# Patient Record
Sex: Male | Born: 1960 | Race: Black or African American | Hispanic: No | Smoking: Current every day smoker
Health system: Southern US, Community
[De-identification: ages and names within clinical notes are randomized; demographics above are authoritative.]

## PROBLEM LIST (undated history)

## (undated) DIAGNOSIS — I10 Essential (primary) hypertension: Secondary | ICD-10-CM

## (undated) DIAGNOSIS — F1910 Other psychoactive substance abuse, uncomplicated: Secondary | ICD-10-CM

## (undated) DIAGNOSIS — J939 Pneumothorax, unspecified: Secondary | ICD-10-CM

## (undated) HISTORY — DX: Pneumothorax, unspecified: J93.9

## (undated) HISTORY — DX: Other psychoactive substance abuse, uncomplicated: F19.10

## (undated) HISTORY — PX: APPENDECTOMY: SHX54

---

## 2009-09-18 ENCOUNTER — Emergency Department: Payer: Self-pay

## 2010-07-21 ENCOUNTER — Emergency Department: Payer: Self-pay | Admitting: Emergency Medicine

## 2012-12-19 ENCOUNTER — Emergency Department: Payer: Self-pay | Admitting: Emergency Medicine

## 2012-12-19 LAB — CBC
HCT: 42.1 % (ref 40.0–52.0)
MCH: 35.6 pg — ABNORMAL HIGH (ref 26.0–34.0)
MCHC: 35.7 g/dL (ref 32.0–36.0)
MCV: 100 fL (ref 80–100)
RBC: 4.22 10*6/uL — ABNORMAL LOW (ref 4.40–5.90)
RDW: 12.2 % (ref 11.5–14.5)
WBC: 6.2 10*3/uL (ref 3.8–10.6)

## 2012-12-19 LAB — ETHANOL: Ethanol %: 0.356 % (ref 0.000–0.080)

## 2012-12-19 LAB — URINALYSIS, COMPLETE
Bacteria: NONE SEEN
Bilirubin,UR: NEGATIVE
Glucose,UR: NEGATIVE mg/dL (ref 0–75)
Ketone: NEGATIVE
Leukocyte Esterase: NEGATIVE
Nitrite: NEGATIVE
Ph: 7 (ref 4.5–8.0)
Protein: NEGATIVE
Specific Gravity: 1.014 (ref 1.003–1.030)

## 2012-12-19 LAB — COMPREHENSIVE METABOLIC PANEL
Albumin: 4.2 g/dL (ref 3.4–5.0)
Alkaline Phosphatase: 110 U/L (ref 50–136)
Anion Gap: 5 — ABNORMAL LOW (ref 7–16)
Bilirubin,Total: 1.8 mg/dL — ABNORMAL HIGH (ref 0.2–1.0)
Calcium, Total: 8.7 mg/dL (ref 8.5–10.1)
Chloride: 110 mmol/L — ABNORMAL HIGH (ref 98–107)
Co2: 29 mmol/L (ref 21–32)
EGFR (African American): >60
Glucose: 93 mg/dL (ref 65–99)
Potassium: 4.1 mmol/L (ref 3.5–5.1)
Sodium: 144 mmol/L (ref 136–145)
Total Protein: 8.3 g/dL — ABNORMAL HIGH (ref 6.4–8.2)

## 2012-12-19 LAB — DRUG SCREEN, URINE
Barbiturates, Ur Screen: NEGATIVE (ref ?–200)
Cannabinoid 50 Ng, Ur ~~LOC~~: NEGATIVE (ref ?–50)
Cocaine Metabolite,Ur ~~LOC~~: NEGATIVE (ref ?–300)
MDMA (Ecstasy)Ur Screen: NEGATIVE (ref ?–500)
Methadone, Ur Screen: NEGATIVE (ref ?–300)

## 2012-12-19 LAB — TSH: Thyroid Stimulating Horm: 3.38 u[IU]/mL

## 2014-01-26 ENCOUNTER — Emergency Department: Payer: Self-pay | Admitting: Emergency Medicine

## 2014-03-28 ENCOUNTER — Emergency Department: Payer: Self-pay | Admitting: Emergency Medicine

## 2014-03-28 LAB — COMPREHENSIVE METABOLIC PANEL
ALBUMIN: 3.8 g/dL (ref 3.4–5.0)
ALK PHOS: 153 U/L — AB
Anion Gap: 9 (ref 7–16)
BILIRUBIN TOTAL: 0.7 mg/dL (ref 0.2–1.0)
BUN: 7 mg/dL (ref 7–18)
CO2: 27 mmol/L (ref 21–32)
Calcium, Total: 8.5 mg/dL (ref 8.5–10.1)
Chloride: 104 mmol/L (ref 98–107)
Creatinine: 0.97 mg/dL (ref 0.60–1.30)
EGFR (African American): >60
EGFR (Non-African Amer.): >60
Glucose: 104 mg/dL — ABNORMAL HIGH (ref 65–99)
Osmolality: 278 (ref 275–301)
POTASSIUM: 4.1 mmol/L (ref 3.5–5.1)
SGOT(AST): 118 U/L — ABNORMAL HIGH (ref 15–37)
SGPT (ALT): 106 U/L — ABNORMAL HIGH
Sodium: 140 mmol/L (ref 136–145)
Total Protein: 7.9 g/dL (ref 6.4–8.2)

## 2014-03-28 LAB — ACETAMINOPHEN LEVEL: Acetaminophen: <2 ug/mL

## 2014-03-28 LAB — CBC
HCT: 39.5 % — ABNORMAL LOW (ref 40.0–52.0)
HGB: 13.2 g/dL (ref 13.0–18.0)
MCH: 34.6 pg — ABNORMAL HIGH (ref 26.0–34.0)
MCHC: 33.3 g/dL (ref 32.0–36.0)
MCV: 104 fL — ABNORMAL HIGH (ref 80–100)
Platelet: 392 10*3/uL (ref 150–440)
RBC: 3.8 10*6/uL — ABNORMAL LOW (ref 4.40–5.90)
RDW: 14 % (ref 11.5–14.5)
WBC: 5.8 10*3/uL (ref 3.8–10.6)

## 2014-03-28 LAB — URINALYSIS, COMPLETE
BILIRUBIN, UR: NEGATIVE
Bacteria: NONE SEEN
Blood: NEGATIVE
Glucose,UR: NEGATIVE mg/dL (ref 0–75)
KETONE: NEGATIVE
Leukocyte Esterase: NEGATIVE
Nitrite: NEGATIVE
PH: 6 (ref 4.5–8.0)
Protein: NEGATIVE
RBC,UR: 3 /HPF (ref 0–5)
Specific Gravity: 1.021 (ref 1.003–1.030)
Squamous Epithelial: <1
WBC UR: <1 /HPF (ref 0–5)

## 2014-03-28 LAB — DRUG SCREEN, URINE

## 2014-03-28 LAB — ETHANOL: Ethanol: <3 mg/dL

## 2014-03-28 LAB — SALICYLATE LEVEL: Salicylates, Serum: 1.8 mg/dL

## 2014-09-27 ENCOUNTER — Encounter: Payer: Self-pay | Admitting: Emergency Medicine

## 2014-09-27 ENCOUNTER — Emergency Department
Admission: EM | Admit: 2014-09-27 | Discharge: 2014-09-27 | Disposition: A | Discharge status: Home / Self Care | Payer: Self-pay | Attending: Emergency Medicine | Admitting: Emergency Medicine

## 2014-09-27 DIAGNOSIS — Z0289 Encounter for other administrative examinations: Secondary | ICD-10-CM | POA: Insufficient documentation

## 2014-09-27 DIAGNOSIS — Z720 Tobacco use: Secondary | ICD-10-CM | POA: Insufficient documentation

## 2014-09-27 DIAGNOSIS — Z139 Encounter for screening, unspecified: Secondary | ICD-10-CM

## 2014-09-27 HISTORY — DX: Essential (primary) hypertension: I10
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
## 2014-09-27 NOTE — Discharge Instructions (Signed)
Medical Screening Exam °A medical screening exam has been done. This exam helps find the cause of your problem and determines whether you need emergency treatment. Your exam has shown that you do not need emergency treatment at this point. It is safe for you to go to your caregiver's office or clinic for treatment. You should make an appointment today to see your caregiver as soon as he or she is available. °Depending on your illness, your symptoms and condition can change over time. If your condition gets worse or you develop new or troubling symptoms before you see your caregiver, you should return to the emergency department for further evaluation.  °Document Released: 06/09/2004 Document Revised: 07/25/2011 Document Reviewed: 01/19/2011 °Exit[employer]® Patient Information ©2015 [employer]. This information is not intended to replace advice given to you by your health care provider. Make sure you discuss any questions you have with your health care provider. ° °

## 2014-09-27 NOTE — ED Notes (Signed)
Pt placed in room with [place] [employer] present at bedside.  He is calm and cooperative towards ER staff so far but repeatedly antagonizing the supervising officer.

## 2014-09-27 NOTE — ED Notes (Signed)
Pt unable to sign e-document. Noted on the e-signature page. Pt in handcuffs in custody of [place] police. Pt read the discharge on medical clearance for jail. Pt denies pain and has no concerns or questions. Pt ambulated to exit with police with steady gait.

## 2014-09-27 NOTE — ED Notes (Signed)
Pt brought in by [place] [employer] for medical clearance for incarceration.

## 2014-09-27 NOTE — ED Notes (Signed)
Pt questioned if he has pain, states clearly, "Only my wrists because they won't take my handcuffs off", gait steady with officer

## 2014-10-23 ENCOUNTER — Encounter: Payer: Self-pay | Admitting: Emergency Medicine

## 2014-10-23 ENCOUNTER — Inpatient Hospital Stay
Admission: EM | Admit: 2014-10-23 | Discharge: 2014-11-03 | DRG: 885 | Disposition: A | Discharge status: Home / Self Care | Payer: No Typology Code available for payment source | Source: Intra-hospital | Attending: Psychiatry | Admitting: Psychiatry

## 2014-10-23 ENCOUNTER — Emergency Department
Admission: EM | Admit: 2014-10-23 | Discharge: 2014-10-23 | Disposition: A | Discharge status: Other Acute Inpatient Hospital | Payer: Medicaid Other | Attending: Emergency Medicine | Admitting: Emergency Medicine

## 2014-10-23 DIAGNOSIS — F1020 Alcohol dependence, uncomplicated: Secondary | ICD-10-CM | POA: Diagnosis present

## 2014-10-23 DIAGNOSIS — F322 Major depressive disorder, single episode, severe without psychotic features: Secondary | ICD-10-CM | POA: Diagnosis present

## 2014-10-23 DIAGNOSIS — F32A Depression, unspecified: Secondary | ICD-10-CM

## 2014-10-23 DIAGNOSIS — Z720 Tobacco use: Secondary | ICD-10-CM | POA: Insufficient documentation

## 2014-10-23 DIAGNOSIS — F17200 Nicotine dependence, unspecified, uncomplicated: Secondary | ICD-10-CM

## 2014-10-23 DIAGNOSIS — Z560 Unemployment, unspecified: Secondary | ICD-10-CM | POA: Diagnosis present

## 2014-10-23 DIAGNOSIS — E119 Type 2 diabetes mellitus without complications: Secondary | ICD-10-CM | POA: Insufficient documentation

## 2014-10-23 DIAGNOSIS — F17210 Nicotine dependence, cigarettes, uncomplicated: Secondary | ICD-10-CM | POA: Diagnosis present

## 2014-10-23 DIAGNOSIS — Z590 Homelessness: Secondary | ICD-10-CM | POA: Diagnosis not present

## 2014-10-23 DIAGNOSIS — I10 Essential (primary) hypertension: Secondary | ICD-10-CM | POA: Insufficient documentation

## 2014-10-23 DIAGNOSIS — G4700 Insomnia, unspecified: Secondary | ICD-10-CM | POA: Diagnosis present

## 2014-10-23 DIAGNOSIS — L309 Dermatitis, unspecified: Secondary | ICD-10-CM

## 2014-10-23 DIAGNOSIS — F10239 Alcohol dependence with withdrawal, unspecified: Secondary | ICD-10-CM | POA: Diagnosis present

## 2014-10-23 DIAGNOSIS — F323 Major depressive disorder, single episode, severe with psychotic features: Secondary | ICD-10-CM | POA: Diagnosis not present

## 2014-10-23 DIAGNOSIS — K5900 Constipation, unspecified: Secondary | ICD-10-CM | POA: Diagnosis present

## 2014-10-23 DIAGNOSIS — F10939 Alcohol use, unspecified with withdrawal, unspecified: Secondary | ICD-10-CM | POA: Diagnosis present

## 2014-10-23 DIAGNOSIS — R45851 Suicidal ideations: Secondary | ICD-10-CM | POA: Diagnosis present

## 2014-10-23 DIAGNOSIS — R45850 Homicidal ideations: Secondary | ICD-10-CM | POA: Diagnosis present

## 2014-10-23 DIAGNOSIS — A150 Tuberculosis of lung: Secondary | ICD-10-CM

## 2014-10-23 DIAGNOSIS — F329 Major depressive disorder, single episode, unspecified: Secondary | ICD-10-CM | POA: Insufficient documentation

## 2014-10-23 LAB — COMPREHENSIVE METABOLIC PANEL
ALBUMIN: 4.4 g/dL (ref 3.5–5.0)
ALT: 75 U/L — ABNORMAL HIGH (ref 17–63)
ANION GAP: 16 — AB (ref 5–15)
AST: 145 U/L — ABNORMAL HIGH (ref 15–41)
Alkaline Phosphatase: 122 U/L (ref 38–126)
BILIRUBIN TOTAL: 2.6 mg/dL — AB (ref 0.3–1.2)
BUN: 9 mg/dL (ref 6–20)
CHLORIDE: 97 mmol/L — AB (ref 101–111)
CO2: 23 mmol/L (ref 22–32)
CREATININE: 0.74 mg/dL (ref 0.61–1.24)
Calcium: 9.1 mg/dL (ref 8.9–10.3)
GFR calc Af Amer: >60 mL/min (ref >60)
GFR calc non Af Amer: >60 mL/min (ref >60)
Glucose, Bld: 72 mg/dL (ref 65–99)
Potassium: 4 mmol/L (ref 3.5–5.1)
SODIUM: 136 mmol/L (ref 135–145)
Total Protein: 7.6 g/dL (ref 6.5–8.1)

## 2014-10-23 LAB — CBC
HCT: 40 % (ref 40.0–52.0)
Hemoglobin: 13.8 g/dL (ref 13.0–18.0)
MCH: 32.3 pg (ref 26.0–34.0)
MCHC: 34.5 g/dL (ref 32.0–36.0)
MCV: 93.6 fL (ref 80.0–100.0)
PLATELETS: 136 10*3/uL — AB (ref 150–440)
RBC: 4.27 MIL/uL — AB (ref 4.40–5.90)
RDW: 14 % (ref 11.5–14.5)
WBC: 6.8 10*3/uL (ref 3.8–10.6)

## 2014-10-23 LAB — URINE DRUG SCREEN, QUALITATIVE (ARMC ONLY)
Amphetamines, Ur Screen: NOT DETECTED
BARBITURATES, UR SCREEN: NOT DETECTED
BENZODIAZEPINE, UR SCRN: NOT DETECTED
Cannabinoid 50 Ng, Ur ~~LOC~~: NOT DETECTED
Cocaine Metabolite,Ur ~~LOC~~: NOT DETECTED
MDMA (ECSTASY) UR SCREEN: NOT DETECTED
Methadone Scn, Ur: NOT DETECTED
Opiate, Ur Screen: NOT DETECTED
Phencyclidine (PCP) Ur S: NOT DETECTED
Tricyclic, Ur Screen: NOT DETECTED

## 2014-10-23 LAB — URINALYSIS COMPLETE WITH MICROSCOPIC (ARMC ONLY)
Bacteria, UA: NONE SEEN
Bilirubin Urine: NEGATIVE
Glucose, UA: NEGATIVE mg/dL
LEUKOCYTES UA: NEGATIVE
NITRITE: NEGATIVE
Protein, ur: NEGATIVE mg/dL
SPECIFIC GRAVITY, URINE: 1.009 (ref 1.005–1.030)
pH: 6 (ref 5.0–8.0)

## 2014-10-23 LAB — ACETAMINOPHEN LEVEL: Acetaminophen (Tylenol), Serum: <10 ug/mL — ABNORMAL LOW (ref 10–30)

## 2014-10-23 LAB — ETHANOL: ALCOHOL ETHYL (B): 63 mg/dL — AB (ref <5)

## 2014-10-23 LAB — SALICYLATE LEVEL

## 2014-10-23 MED ORDER — NICOTINE 10 MG IN INHA
1.0000 | RESPIRATORY_TRACT | Status: DC | PRN
  Administered 2014-10-23: 1 via RESPIRATORY_TRACT
  Filled 2014-10-23: qty 36

## 2014-10-23 MED ORDER — CHLORDIAZEPOXIDE HCL 25 MG PO CAPS
ORAL_CAPSULE | ORAL | Status: AC
  Filled 2014-10-23: qty 2

## 2014-10-23 MED ORDER — VITAMIN B-1 100 MG PO TABS
100.0000 mg | ORAL_TABLET | ORAL | Status: AC
  Administered 2014-10-23: 100 mg via ORAL
  Filled 2014-10-23: qty 1

## 2014-10-23 MED ORDER — VITAMIN B-1 100 MG PO TABS
ORAL_TABLET | ORAL | Status: AC
  Filled 2014-10-23: qty 1

## 2014-10-23 MED ORDER — ACETAMINOPHEN 325 MG PO TABS: 650.0000 mg | ORAL_TABLET | Freq: Four times a day (QID) | ORAL | Status: DC | PRN

## 2014-10-23 MED ORDER — VITAMIN B-1 100 MG PO TABS
100.0000 mg | ORAL_TABLET | ORAL | Status: DC
  Administered 2014-10-23: 100 mg via ORAL

## 2014-10-23 MED ORDER — MAGNESIUM HYDROXIDE 400 MG/5ML PO SUSP: 30.0000 mL | Freq: Every day | ORAL | Status: DC | PRN

## 2014-10-23 MED ORDER — CITALOPRAM HYDROBROMIDE 20 MG PO TABS
20.0000 mg | ORAL_TABLET | Freq: Every day | ORAL | Status: DC
  Administered 2014-10-23 – 2014-10-25 (×3): 20 mg via ORAL
  Filled 2014-10-23 (×3): qty 1

## 2014-10-23 MED ORDER — LORAZEPAM 2 MG PO TABS
2.0000 mg | ORAL_TABLET | ORAL | Status: DC | PRN
  Administered 2014-10-23: 2 mg via ORAL
  Filled 2014-10-23: qty 1

## 2014-10-23 MED ORDER — CHLORDIAZEPOXIDE HCL 25 MG PO CAPS
50.0000 mg | ORAL_CAPSULE | ORAL | Status: AC
  Administered 2014-10-23: 50 mg via ORAL

## 2014-10-23 MED ORDER — ALUM & MAG HYDROXIDE-SIMETH 200-200-20 MG/5ML PO SUSP: 30.0000 mL | ORAL | Status: DC | PRN

## 2014-10-23 NOTE — ED Provider Notes (Signed)
[facility] Emergency Department Provider Note   ____________________________________________  Time seen: 1220  I have reviewed the triage vital signs and the nursing notes.   HISTORY  Chief Complaint Depression   History limited by: Not Limited   HPI Matthew [name] is a 54 y.o. male who presents to the emergency department under IVC for concerns for depression and suicidal ideation. The patient states that he feels like he is hurt everybody he looks. He states that he wants to jump off a bridge in a traffic. He has had decreased appetite and insomnia. He states he has been drinking alcohol. Denies any fevers, chest pain or shortness of breath.     Past Medical History  Diagnosis Date  . Hypertension   . Diabetes mellitus without complication     There are no active problems to display for this patient.   History reviewed. No pertinent past surgical history.  No current outpatient prescriptions on file.  Allergies Review of patient's allergies indicates no known allergies.  No family history on file.  Social History History  Substance Use Topics  . Smoking status: Current Every Day Smoker -- 1.00 packs/day for 35 years  . Smokeless tobacco: Never Used  . Alcohol Use: 14.4 oz/week    24 Cans of beer per week    Review of Systems  Constitutional: Negative for fever. Cardiovascular: Negative for chest pain. Respiratory: Negative for shortness of breath. Gastrointestinal: Negative for abdominal pain, vomiting and diarrhea. Genitourinary: Negative for dysuria. Musculoskeletal: Negative for back pain. Skin: Negative for rash. Neurological: Negative for headaches, focal weakness or numbness. Psychiatric:Suicidal ideation, depression   10-point ROS otherwise negative.  ____________________________________________   PHYSICAL EXAM:  VITAL SIGNS: ED Triage Vitals  Enc Vitals Group     BP 10/23/14 1120 162/78 mmHg     Pulse Rate  10/23/14 1120 107     Resp 10/23/14 1120 18     Temp 10/23/14 1120 98.7 F (37.1 C)     Temp Source 10/23/14 1120 Oral     SpO2 10/23/14 1120 95 %     Weight 10/23/14 1120 163 lb (73.936 kg)     Height 10/23/14 1120 5\' 10"  (1.778 m)   Constitutional: Alert and oriented. Tearful Eyes: Conjunctivae are normal. PERRL. Normal extraocular movements. ENT   Head: Normocephalic and atraumatic.   Nose: No congestion/rhinnorhea.   Mouth/Throat: Mucous membranes are moist.   Neck: No stridor. Hematological/Lymphatic/Immunilogical: No cervical lymphadenopathy. Cardiovascular: Normal rate, regular rhythm.  No murmurs, rubs, or gallops. Respiratory: Normal respiratory effort without tachypnea nor retractions. Breath sounds are clear and equal bilaterally. No wheezes/rales/rhonchi. Gastrointestinal: Soft and nontender. No distention.  Genitourinary: Deferred Musculoskeletal: Normal range of motion in all extremities. No joint effusions.  No lower extremity tenderness nor edema. Neurologic:  Normal speech and language. No gross focal neurologic deficits are appreciated. Speech is normal.  Skin:  Skin is warm, dry and intact. No rash noted. Psychiatric: Tearful, endorses SI  ____________________________________________    LABS (pertinent positives/negatives) Labs Reviewed  CBC - Abnormal; Notable for the following:    RBC 4.27 (*)    Platelets 136 (*)    All other components within normal limits  COMPREHENSIVE METABOLIC PANEL - Abnormal; Notable for the following:    Chloride 97 (*)    AST 145 (*)    ALT 75 (*)    Total Bilirubin 2.6 (*)    Anion gap 16 (*)    All other components within normal limits  ETHANOL - Abnormal; Notable for the following:    Alcohol, Ethyl (B) 63 (*)    All other components within normal limits  ACETAMINOPHEN LEVEL - Abnormal; Notable for the following:    Acetaminophen (Tylenol), Serum <10 (*)    All other components within normal limits   URINALYSIS COMPLETEWITH MICROSCOPIC ([facility] ONLY) - Abnormal; Notable for the following:    Color, Urine YELLOW (*)    APPearance CLEAR (*)    Ketones, ur 1+ (*)    Hgb urine dipstick 2+ (*)    Squamous Epithelial / LPF 0-5 (*)    All other components within normal limits  SALICYLATE LEVEL  URINE DRUG SCREEN, QUALITATIVE ([facility] ONLY)     ____________________________________________   EKG  None  ____________________________________________    RADIOLOGY  None  ____________________________________________   PROCEDURES  Procedure(s) performed: None  Critical Care performed: No  ____________________________________________   INITIAL IMPRESSION / ASSESSMENT AND PLAN / ED COURSE  Pertinent labs & imaging results that were available during my care of the patient were reviewed by me and considered in my medical decision making (see chart for details).  Patient presents under IVC for depression and suicidal ideation. On exam patient extremely tearful, depressed and does endorse suicidal ideation with plan to jump off of a bridge.  ----------------------------------------- 3:24 PM on 10/23/2014 -----------------------------------------  Lab work has returned. LFTs total bili slightly elevated. Patient had no abdominal tenderness in my exam. Will recheck in the a.m. ____________________________________________   FINAL CLINICAL IMPRESSION(S) / ED DIAGNOSES  Final diagnoses:  Depression     Phineas Semen, MD 10/23/14 1526

## 2014-10-23 NOTE — ED Notes (Signed)
Pt transferred into ED BHU room 5    Patient assigned to appropriate care area. Patient oriented to unit/care area: Informed that, for their safety, care areas are designed for safety and monitored by security cameras at all times; Visiting hours and phone times explained to patient. Patient verbalizes understanding, and verbal contract for safety obtained.     

## 2014-10-23 NOTE — ED Notes (Signed)
Sent in from [facility] under IVC for depression and s/i

## 2014-10-23 NOTE — ED Notes (Signed)
BEHAVIORAL HEALTH ROUNDING Patient sleeping: No. Patient alert and oriented: yes Behavior appropriate: Yes.  ; If no, describe:  Nutrition and fluids offered: yes Toileting and hygiene offered: Yes  Sitter present: q15 minute observations and security camera monitoring Law enforcement present: Yes  ODS  

## 2014-10-23 NOTE — Tx Team (Signed)
Initial Interdisciplinary Treatment Plan   PATIENT STRESSORS: Financial difficulties Legal issue Substance abuse   PATIENT STRENGTHS: Ability for insight Average or above average intelligence Capable of independent living   PROBLEM LIST: Problem List/Patient Goals Date to be addressed Date deferred Reason deferred Estimated date of resolution  depression      ETOH abuse                                                 DISCHARGE CRITERIA:  Ability to meet basic life and health needs Adequate post-discharge living arrangements Improved stabilization in mood, thinking, and/or behavior  PRELIMINARY DISCHARGE PLAN: Attend aftercare/continuing care group Attend 12-step recovery group Outpatient therapy  PATIENT/FAMIILY INVOLVEMENT: This treatment plan has been presented to and reviewed with the patient, Matthew [name], and/or family member.The patient and family have been given the opportunity to ask questions and make suggestions.  [name] 10/23/2014, 6:57 PM

## 2014-10-23 NOTE — ED Notes (Signed)
Staying with a friend past 2 weeks, has been at [facility] for etoh abuse, thoughts of hurting himself drank some chlorox, thoughts of selfinflicted lac,, last etoh last pm,, came from mental clinic, states had court date today concerning DWI

## 2014-10-23 NOTE — Progress Notes (Signed)
Admitted ambulatory from ED, a 54 yo male with a diagnosis of depression and ETOH abuse. Patient presented to ED with complaints of suicidal ideation with several plans to try to kill himself. Patient is alert and oriented x 4. His mood is sad with congruent affect. No evidence of psychotic thinking. Speech is clear and coherent; thoughts are organized and linear.  Patient has long history of alcohol abuse and dependence. Denies significant withdrawal symptoms. He was placed on a CIWA per MD order. See admission database for complete clinical data. Patient was cooperative with all nursing interventions. A search was completed, there was no contraband found. Verifying nurse was Marylu Lund, Charity fundraiser.

## 2014-10-23 NOTE — ED Notes (Signed)

## 2014-10-23 NOTE — ED Notes (Signed)
Supper provided  [employer] consulting at this time

## 2014-10-23 NOTE — ED Notes (Signed)
Pt to be admitted to [facility] BMU

## 2014-10-23 NOTE — ED Notes (Signed)

## 2014-10-23 NOTE — ED Notes (Signed)
Report given to Chaseton Yepiz RN  -- pt to transfer with [facility] and [employer]  1/1 bags of belongings transferred with the pt  Pt observed with no unusual behavior  Appropriate to stimulation  No verbalized needs or concerns at this time  NAD assessed  Continue to monitor

## 2014-10-23 NOTE — Consult Note (Signed)
[facility] Face-to-Face Psychiatry Consult   Reason for Consult:  Consult for 54 year old man with a history of alcohol abuse. Presents to the hospital with a chief complaint "I'm feeling suicidal". Referring Physician:  Archie Balboa Patient Identification: Matthew [name] MRN:  [id number] Principal Diagnosis: Major psychotic depression, single episode Diagnosis:   Patient Active Problem List   Diagnosis Date Noted  . 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 Alcohol abuse [F10.10] 10/23/2014  . Homeless [Z59.0] 10/23/2014  . Alcohol withdrawal [F10.239] 10/23/2014    Total Time spent with patient: 1 hour  Subjective:   Matthew [name] is a 54 y.o. male patient admitted with patient presented to the hospital voluntarily stating that he was having suicidal thoughts. Described 2 weeks of being extremely depressed with now thoughts of killing himself by walking out in traffic.Marland Kitchen  HPI:  Information from the patient the current chart and the old chart. Patient states that he's been feeling depressed ever since he got out of jail which was a couple weeks ago. He has lost his job and lost his place to live. He had previously been staying at our [facility] when he relapsed and wound up in jail for a few weeks. Now he no longer has a place to stay and no longer has his supported job. He's been staying at the home of a friend. He does very little during the day. Mood is feeling very depressed. Sad all the time. Difficulty sleeping at night both falling asleep and maintaining sleep. Appetite poor and has not been eating for several days. He's been drinking heavily. Estimate ranges from 2 40 ounce beers a day to as much as a fifth of liquor a day at times. Denies that he is abusing any other drugs. Not getting any outpatient psychiatric treatment. Has started having thoughts about killing himself by walking out in traffic or cutting himself. No homicidal ideation. Not having psychotic symptoms. Past psychiatric  history: Patient denies any psychiatric treatment in the past for anything other than substance abuse. Never been on psychiatric medicine. No history of suicide attempts or psychiatric hospitalization. On history of alcohol abuse. Has been able to maintain sobriety for up to 6 weeks in the past.  Social history: He had recently been staying at our [facility] and estimates that he had been there for 6 months. He relapsed and so he is not able to go back there at this time. Patient evidently still has legal charges and problems in front of him although he's out of jail. He does have extended family as well as children of his own but has not been in touch with him because of his embarrassment about his situation.  Medical history: Denies any medical problems. No history of high blood pressure diabetes or heart disease.  Family history: Nonidentified  Current medications none  Substance abuse history: Long history of drinking. He has never had a seizure or delirium tremens coming off of alcohol. Does get shaky at times. Has been in long-term rehabilitation at our [facility].  HPI Elements:   Quality:  Depressed mood and sadness and suicidal ideation. Severity:  Severe. Timing:  The past 2 weeks.. Duration:  Ongoing and getting worse over the last few days. Context:  Relapse into heavy drinking loss of major supports in his life.  Past Medical History:  Past Medical History  Diagnosis Date  . Hypertension   . Diabetes mellitus without complication    History reviewed. No pertinent past surgical history. Family History:  No family history on file. Social History:  History  Alcohol Use  . 14.4 oz/week  . 24 Cans of beer per week     History  Drug Use No    History   Social History  . Marital Status: Divorced    Spouse Name: N/A  . Number of Children: N/A  . Years of Education: N/A   Social History Main Topics  . Smoking status: Current Every Day Smoker -- 1.00 packs/day for 35 years  . Smokeless  tobacco: Never Used  . Alcohol Use: 14.4 oz/week    24 Cans of beer per week  . Drug Use: No  . Sexual Activity: Not Currently   Other Topics Concern  . None   Social History Narrative   Additional Social History:    Pain Medications: None Reported Prescriptions: None Reported Over the Counter: None Reported History of alcohol / drug use?: Yes Longest period of sobriety (when/how long): 7 months Negative Consequences of Use: Scientist, research (physical sciences), Financial, Personal relationships, Work / School Withdrawal Symptoms: Sweats, Nausea / Vomiting, Tingling, Fever / Chills Name of Substance 1: Alcohol 1 - Age of First Use: 18 1 - Amount (size/oz): 1 pint to a fifth and a couple of beers (2-40oz) 1 - Frequency: Daily 1 - Duration: Consistently for approximately 3 months 1 - Last Use / Amount: 10/22/2014                   Allergies:  No Known Allergies  Labs:  Results for orders placed or performed during the hospital encounter of 10/23/14 (from the past 48 hour(s))  CBC     Status: Abnormal   Collection Time: 10/23/14 11:28 AM  Result Value Ref Range   WBC 6.8 3.8 - 10.6 K/uL   RBC 4.27 (L) 4.40 - 5.90 MIL/uL   Hemoglobin 13.8 13.0 - 18.0 g/dL   HCT 40.0 40.0 - 52.0 %   MCV 93.6 80.0 - 100.0 fL   MCH 32.3 26.0 - 34.0 pg   MCHC 34.5 32.0 - 36.0 g/dL   RDW 14.0 11.5 - 14.5 %   Platelets 136 (L) 150 - 440 K/uL  Comprehensive metabolic panel     Status: Abnormal   Collection Time: 10/23/14 11:28 AM  Result Value Ref Range   Sodium 136 135 - 145 mmol/L   Potassium 4.0 3.5 - 5.1 mmol/L   Chloride 97 (L) 101 - 111 mmol/L   CO2 23 22 - 32 mmol/L   Glucose, Bld 72 65 - 99 mg/dL   BUN 9 6 - 20 mg/dL   Creatinine, Ser 0.74 0.61 - 1.24 mg/dL   Calcium 9.1 8.9 - 10.3 mg/dL   Total Protein 7.6 6.5 - 8.1 g/dL   Albumin 4.4 3.5 - 5.0 g/dL   AST 145 (H) 15 - 41 U/L   ALT 75 (H) 17 - 63 U/L   Alkaline Phosphatase 122 38 - 126 U/L   Total Bilirubin 2.6 (H) 0.3 - 1.2 mg/dL   GFR calc non Af  Amer >60 >60 mL/min   GFR calc Af Amer >60 >60 mL/min    Comment: (NOTE) The eGFR has been calculated using the CKD EPI equation. This calculation has not been validated in all clinical situations. eGFR's persistently <60 mL/min signify possible Chronic Kidney Disease.    Anion gap 16 (H) 5 - 15  Ethanol (ETOH)     Status: Abnormal   Collection Time: 10/23/14 11:28 AM  Result Value Ref Range   Alcohol,  Ethyl (B) 63 (H) <5 mg/dL    Comment:        LOWEST DETECTABLE LIMIT FOR SERUM ALCOHOL IS 5 mg/dL FOR MEDICAL PURPOSES ONLY   Acetaminophen level     Status: Abnormal   Collection Time: 10/23/14 11:28 AM  Result Value Ref Range   Acetaminophen (Tylenol), Serum <10 (L) 10 - 30 ug/mL    Comment:        THERAPEUTIC CONCENTRATIONS VARY SIGNIFICANTLY. A RANGE OF 10-30 ug/mL MAY BE AN EFFECTIVE CONCENTRATION FOR MANY PATIENTS. HOWEVER, SOME ARE BEST TREATED AT CONCENTRATIONS OUTSIDE THIS RANGE. ACETAMINOPHEN CONCENTRATIONS >150 ug/mL AT 4 HOURS AFTER INGESTION AND >50 ug/mL AT 12 HOURS AFTER INGESTION ARE OFTEN ASSOCIATED WITH TOXIC REACTIONS.   Salicylate level     Status: None   Collection Time: 10/23/14 11:28 AM  Result Value Ref Range   Salicylate Lvl <4.0 2.8 - 30.0 mg/dL  Urine Drug Screen, Qualitative ([facility] only)     Status: None   Collection Time: 10/23/14 11:28 AM  Result Value Ref Range   Tricyclic, Ur Screen NONE DETECTED NONE DETECTED   Amphetamines, Ur Screen NONE DETECTED NONE DETECTED   MDMA (Ecstasy)Ur Screen NONE DETECTED NONE DETECTED   Cocaine Metabolite,Ur [place] NONE DETECTED NONE DETECTED   Opiate, Ur Screen NONE DETECTED NONE DETECTED   Phencyclidine (PCP) Ur S NONE DETECTED NONE DETECTED   Cannabinoid 50 Ng, Ur [place] NONE DETECTED NONE DETECTED   Barbiturates, Ur Screen NONE DETECTED NONE DETECTED   Benzodiazepine, Ur Scrn NONE DETECTED NONE DETECTED   Methadone Scn, Ur NONE DETECTED NONE DETECTED    Comment: (NOTE) 814  Tricyclics, urine                Cutoff 1000 ng/mL 200  Amphetamines, urine             Cutoff 1000 ng/mL 300  MDMA (Ecstasy), urine           Cutoff 500 ng/mL 400  Cocaine Metabolite, urine       Cutoff 300 ng/mL 500  Opiate, urine                   Cutoff 300 ng/mL 600  Phencyclidine (PCP), urine      Cutoff 25 ng/mL 700  Cannabinoid, urine              Cutoff 50 ng/mL 800  Barbiturates, urine             Cutoff 200 ng/mL 900  Benzodiazepine, urine           Cutoff 200 ng/mL 1000 Methadone, urine                Cutoff 300 ng/mL 1100 1200 The urine drug screen provides only a preliminary, unconfirmed 1300 analytical test result and should not be used for non-medical 1400 purposes. Clinical consideration and professional judgment should 1500 be applied to any positive drug screen result due to possible 1600 interfering substances. A more specific alternate chemical method 1700 must be used in order to obtain a confirmed analytical result.  1800 Gas chromato graphy / mass spectrometry (GC/MS) is the preferred 1900 confirmatory method.   Urinalysis complete, with microscopic ([facility] only)     Status: Abnormal   Collection Time: 10/23/14 11:28 AM  Result Value Ref Range   Color, Urine YELLOW (A) YELLOW   APPearance CLEAR (A) CLEAR   Glucose, UA NEGATIVE NEGATIVE mg/dL   Bilirubin Urine NEGATIVE NEGATIVE   Ketones, ur  1+ (A) NEGATIVE mg/dL   Specific Gravity, Urine 1.009 1.005 - 1.030   Hgb urine dipstick 2+ (A) NEGATIVE   pH 6.0 5.0 - 8.0   Protein, ur NEGATIVE NEGATIVE mg/dL   Nitrite NEGATIVE NEGATIVE   Leukocytes, UA NEGATIVE NEGATIVE   RBC / HPF 0-5 0 - 5 RBC/hpf   WBC, UA 0-5 0 - 5 WBC/hpf   Bacteria, UA NONE SEEN NONE SEEN   Squamous Epithelial / LPF 0-5 (A) NONE SEEN   Mucous PRESENT     Vitals: Blood pressure 162/78, pulse 107, temperature 98.7 F (37.1 C), temperature source Oral, resp. rate 18, height '5\' 10"'  (1.778 m), weight 73.936 kg (163 lb), SpO2 95 %.  Risk to Self: Is patient at risk for  suicide?: Yes Risk to Others:   Prior Inpatient Therapy:   Prior Outpatient Therapy:    No current facility-administered medications for this encounter.   No current outpatient prescriptions on file.    Musculoskeletal: Strength & Muscle Tone: within normal limits Gait & Station: normal Patient leans: N/A  Psychiatric Specialty Exam: Physical Exam  Constitutional: He appears well-developed and well-nourished.  HENT:  Head: Normocephalic and atraumatic.  Eyes: Conjunctivae are normal. Pupils are equal, round, and reactive to light.  Neck: Normal range of motion.  Cardiovascular: Normal heart sounds.   Respiratory: Effort normal.  GI: Soft.  Musculoskeletal: Normal range of motion.  Neurological: He is alert.  Skin: Skin is warm and dry.  Psychiatric: His mood appears anxious. His speech is delayed. He is withdrawn. Cognition and memory are normal. He expresses impulsivity. He exhibits a depressed mood. He expresses suicidal ideation. He expresses suicidal plans.  Patient appears to be a bit sedated but is alert and communicative and able to give a good history. Feeling depressed. Having suicidal thoughts.    Review of Systems  Constitutional: Negative.   HENT: Negative.   Eyes: Negative.   Respiratory: Negative.   Cardiovascular: Negative.   Gastrointestinal: Negative.   Musculoskeletal: Negative.   Skin: Negative.   Neurological: Positive for tremors.  Psychiatric/Behavioral: Positive for depression, suicidal ideas and substance abuse. Negative for hallucinations. The patient is nervous/anxious and has insomnia.     Blood pressure 162/78, pulse 107, temperature 98.7 F (37.1 C), temperature source Oral, resp. rate 18, height '5\' 10"'  (1.778 m), weight 73.936 kg (163 lb), SpO2 95 %.Body mass index is 23.39 kg/(m^2).  General Appearance: Disheveled  Eye Contact::  Minimal  Speech:  Slow  Volume:  Decreased  Mood:  Depressed  Affect:  Depressed  Thought Process:  Linear   Orientation:  Full (Time, Place, and Person)  Thought Content:  Negative  Suicidal Thoughts:  Yes.  with intent/plan  Homicidal Thoughts:  No  Memory:  Immediate;   Good Recent;   Fair Remote;   Fair  Judgement:  Fair  Insight:  Fair  Psychomotor Activity:  Decreased  Concentration:  Fair  Recall:  [employer] of Knowledge:Good  Language: Good  Akathisia:  No  Handed:  Right  AIMS (if indicated):     Assets:  Communication Skills Desire for Improvement Physical Health  ADL's:  Intact  Cognition: WNL  Sleep:      Medical Decision Making: Review of Psycho-Social Stressors (1), Review or order clinical lab tests (1), Review and summation of old records (2), Established Problem, Worsening (2), Review of Last Therapy Session (1), Review or order medicine tests (1) and Review of Medication Regimen & Side Effects (2)  Treatment  Plan Summary: Medication management and Plan Patient requires medical detox but also requires hospital level treatment because of active suicidal ideation with threats to walk in traffic or cutting himself. Patient will be admitted to the psychiatry ward. He will receive appropriate alcohol detox orders. Patient will also be started on citalopram at this time for depressive symptoms. Psychoeducation and supportive counseling completed. Case discussed with emergency room psychiatry staff.  Plan:  Recommend psychiatric Inpatient admission when medically cleared. Discussed crisis plan, support from social network, calling 911, coming to the Emergency Department, and calling Suicide Hotline. Disposition: Patient will be admitted to psychiatry ward for detox and treatment of depression  Katori Wirsing 10/23/2014 4:20 PM

## 2014-10-23 NOTE — BH Assessment (Signed)
Assessment Note  Matthew [name] is an 54 y.o. male who presents to ER due to having SI. According to the pt. His life has changed for the worse due to his alcohol use. He has obtained several charges; DWI, DWLR, Civil Revocation Drivers license's, Pension scheme manager, Failure to reduce speed and Hit/Run fil to stop property damage.  He lost his job and housing.   Pt. Was living at [facility] for approximately 7 months. He was able to get a job, working towards getting his own place. He was building a relationship with his family and purchased a car. However, due to his relapse, everything he gained he lost. His is currently living with different people, on their couches, family "barely talk" to him.  He lost his job and his car. Pt. Is experiencing guilt and shame and his symptoms of depression are increasing.  As far as his SI, he reported to ER staff, he wanted to drink Clorox, walk in front of ongoing traffic or overdose on medications.  He told this Clinical research associate, he was thinking about standing in a chair and following back and hit his head and busted his head open.  Pt. Denies HI and AV/H.   Axis I: Substance Abuse and Substance Induced Mood Disorder Axis III:  Past Medical History  Diagnosis Date  . Hypertension   . Diabetes mellitus without complication    Axis IV: economic problems, occupational problems, problems related to legal system/crime, problems related to social environment, problems with access to health care services and problems with primary support group  Past Medical History:  Past Medical History  Diagnosis Date  . Hypertension   . Diabetes mellitus without complication     History reviewed. No pertinent past surgical history.  Family History: No family history on file.  Social History:  reports that he has been smoking.  He has never used smokeless tobacco. He reports that he drinks about 14.4 oz of alcohol per week. He reports that he does not use  illicit drugs.  Additional Social History:  Alcohol / Drug Use Pain Medications: None Reported Prescriptions: None Reported Over the Counter: None Reported History of alcohol / drug use?: Yes Longest period of sobriety (when/how long): 7 months Negative Consequences of Use: Legal, Financial, Personal relationships, Work / School Withdrawal Symptoms: Sweats, Nausea / Vomiting, Tingling, Fever / Chills Substance #1 Name of Substance 1: Alcohol 1 - Age of First Use: 18 1 - Amount (size/oz): 1 pint to a fifth and a couple of beers (2-40oz) 1 - Frequency: Daily 1 - Duration: Consistently for approximately 3 months 1 - Last Use / Amount: 10/22/2014  CIWA: CIWA-Ar BP: (!) 162/78 mmHg Pulse Rate: (!) 107 COWS:    Allergies: No Known Allergies  Home Medications:  (Not in a hospital admission)  OB/GYN Status:  No LMP for male patient.  General Assessment Data Location of Assessment: [facility] ED [facility] Assessment: In system Is this a Tele or Face-to-Face Assessment?: Face-to-Face Is this an Initial Assessment or a Re-assessment for this encounter?: Initial Assessment Marital status: Single Maiden name: n/a Is patient pregnant?: No Pregnancy Status: No Living Arrangements: Other (Comment), Non-relatives/Friends ("[place] to [place] Can pt return to current living arrangement?: Yes Admission Status: Voluntary Is patient capable of signing voluntary admission?: Yes Referral Source: Self/Family/Friend Insurance type: None  Medical Screening Exam [facility] Walk-in ONLY) Medical Exam completed: Yes  Crisis Care Plan Living Arrangements: Other (Comment), Non-relatives/Friends ("[place] to [place] Name of Psychiatrist: n/a  Name of Therapist: n/a  Education Status Is patient currently in school?: No Current Grade: n/a Highest grade of school patient has completed: 11th Name of school: n/a Contact person: na  Risk to self with the past 6 months Suicidal Ideation: Yes-Currently  Present Has patient been a risk to self within the past 6 months prior to admission? : Yes Suicidal Intent: Yes-Currently Present Is patient at risk for suicide?: Yes Suicidal Plan?: Yes-Currently Present Has patient had any suicidal plan within the past 6 months prior to admission? : Yes Specify Current Suicidal Plan: Drink Clorox and walk in front of traffic Access to Means: No What has been your use of drugs/alcohol within the last 12 months?: Alcohol Previous Attempts/Gestures: No How many times?: 0 Other Self Harm Risks: None Reported Triggers for Past Attempts: Other (Comment) (Active Addiction) Intentional Self Injurious Behavior: None Family Suicide History: Unknown Recent stressful life event(s): Other (Comment), Loss (Comment), Job Loss, Financial Problems, Legal Issues, Conflict (Comment) (Active Addiction ) Persecutory voices/beliefs?: No Depression: Yes Depression Symptoms: Feeling angry/irritable, Feeling worthless/self pity, Loss of interest in usual pleasures, Tearfulness, Isolating, Guilt Substance abuse history and/or treatment for substance abuse?: Yes (Alcohol) Suicide prevention information given to non-admitted patients: Not applicable  Risk to Others within the past 6 months Homicidal Ideation: No Does patient have any lifetime risk of violence toward others beyond the six months prior to admission? : No Thoughts of Harm to Others: No Current Homicidal Intent: No Current Homicidal Plan: No Access to Homicidal Means: No Identified Victim: None Reported History of harm to others?: No Assessment of Violence: None Noted Violent Behavior Description: None Reported Does patient have access to weapons?: No Criminal Charges Pending?: Yes Describe Pending Criminal Charges: DWI, Pension scheme manager, Hit & Run, Failure to Reduce speed. Does patient have a court date: Yes Court Date: 10/23/14 Is patient on probation?: No  Psychosis Hallucinations: None  noted Delusions: None noted  Mental Status Report Appearance/Hygiene: In scrubs, In hospital gown, Unremarkable Eye Contact: Poor Motor Activity: Tremors, Unremarkable, Freedom of movement Speech: Logical/coherent Level of Consciousness: Alert Mood: Depressed, Anxious, Fearful, Guilty, Helpless, Sad Affect: Anxious, Depressed, Appropriate to circumstance Anxiety Level: Minimal Thought Processes: Coherent, Relevant Judgement: Impaired Orientation: Person, Place, Time, Situation, Appropriate for developmental age Obsessive Compulsive Thoughts/Behaviors: Minimal  Cognitive Functioning Concentration: Normal Memory: Recent Intact, Remote Impaired IQ: Average Insight: Poor Impulse Control: Poor Appetite: Fair Weight Loss: 0 Weight Gain: 0 Sleep: No Change Total Hours of Sleep: 6 Vegetative Symptoms: None  ADLScreening [facility] Assessment Services) Patient's cognitive ability adequate to safely complete daily activities?: Yes Patient able to express need for assistance with ADLs?: Yes Independently performs ADLs?: Yes (appropriate for developmental age)  Prior Inpatient Therapy Prior Inpatient Therapy: Yes Prior Therapy Dates: 03/2014-09/2014 Prior Therapy Facilty/Provider(s): [facility] Reason for Treatment: Residential Substance Abuse Treatment  Prior Outpatient Therapy Prior Outpatient Therapy: No Prior Therapy Dates: n/a Prior Therapy Facilty/Provider(s): n/a Reason for Treatment: n/a Does patient have an ACCT team?: No Does patient have Intensive In-House Services?  : No Does patient have [facility] services? : No Does patient have P4CC services?: No  ADL Screening (condition at time of admission) Patient's cognitive ability adequate to safely complete daily activities?: Yes Patient able to express need for assistance with ADLs?: Yes Independently performs ADLs?: Yes (appropriate for developmental age)       Abuse/Neglect Assessment (Assessment to be complete while patient  is alone) Physical Abuse: Denies Verbal Abuse: Denies Sexual Abuse: Denies Exploitation of patient/patient's resources:  Denies Self-Neglect: Denies Values / Beliefs Cultural Requests During Hospitalization: None Spiritual Requests During Hospitalization: None Consults Spiritual Care Consult Needed: No Social Work Consult Needed: No Merchant navy officer (For Healthcare) Does patient have an advance directive?: No    Additional Information 1:1 In Past 12 Months?: No CIRT Risk: No Elopement Risk: No Does patient have medical clearance?: Yes  Child/Adolescent Assessment Running Away Risk: Denies (Pt. is an adult)  Disposition:  Disposition Initial Assessment Completed for this Encounter: Yes Disposition of Patient: Inpatient treatment program Type of inpatient treatment program: Adult [facility])  On Site Evaluation by:   Reviewed with Physician:    Lilyan Gilford, MS, LCAS, LPC, NCC, CCSI 10/23/2014 5:19 PM

## 2014-10-24 DIAGNOSIS — I10 Essential (primary) hypertension: Secondary | ICD-10-CM

## 2014-10-24 DIAGNOSIS — F322 Major depressive disorder, single episode, severe without psychotic features: Secondary | ICD-10-CM

## 2014-10-24 DIAGNOSIS — F17200 Nicotine dependence, unspecified, uncomplicated: Secondary | ICD-10-CM

## 2014-10-24 DIAGNOSIS — F1020 Alcohol dependence, uncomplicated: Secondary | ICD-10-CM

## 2014-10-24 LAB — HEPATIC FUNCTION PANEL
ALK PHOS: 118 U/L (ref 38–126)
ALT: 66 U/L — ABNORMAL HIGH (ref 17–63)
AST: 115 U/L — ABNORMAL HIGH (ref 15–41)
Albumin: 4.2 g/dL (ref 3.5–5.0)
BILIRUBIN DIRECT: 0.5 mg/dL (ref 0.1–0.5)
BILIRUBIN INDIRECT: 2.5 mg/dL — AB (ref 0.3–0.9)
BILIRUBIN TOTAL: 3 mg/dL — AB (ref 0.3–1.2)
TOTAL PROTEIN: 7.4 g/dL (ref 6.5–8.1)

## 2014-10-24 MED ORDER — LORAZEPAM 2 MG PO TABS
2.0000 mg | ORAL_TABLET | Freq: Three times a day (TID) | ORAL | Status: DC | PRN
  Administered 2014-10-24: 2 mg via ORAL
  Filled 2014-10-24: qty 1

## 2014-10-24 MED ORDER — CHLORDIAZEPOXIDE HCL 25 MG PO CAPS
25.0000 mg | ORAL_CAPSULE | Freq: Four times a day (QID) | ORAL | Status: DC
  Administered 2014-10-24 – 2014-10-25 (×5): 25 mg via ORAL
  Filled 2014-10-24 (×5): qty 1

## 2014-10-24 NOTE — [facility] Group Notes (Signed)
[facility] Group Notes:  (Nursing/MHT/Case Management/Adjunct)  Date:  10/24/2014  Time:  12:41 PM  Type of Therapy:  Psychoeducational Skills  Participation Level:  Active  Participation Quality:  Attentive  Affect:  Appropriate  Cognitive:  Appropriate  Insight:  Good  Engagement in Group:  Supportive  Modes of Intervention:  Support  Summary of Progress/Problems:  Matthew [name] 10/24/2014, 12:41 PM

## 2014-10-24 NOTE — [facility] Suicide Risk Assessment (Signed)
[facility] Admission Suicide Risk Assessment   Nursing information obtained from:  Patient Demographic factors:  Male, Divorced or widowed, Unemployed Current Mental Status:  Suicidal ideation indicated by patient, Suicide plan Loss Factors:  Decrease in vocational status, Loss of significant relationship, Legal issues, Financial problems / change in socioeconomic status Historical Factors:  NA Risk Reduction Factors:  NA Total Time spent with patient: 1 hour Principal Problem: <principal problem not specified> Diagnosis:   Patient Active Problem List   Diagnosis Date Noted  . Alcohol use disorder, severe, dependence [F10.20] 10/24/2014  . Tobacco use disorder [Z72.0] 10/24/2014  . 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 Alcohol withdrawal [F10.239] 10/23/2014     Continued Clinical Symptoms:  Alcohol Use Disorder Identification Test Final Score (AUDIT): 31 The "Alcohol Use Disorders Identification Test", Guidelines for Use in Primary Care, Second Edition.  World Science writer [facility]). Score between 0-7:  no or low risk or alcohol related problems. Score between 8-15:  moderate risk of alcohol related problems. Score between 16-19:  high risk of alcohol related problems. Score 20 or above:  warrants further diagnostic evaluation for alcohol dependence and treatment.   CLINICAL FACTORS:   Depression:   Comorbid alcohol abuse/dependence Alcohol/Substance Abuse/Dependencies   Musculoskeletal: Strength & Muscle Tone: within normal limits Gait & Station: normal Patient leans: N/A  Psychiatric Specialty Exam: Physical Exam  ROS    COGNITIVE FEATURES THAT CONTRIBUTE TO RISK:  None    SUICIDE RISK:   Mild:  Suicidal ideation of limited frequency, intensity, duration, and specificity.  There are no identifiable plans, no associated intent, mild dysphoria and related symptoms, good self-control (both objective and subjective assessment), few other risk  factors, and identifiable protective factors, including available and accessible social support.  PLAN OF CARE: Admit to behavioral health  Medical Decision Making:  Established Problem, Worsening (2)  I certify that inpatient services furnished can reasonably be expected to improve the patient's condition.   Matthew [name] 10/24/2014, 12:33 PM

## 2014-10-24 NOTE — Progress Notes (Signed)
D: Patient denies SI/HI/AVH. Patient affect is sad. Mood is depressed.  Patient did attend evening group. Patient visible on the milieu. No distress noted. A: Support and encouragement offered. Scheduled medications given to pt. Q 15 min checks continued for patient safety. R: Patient receptive. Patient remains safe on the unit.   

## 2014-10-24 NOTE — [facility] Group Notes (Signed)
[facility] LCSW Group Therapy  10/24/2014 2:44 PM  Type of Therapy:  Group Therapy  Participation Level:  Active  Participation Quality:  Attentive  Affect:  Depressed  Cognitive:  Alert and Oriented  Insight:  Engaged  Engagement in Therapy:  Engaged  Modes of Intervention:  Socialization and Support  Summary of Progress/Problems: Patient attended and participated appropriately but was depressed and shared his feelings around depression during group discussion.   Beryl Meager T 10/24/2014, 2:44 PM

## 2014-10-24 NOTE — Progress Notes (Signed)
He stayed in bed most of the time.minimal interaction with staff & peers.Denies suicidal & homicidal ideation or hallucination.Compliant with medicine..Attended groups.No signs of withdrawal noted.Encouraged hydration.

## 2014-10-24 NOTE — Plan of Care (Signed)
Problem: Alteration in mood & ability to function due to Goal: LTG-Pt reports reduction in suicidal thoughts (Patient reports reduction in suicidal thoughts and is able to verbalize a safety plan for whenever patient is feeling suicidal)  Outcome: Progressing Patient denies suicidal ideation

## 2014-10-24 NOTE — Progress Notes (Signed)
Recreation Therapy Notes  INPATIENT RECREATION THERAPY ASSESSMENT  Patient Details Name: KIELER INSCOE MRN: [id number] DOB: April 08, 1961 Today's Date: 10/24/2014  Patient Stressors: Family, Friends, Other (Comment) (Lack of friends, everything)  Coping Skills:   Isolate, Substance Abuse, Sports, Other (Comment) Teacher, English as a foreign language)  Personal Challenges: Anger, Communication, Concentration, Decision-Making, Expressing Yourself, Problem-Solving, Relationships, Self-Esteem/Confidence, Social Interaction, Stress Management, Time Management, Trusting Others  Leisure Interests (2+):  Individual - TV, Individual - Other (Comment) (Lay down)  Awareness of Community Resources:  Yes  Community Resources:  [place], [place]  Current Use: No  If no, Barriers?: Other (Comment) ("I just don't go")  Patient Strengths:  No  Patient Identified Areas of Improvement:  Not really  Current Recreation Participation:  Nothing  Patient Goal for Hospitalization:  "I don't know"  [place] of Residence:  [place] of Residence:  [place]   Current SI (including self-harm):  Yes  Current HI:  No  Consent to Intern Participation: N/A   Jacquelynn Cree, LRT/CTRS 10/24/2014, 2:13 PM

## 2014-10-24 NOTE — Plan of Care (Signed)
Problem: [facility] Participation in Recreation Therapeutic Interventions Goal: STG-Patient will demonstrate improved self esteem by identif STG: Self-Esteem - Within 4 treatment sessions, patient will verbalize at least 5 positive affirmation statements in each of 2 treatment sessions to increase self-esteem post d/c.  Outcome: Progressing Treatment Session 1; Completed 1 out of 2: At approximately 2:30 pm, LRT met with patient in patient room. Patient verbalized 5 positive affirmation statements. Patient reported it "didn't feel like anything". LRT encouraged patient to continue saying positive affirmation statements.  Leonette Monarch, LRT/CTRS 06.10.16 2:46 pm Goal: STG-Other Recreation Therapy Goal (Specify) STG: Stress Management - Within 5 treatment sessions, patient will demonstrate at least one stress management technique in one treatment session to increase stress management skills post d/c.  Outcome: Progressing Treatment Session 1; Completed 0 out of 1: At approximately 2:30 pm, LRT met with patient in patient room. LRT educated and provided patient with handouts on stress management techniques. Patient verbalized understanding. LRT encouraged patient to read over and practice the stress management techniques.  Leonette Monarch, LRT/CTRS 06.10.16 2:48 pm

## 2014-10-24 NOTE — H&P (Addendum)
Psychiatric Admission Assessment Adult  Patient Identification: Matthew [name] MRN:  [id number] Date of Evaluation:  10/24/2014 Chief Complaint:  major depression Principal Diagnosis: Major depressive disorder, single episode, severe without psychotic features Diagnosis:   Patient Active Problem List   Diagnosis Date Noted  . Alcohol use disorder, severe, dependence [F10.20] 10/24/2014  . Tobacco use disorder [Z72.0] 10/24/2014  . HTN (hypertension), benign [I10] 10/24/2014  . Major depressive disorder, single episode, severe without psychotic features [F32.2] 10/24/2014  . Alcohol withdrawal [F10.239] 10/23/2014   History of Present Illness:  Matthew [name] is a 54 y.o. male who presented to the emergency department on 6/9 under IVC for concerns for depression and suicidal ideation. "The patient states that he feels like hurting everybody he looks. He states that he wants to jump off a bridge in a traffic. He has had decreased appetite and insomnia. He states he has been drinking alcohol. "Today the patient was interviewed. The patient has a history of alcoholism and has been abusing alcohol for 30 years. He was admitted to our [facility] ([facility] term [facility] substance abuse residential program) and was able to sustain sobriety for 6 months. This was the first time in his life that he ha he had been sober for a significant period of time.  However in the first week of June he relapsed on alcohol and was charged with a DWI, therefore he was asked to leave the facility. The patient was incarcerated for a week and lost his job at [employer] (job that he had found through [facility]).  Since then the patient has been staying with a friend. He states he has been drinking heavily about a fifth of liquor and 240 ounces every day. He is started having severe depression and suicidal ideation after he found out that he could not pay his bills because he had lost his job.  The friend that bailed him out from jail became upset with  him because he did not have the money to pay him back .  His friend placed ace all the patient's belongings into storage.  Patient states he's been having suicidal thoughts everyday since all of these happen. The patient does have believe that if he were to commit suicide he will go to hell and did not have a plan on how to harm himself without causing pain. Today patient continues to voice suicidal ideation.  Patient denies HI, auditory or visual hallucinations. Patient denies any symptoms consistent with bipolar disorder mania or hypomania.  Substance abuse history: Patient states he has been abusing alcohol for at least 30 years. He was admitted at [facility] (long-term substance abuse inpatientpatient program in [place]).  Patient was able to remain sober for 6 months. After that he relapsed and was asked to leave.  Patient reports history of 3 DWIs in his life. The last one to place during this last relapse. Patient drinks about one pack of cigarettes per day.  Denies history of abusing any other substances or prescription medications.  Elements:  Severity:  Severe. Timing:  Chronic with acute exacerbation. Duration:  Over the last week. Context:  Relapse. Associated Signs/Symptoms: Depression Symptoms:  depressed mood, psychomotor agitation, recurrent thoughts of death,  Total Time spent with patient: 1 hour   Past psychiatric history: Patient denies any psychiatric treatment in the past for anything other than substance abuse. Never been on psychiatric medicine. No history of suicide attempts or psychiatric hospitalization. On history of alcohol abuse. Has been able  to maintain sobriety for up to 6 weeks in the past.  Past Medical History: Patient has never been treated for any chronic medical condition.  Past Medical History  Diagnosis Date  . Hypertension    History reviewed. No pertinent past surgical history.  Family History: Reports having some uncles suffer from  alcoholism. There is no family history of mental illness or suicides in his family Family History  Problem Relation Age of Onset  . Family history unknown: Yes   Social History: He had recently been staying at our [facility] and estimates that he had been there for 6 months. He relapsed and so he is not able to go back there at this time. Patient evidently still has legal charges and problems in front of him although he's out of jail. He does have extended family as well as children of his own but has not been in touch with him because of his embarrassment about his situation.  Patient is currently unemployed and homeless. History  Alcohol Use  . Yes    Comment: 80 oz beer daily, 1/2 pint liquor daily     History  Drug Use No    History   Social History  . Marital Status: Divorced    Spouse Name: N/A  . Number of Children: N/A  . Years of Education: N/A   Social History Main Topics  . Smoking status: Current Every Day Smoker -- 0.50 packs/day for 35 years  . Smokeless tobacco: Never Used  . Alcohol Use: Yes     Comment: 80 oz beer daily, 1/2 pint liquor daily  . Drug Use: No  . Sexual Activity: Not Currently    Birth Control/ Protection: None   Other Topics Concern  . None   Social History Narrative   Additional Social History:    Pain Medications: none Prescriptions: none Over the Counter: none History of alcohol / drug use?: Yes Longest period of sobriety (when/how long): 6 months Negative Consequences of Use: Financial, Armed forces operational officer, Personal relationships, Work / School Withdrawal Symptoms: Tremors, Change in blood pressure, Nausea / Vomiting Name of Substance 1: alcohol 1 - Age of First Use: 18 1 - Amount (size/oz): 2-40 oz daily, 1/2 pint liquor daily 1 - Frequency: daily 1 - Duration: started drinking age 75 1 - Last Use / Amount: 6/8/21016   Musculoskeletal: Strength & Muscle Tone: within normal limits Gait & Station: normal Patient leans: N/A  Psychiatric Specialty  Exam: Physical Exam  Review of Systems  Constitutional: Negative.   HENT: Negative.   Eyes: Negative.   Respiratory: Negative.   Cardiovascular: Negative.   Gastrointestinal: Positive for diarrhea.  Genitourinary: Negative.   Musculoskeletal: Negative.   Skin: Negative.   Neurological: Negative.   Endo/Heme/Allergies: Negative.   Psychiatric/Behavioral: Positive for depression, suicidal ideas and substance abuse.    Blood pressure 159/85, pulse 96, temperature 99.3 F (37.4 C), temperature source Oral, resp. rate 20, height  (1.778 m), weight 73.936 kg (163 lb), SpO2 97 %.Body mass index is 23.39 kg/(m^2).  General Appearance: Disheveled  Eye Contact::  Minimal  Speech:  Normal Rate  Volume:  Normal  Mood:  Depressed  Affect:  Blunt  Thought Process:  Linear  Orientation:  Full (Time, Place, and Person)  Thought Content:  Hallucinations: None  Suicidal Thoughts:  No  Homicidal Thoughts:  No  Memory:  Immediate;   Good Recent;   Good Remote;   Good  Judgement:  Fair  Insight:  Fair  Psychomotor Activity:  Decreased  Concentration:  Good  Recall:  NA  Fund of Knowledge:Fair  Language: Good  Akathisia:  No  Handed:    AIMS (if indicated):     Assets:  Manufacturing systems engineer Physical Health Vocational/Educational  ADL's:  Intact  Cognition: WNL  Sleep:  Number of Hours: 7   Physical examination completed in our emergency department  Constitutional: Alert and oriented. Tearful Eyes: Conjunctivae are normal. PERRL. Normal extraocular movements. ENT   Head: Normocephalic and atraumatic.   Nose: No congestion/rhinnorhea.   Mouth/Throat: Mucous membranes are moist.   Neck: No stridor. Hematological/Lymphatic/Immunilogical: No cervical lymphadenopathy. Cardiovascular: Normal rate, regular rhythm. No murmurs, rubs, or gallops. Respiratory: Normal respiratory effort without tachypnea nor retractions. Breath sounds are clear and equal bilaterally. No  wheezes/rales/rhonchi. Gastrointestinal: Soft and nontender. No distention.  Genitourinary: Deferred Musculoskeletal: Normal range of motion in all extremities. No joint effusions. No lower extremity tenderness nor edema. Neurologic: Normal speech and language. No gross focal neurologic deficits are appreciated. Speech is normal.  Skin: Skin is warm, dry and intact. No rash noted. Psychiatric: Tearful, endorses SI   Alcohol Screening: 1. How often do you have a drink containing alcohol?: 4 or more times a week 2. How many drinks containing alcohol do you have on a typical day when you are drinking?: 10 or more 3. How often do you have six or more drinks on one occasion?: Daily or almost daily Preliminary Score: 8 4. How often during the last year have you found that you were not able to stop drinking once you had started?: Daily or almost daily 5. How often during the last year have you failed to do what was normally expected from you becasue of drinking?: Daily or almost daily 6. How often during the last year have you needed a first drink in the morning to get yourself going after a heavy drinking session?: Daily or almost daily 7. How often during the last year have you had a feeling of guilt of remorse after drinking?: Daily or almost daily 8. How often during the last year have you been unable to remember what happened the night before because you had been drinking?: Weekly 9. Have you or someone else been injured as a result of your drinking?: No 10. Has a relative or friend or a doctor or another health worker been concerned about your drinking or suggested you cut down?: No Alcohol Use Disorder Identification Test Final Score (AUDIT): 31 Brief Intervention: Yes  Allergies:  No Known Allergies   Lab Results:  Results for orders placed or performed during the hospital encounter of 10/23/14 (from the past 48 hour(s))  Hepatic function panel     Status: Abnormal   Collection  Time: 10/24/14  6:43 AM  Result Value Ref Range   Total Protein 7.4 6.5 - 8.1 g/dL   Albumin 4.2 3.5 - 5.0 g/dL   AST 962 (H) 15 - 41 U/L   ALT 66 (H) 17 - 63 U/L   Alkaline Phosphatase 118 38 - 126 U/L   Total Bilirubin 3.0 (H) 0.3 - 1.2 mg/dL   Bilirubin, Direct 0.5 0.1 - 0.5 mg/dL   Indirect Bilirubin 2.5 (H) 0.3 - 0.9 mg/dL   Current Medications: Current Facility-Administered Medications  Medication Dose Route Frequency Provider Last Rate Last Dose  . acetaminophen (TYLENOL) tablet 650 mg  650 mg Oral Q6H PRN Audery Amel, MD      . alum & mag hydroxide-simeth (MAALOX/MYLANTA) 200-200-20 MG/5ML suspension 30 mL  30 mL Oral Q4H PRN Audery Amel, MD      . chlordiazePOXIDE (LIBRIUM) capsule 25 mg  25 mg Oral Q6H Jimmy Footman, MD   25 mg at 10/24/14 1440  . citalopram (CELEXA) tablet 20 mg  20 mg Oral Daily Audery Amel, MD   20 mg at 10/24/14 1014  . LORazepam (ATIVAN) tablet 2 mg  2 mg Oral Q8H PRN Jimmy Footman, MD      . magnesium hydroxide (MILK OF MAGNESIA) suspension 30 mL  30 mL Oral Daily PRN Audery Amel, MD      . nicotine (NICOTROL) 10 MG inhaler 1 continuous puffing  1 continuous puffing Inhalation PRN Brandy Hale, MD   1 continuous puffing at 10/23/14 2207   PTA Medications: No prescriptions prior to admission    Previous Psychotropic Medications: No   Substance Abuse History in the last 12 months:  Yes.      Consequences of Substance Abuse: Medical Consequences:  Elevated liver enzymes Legal Consequences:  History of legal problems Withdrawal Symptoms:   Tremors  Results for orders placed or performed during the hospital encounter of 10/23/14 (from the past 72 hour(s))  Hepatic function panel     Status: Abnormal   Collection Time: 10/24/14  6:43 AM  Result Value Ref Range   Total Protein 7.4 6.5 - 8.1 g/dL   Albumin 4.2 3.5 - 5.0 g/dL   AST 093 (H) 15 - 41 U/L   ALT 66 (H) 17 - 63 U/L   Alkaline Phosphatase 118 38 - 126  U/L   Total Bilirubin 3.0 (H) 0.3 - 1.2 mg/dL   Bilirubin, Direct 0.5 0.1 - 0.5 mg/dL   Indirect Bilirubin 2.5 (H) 0.3 - 0.9 mg/dL    Psychological Evaluations: No   Treatment Plan Summary: Daily contact with patient to assess and evaluate symptoms and progress in treatment and Medication management  Depressive symptoms: Patient has been is started on citalopram 20 mg by mouth daily. I will decrease the dose to 10 mg by mouth daily as he is reporting diarrhea. I will start the patient on Imodium when necessary.  Alcohol withdrawal: Continue vital signs every 8 hours, continue CIWA every 8 hours.  Patient will be offered Ativan 2 mg every 8 hours if CIWA is greater than 15.  For now he will be started on Librium 25 every 6 hours.  Insomnia: patient will be started on trazodone 50 mg by mouth daily at bedtime when necessary  Hypertension: Unclear if currently elevated blood pressure is due to withdrawals or chronic hypertension. We'll change diet to low sodium and continue to observe.  Tobacco use disorder patient will recall for Nicotrol inhaler when necessary.  Discharge planning once a stable this patient will be discharged to follow-up with substance abuse program. Social worker has been made aware the patient is homeless and might be in need of assistance with housing.   Medical Decision Making:  Established Problem, Worsening (2)  I certify that inpatient services furnished can reasonably be expected to improve the patient's condition.   Jimmy Footman 6/10/20162:51 PM

## 2014-10-24 NOTE — [facility] Group Notes (Signed)
[facility] LCSW Aftercare Discharge Planning Group Note  10/24/2014 10:07 AM  Participation Quality:  did not attend group  Affect:  n/a  Cognitive:  n/a  Insight:  n/a  Engagement in Group:  n/a  Modes of Intervention:  n/a  Summary of Progress/Problems:  Beryl Meager T 10/24/2014, 10:07 AM

## 2014-10-24 NOTE — Progress Notes (Signed)
Recreation Therapy Notes  Date: 06.10.16 Time: 3:00 pm Location: Craft Room  Group Topic: Communication, Problem solving, teamwork  Goal Area(s) Addresses:  Patient will effectively work with peer towards shared goal. Patient will identify skills used to make activity successful. Patient will identify benefit of using group skills effectively post d/c.  Behavioral Response: Attentive, Interactive  Intervention: [employer]  Activity: Patients were divided into groups and instructed to build the tallest free standing tower out of 15 pipe cleaners. After approximately 4 minutes of building, patients were told they had to put their dominant hand behind their back. After approximately 3 minutes, patients were told they could not talk to each other.  Education: LRT educated patients on how communication, problem solving, and teamwork goes into building a healthy support system.  Education Outcome: Acknowledges education/In group clarification offered   Clinical Observations/Feedback: Patient worked with peers to build tower. Patient used effective communication, teamwork, and problem solving skills. Patient contributed to group discussion by stating how to use these skills to build his healthy support system.  Jacquelynn Cree, LRT/CTRS 10/24/2014 4:19 PM

## 2014-10-24 NOTE — Plan of Care (Signed)
Problem: Alteration in mood & ability to function due to Goal: STG: Patient verbalizes decreases in signs of withdrawal Outcome: Progressing No withdrawal signs noted,encouraged hydration.

## 2014-10-25 LAB — TSH: TSH: 1.603 u[IU]/mL (ref 0.350–4.500)

## 2014-10-25 MED ORDER — TRAZODONE HCL 50 MG PO TABS: 50.0000 mg | ORAL_TABLET | Freq: Every evening | ORAL | Status: DC | PRN

## 2014-10-25 MED ORDER — TRAZODONE HCL 50 MG PO TABS
50.0000 mg | ORAL_TABLET | Freq: Every day | ORAL | Status: DC
  Administered 2014-10-25: 50 mg via ORAL
  Filled 2014-10-25: qty 1

## 2014-10-25 MED ORDER — CITALOPRAM HYDROBROMIDE 20 MG PO TABS
10.0000 mg | ORAL_TABLET | Freq: Every day | ORAL | Status: DC
  Administered 2014-10-26 – 2014-10-27 (×2): 10 mg via ORAL
  Filled 2014-10-25: qty 1
  Filled 2014-10-25: qty 2

## 2014-10-25 MED ORDER — AMLODIPINE BESYLATE 5 MG PO TABS
2.5000 mg | ORAL_TABLET | Freq: Every day | ORAL | Status: DC
  Administered 2014-10-25 – 2014-11-03 (×10): 2.5 mg via ORAL
  Filled 2014-10-25 (×4): qty 1
  Filled 2014-10-25: qty 3
  Filled 2014-10-25 (×6): qty 1

## 2014-10-25 NOTE — Progress Notes (Signed)
[facility] MD Progress Note  10/25/2014 12:33 PM Matthew [name]  MRN:  [id number] Subjective:  Patient reports no improvement since admission. He continues to report hopelessness and helplessness, depressed mood and suicidal ideation. He states his suicidal ideation has been present for more than a month. He reports problems with his sleep last night. Still reporting decreased appetite, energy and concentration. Denies auditory or visual hallucinations. Denies homicidal ideation. Denies side effects from medications. Denies physical complaints today.  Principal Problem: Major depressive disorder, single episode, severe without psychotic features Diagnosis:   Patient Active Problem List   Diagnosis Date Noted  . Alcohol use disorder, severe, dependence [F10.20] 10/24/2014  . Tobacco use disorder [Z72.0] 10/24/2014  . HTN (hypertension), benign [I10] 10/24/2014  . Major depressive disorder, single episode, severe without psychotic features [F32.2] 10/24/2014  . Alcohol withdrawal [F10.239] 10/23/2014   Total Time spent with patient: 30 minutes   Past Medical History:  Past Medical History  Diagnosis Date  . Hypertension    History reviewed. No pertinent past surgical history. Family History:  Family History  Problem Relation Age of Onset  . Family history unknown: Yes   Social History:  History  Alcohol Use  . Yes    Comment: 80 oz beer daily, 1/2 pint liquor daily     History  Drug Use No    History   Social History  . Marital Status: Divorced    Spouse Name: N/A  . Number of Children: N/A  . Years of Education: N/A   Social History Main Topics  . Smoking status: Current Every Day Smoker -- 0.50 packs/day for 35 years  . Smokeless tobacco: Never Used  . Alcohol Use: Yes     Comment: 80 oz beer daily, 1/2 pint liquor daily  . Drug Use: No  . Sexual Activity: Not Currently    Birth Control/ Protection: None   Other Topics Concern  . None   Social History Narrative    Additional History:    Sleep: Poor  Appetite:  Poor   Assessment:   Musculoskeletal: Strength & Muscle Tone: within normal limits Gait & Station: normal Patient leans: N/A   Psychiatric Specialty Exam: Physical Exam  Review of Systems  Constitutional: Negative.   HENT: Negative.   Eyes: Negative.   Respiratory: Negative.   Cardiovascular: Negative.   Gastrointestinal: Negative.   Genitourinary: Negative.   Musculoskeletal: Negative.   Skin: Negative.   Endo/Heme/Allergies: Negative.   Psychiatric/Behavioral: Positive for depression and suicidal ideas. The patient has insomnia.     Blood pressure 152/92, pulse 90, temperature 98.4 F (36.9 C), temperature source Oral, resp. rate 20, height 5\' 10"  (1.778 m), weight 73.936 kg (163 lb), SpO2 97 %.Body mass index is 23.39 kg/(m^2).  General Appearance: Disheveled  Eye Solicitor::  Fair  Speech:  Normal Rate  Volume:  Normal  Mood:  Dysphoric  Affect:  Blunt  Thought Process:  Linear  Orientation:  Full (Time, Place, and Person)  Thought Content:  Hallucinations: None  Suicidal Thoughts:  Yes.  without intent/plan  Homicidal Thoughts:  No  Memory:  Immediate;   Fair Recent;   Fair Remote;   Fair  Judgement:  Fair  Insight:  Fair  Psychomotor Activity:  Decreased  Concentration:  Fair  Recall:  NA  Fund of Knowledge:Fair  Language: Good  Akathisia:  No  Handed:    AIMS (if indicated):     Assets:  Communication Skills Physical Health Vocational/Educational  ADL's:  Intact  Cognition: WNL  Sleep:  Number of Hours: 7.25     Current Medications: Current Facility-Administered Medications  Medication Dose Route Frequency Provider Last Rate Last Dose  . acetaminophen (TYLENOL) tablet 650 mg  650 mg Oral Q6H PRN Audery Amel, MD      . alum & mag hydroxide-simeth (MAALOX/MYLANTA) 200-200-20 MG/5ML suspension 30 mL  30 mL Oral Q4H PRN Audery Amel, MD      . chlordiazePOXIDE (LIBRIUM) capsule 25 mg  25 mg  Oral Q6H Jimmy Footman, MD   25 mg at 10/25/14 1141  . citalopram (CELEXA) tablet 20 mg  20 mg Oral Daily Audery Amel, MD   20 mg at 10/25/14 0930  . LORazepam (ATIVAN) tablet 2 mg  2 mg Oral Q8H PRN Jimmy Footman, MD   2 mg at 10/24/14 2241  . magnesium hydroxide (MILK OF MAGNESIA) suspension 30 mL  30 mL Oral Daily PRN Audery Amel, MD      . nicotine (NICOTROL) 10 MG inhaler 1 continuous puffing  1 continuous puffing Inhalation PRN Brandy Hale, MD   1 continuous puffing at 10/23/14 2207    Lab Results:  Results for orders placed or performed during the hospital encounter of 10/23/14 (from the past 48 hour(s))  Hepatic function panel     Status: Abnormal   Collection Time: 10/24/14  6:43 AM  Result Value Ref Range   Total Protein 7.4 6.5 - 8.1 g/dL   Albumin 4.2 3.5 - 5.0 g/dL   AST 161 (H) 15 - 41 U/L   ALT 66 (H) 17 - 63 U/L   Alkaline Phosphatase 118 38 - 126 U/L   Total Bilirubin 3.0 (H) 0.3 - 1.2 mg/dL   Bilirubin, Direct 0.5 0.1 - 0.5 mg/dL   Indirect Bilirubin 2.5 (H) 0.3 - 0.9 mg/dL  TSH     Status: None   Collection Time: 10/25/14  6:42 AM  Result Value Ref Range   TSH 1.603 0.350 - 4.500 uIU/mL    Physical Findings: AIMS:  , ,  ,  ,    CIWA:  CIWA-Ar Total: 5 COWS:     Treatment Plan Summary: Daily contact with patient to assess and evaluate symptoms and progress in treatment and Medication management   Depressive symptoms: Patient has been is started on citalopram 20 mg by mouth daily. Just today dose of citalopram was decreased from 20-10 mg by mouth daily as patient was complaining of diarrhea.  Alcohol withdrawal: No evidence of alcohol withdrawal at this time will discontinue CIWA and q 8 vitals.  Librium will be discontinued .  Insomnia:  patient did not sleep well last night I will increase trazodone to 100 mg by mouth daily at bedtime   Hypertension: Blood pressure continues to be elevated. I will restart the patient on low-dose  Norvasc and I will continue low sodium diet .  Tobacco use disorder patient will recall for Nicotrol inhaler when necessary.  Discharge planning once a stable this patient will be discharged to follow-up with substance abuse program. Social worker has been made aware the patient is homeless and might be in need of assistance with housing.   TSH within the normal limits   Medical Decision Making:  Established Problem, Worsening (2)     Jimmy Footman 10/25/2014, 12:33 PM

## 2014-10-25 NOTE — [facility] Counselor (Signed)
Adult Comprehensive Assessment  Patient ID: Matthew [name], male   DOB: 04-17-1961, 54 y.o.   MRN: [id number]  Information Source: Information source: Patient  Current Stressors:  Educational / Learning stressors: Did not graduate high school  Employment / Job issues: Recently lost job due to going to jail.  Family Relationships: Limited family relationships.  Financial / Lack of resources (include bankruptcy): No income  Housing / Lack of housing: Lack of housing  Physical health (include injuries & life threatening diseases): None reported  Social relationships: Limited social relationships Substance abuse: Pt reports drinking 1 pint or 5th of vodka per day. Denies using other substances.  Bereavement / Loss: None reported   Living/Environment/Situation:  Living Arrangements: Non-relatives/Friends Living conditions (as described by patient or guardian): "I don't like it."  How long has patient lived in current situation?: 3 weeks  What is atmosphere in current home: Temporary  Family History:  Marital status: Divorced Divorced, when?: "I don't remember."  What types of issues is patient dealing with in the relationship?: unable to answer Does patient have children?: Yes How many children?: 3 How is patient's relationship with their children?: Adult children. Pt reports good relationships with his children.   Childhood History:  By whom was/is the patient raised?: Grandparents Description of patient's relationship with caregiver when they were a child: "They were nice."  Patient's description of current relationship with people who raised him/her: Grandparents are deceased.  Does patient have siblings?: No Did patient suffer any verbal/emotional/physical/sexual abuse as a child?: No Did patient suffer from severe childhood neglect?: No Has patient ever been sexually abused/assaulted/raped as an adolescent or adult?: No Was the patient ever a victim of a crime or a disaster?:  No Witnessed domestic violence?: No Has patient been effected by domestic violence as an adult?: No  Education:  Highest grade of school patient has completed: 11th grade   Employment/Work Situation:   Employment situation: Unemployed Patient's job has been impacted by current illness: Yes Describe how patient's job has been impacted: Pt was arrested for DUI and was fired for not showing up for work What is the longest time patient has a held a job?: 14 years  Where was the patient employed at that time?: [employer]  Has patient ever been in the [employer]?: No  Financial Resources:   Financial resources: No income Does patient have a Lawyer or guardian?: No  Alcohol/Substance Abuse:   What has been your use of drugs/alcohol within the last 12 months?: Drinks 1 pintor 1/5th of vodka per day. Denies using other drugs.  If attempted suicide, did drugs/alcohol play a role in this?: No Alcohol/Substance Abuse Treatment Hx: Past Tx, Inpatient If yes, describe treatment: Released from [facility] 3 weeks ago.  Has alcohol/substance abuse ever caused legal problems?: Yes (DUI)  Social Support System:   Patient's Community Support System: None Describe Community Support System: None  Type of faith/religion: NA How does patient's faith help to cope with current illness?: NA  Leisure/Recreation:   Leisure and Hobbies: Spending time with grandchildren   Strengths/Needs:   What things does the patient do well?: "I am a hardworker."  In what areas does patient struggle / problems for patient: eating, sleeping, motivation, depression, drinking alcohol  Discharge Plan:   Does patient have access to transportation?: Yes (bus) Will patient be returning to same living situation after discharge?: No Plan for living situation after discharge: Pt plans to return to friends house or homeless shelter.  Currently receiving  community mental health services: No If no, would patient like referral  for services when discharged?: Yes (What [place] Does patient have financial barriers related to discharge medications?: Yes Patient description of barriers related to discharge medications: No income, no insurance   Summary/Recommendations:   Matthew [name] is a 54 year old male who presented involuntarily to [facility] with SI, depression and alcohol abuse. Pt reports being released from [facility] 3 weeks ago. He was there for 6 months. The same day he completed the program, he relapsed on alcohol. He reports drinking 1 pint or 1/5 of vodka daily for the last 3 weeks. Pt denies using other drugs. He reports difficulty sleeping, eating, and feelings of hopelessness. He states it is difficult for him to get out of bed. He is recently unemployed due to being arrested for a DUI. He was staying with a friend in [place], but does not want to return. Pt is interested in an inpatient or outpatient substance abuse program. He is willing to go to a shelter if needed. Recommendations include; crisis stabilization, therapeutic milieu, medication management including a detox taper, and encourage group attendance and participation.   Rondall Allegra MSW, [place]  10/25/2014

## 2014-10-25 NOTE — [facility] Group Notes (Signed)
[facility] LCSW Group Therapy  10/25/2014 2:26 PM  Type of Therapy:  Group Therapy  Participation Level:  Did Not Attend  Modes of Intervention:  Discussion, Problem-solving, Socialization and Support  Summary of Progress/Problems:Patients identify obstacles, self-sabotaging and enabling behaviors. Patients explore aspects of self sabotage and enabling and how to limit these self-destructive behaviors in everyday life.   Rondall Allegra, MSW, LCSWA 10/25/2014, 2:26 PM

## 2014-10-25 NOTE — Progress Notes (Signed)
Pt has been pleasant and cooperative. Pt. Continues to endorse having fleeting thoughts of suicide but denies having a plan. Pt is able to contract for safety. Pt has been seclusive to his room most of the day. No overt s/s of withdrawal noted.

## 2014-10-25 NOTE — [facility] Group Notes (Signed)
[facility] Group Notes:  (Nursing/MHT/Case Management/Adjunct)  Date:  10/25/2014  Time:  12:22 AM  Type of Therapy:  Group Therapy  Participation Level:  Did Not Attend  Participation Quality:  n/a  Affect:  n/a  Cognitive:  n/a  Insight:  None  Engagement in Group:  n/a  Modes of Intervention:  n/a  Summary of Progress/Problems:  Veva Holes 10/25/2014, 12:22 AM

## 2014-10-25 NOTE — Progress Notes (Signed)
Alert and oriented x 4, denies pain or discomfort, on CIWA every 6 hours asymptomatic no withdrawal symptoms noted, medicated as ordered. Affect is flat and sad, did not attend group this evening. 15 minutes checks maintained, will continue to monitor.

## 2014-10-25 NOTE — [facility] Suicide Risk Assessment (Signed)
[facility] INPATIENT:  Family/Significant Other Suicide Prevention Education  Suicide Prevention Education:  Patient Refusal for Family/Significant Other Suicide Prevention Education: The patient Matthew [name] has refused to provide written consent for family/significant other to be provided Family/Significant Other Suicide Prevention Education during admission and/or prior to discharge.  Physician notified.  Rondall Allegra ,MSW, LCSWA  10/25/2014, 4:28 PM

## 2014-10-25 NOTE — [facility] Group Notes (Signed)
[facility] Group Notes:  (Nursing/MHT/Case Management/Adjunct)  Date:  10/25/2014  Time:  10:20 AM  Type of Therapy:  Community Meeting   Participation Level:  Did Not Attend  Summary of Progress/Problems:  Matthew [name] De'Chelle [name] 10/25/2014, 10:20 AM 

## 2014-10-25 NOTE — Plan of Care (Signed)
Problem: Alteration in mood & ability to function due to Goal: LTG-Pt reports reduction in suicidal thoughts (Patient reports reduction in suicidal thoughts and is able to verbalize a safety plan for whenever patient is feeling suicidal)  Outcome: Progressing Patient denies SI/HI.      

## 2014-10-25 NOTE — Plan of Care (Signed)
Problem: Alteration in mood & ability to function due to Goal: STG-Patient will report withdrawal symptoms Outcome: Progressing Patient withdrawal symptoms.

## 2014-10-26 MED ORDER — TRAZODONE HCL 100 MG PO TABS
100.0000 mg | ORAL_TABLET | Freq: Every day | ORAL | Status: DC
  Administered 2014-10-26: 100 mg via ORAL
  Filled 2014-10-26: qty 1

## 2014-10-26 NOTE — Progress Notes (Signed)
Patient rates his depression a "10"; states "I have thoughts of suicide everyday." Patient denies having a plan, and contracts for safety; denies having any anxiety or hallucinations; continues to be seclusive to his room; has been calm and cooperative throughout the shift; no voiced complaints; continue to monitor.

## 2014-10-26 NOTE — Plan of Care (Signed)
Problem: Alteration in mood & ability to function due to Goal: LTG-Pt verbalizes understanding of importance of med regimen (Patient verbalizes understanding of importance of medication regimen and need to continue outpatient care and support groups)  Outcome: Progressing Patient verbalizes understanding of the importance of taking his medications everyday when discharged.

## 2014-10-26 NOTE — Progress Notes (Signed)
[facility] MD Progress Note  10/26/2014 9:53 AM RAMESES OU  MRN:  [id number] Subjective:  Patient reports no improvement since admission. He continues to report hopelessness and helplessness, depressed mood and suicidal ideation. He states his suicidal ideation has been present for more than a month. He reports problems with his sleep last night. Still reporting decreased appetite, energy and concentration. Denies auditory or visual hallucinations. Denies homicidal ideation. Denies side effects from medications. Denies physical complaints today. Patient is states he attended groups yesterday. His plan for today is to stay out of his room and socialize more with his peers.  Principal Problem: Major depressive disorder, single episode, severe without psychotic features Diagnosis:   Patient Active Problem List   Diagnosis Date Noted  . Alcohol use disorder, severe, dependence [F10.20] 10/24/2014  . Tobacco use disorder [Z72.0] 10/24/2014  . HTN (hypertension), benign [I10] 10/24/2014  . Major depressive disorder, single episode, severe without psychotic features [F32.2] 10/24/2014  . Alcohol withdrawal [F10.239] 10/23/2014   Total Time spent with patient: 30 minutes   Past Medical History:  Past Medical History  Diagnosis Date  . Hypertension    History reviewed. No pertinent past surgical history. Family History:  Family History  Problem Relation Age of Onset  . Family history unknown: Yes   Social History:  History  Alcohol Use  . Yes    Comment: 80 oz beer daily, 1/2 pint liquor daily     History  Drug Use No    History   Social History  . Marital Status: Divorced    Spouse Name: N/A  . Number of Children: N/A  . Years of Education: N/A   Social History Main Topics  . Smoking status: Current Every Day Smoker -- 0.50 packs/day for 35 years  . Smokeless tobacco: Never Used  . Alcohol Use: Yes     Comment: 80 oz beer daily, 1/2 pint liquor daily  . Drug Use: No  . Sexual  Activity: Not Currently    Birth Control/ Protection: None   Other Topics Concern  . None   Social History Narrative   Additional History:    Sleep: Poor  Appetite:  Poor   Assessment:   Musculoskeletal: Strength & Muscle Tone: within normal limits Gait & Station: normal Patient leans: N/A   Psychiatric Specialty Exam: Physical Exam   Review of Systems  Constitutional: Negative.   HENT: Negative.   Eyes: Negative.   Respiratory: Negative.   Cardiovascular: Negative.   Gastrointestinal: Negative.   Genitourinary: Negative.   Musculoskeletal: Negative.   Skin: Negative.   Endo/Heme/Allergies: Negative.   Psychiatric/Behavioral: Positive for depression and suicidal ideas. The patient has insomnia.     Blood pressure 120/83, pulse 98, temperature 98.3 F (36.8 C), temperature source Oral, resp. rate 20, height  (1.778 m), weight 73.936 kg (163 lb), SpO2 97 %.Body mass index is 23.39 kg/(m^2).  General Appearance: Disheveled  Eye Solicitor::  Fair  Speech:  Normal Rate  Volume:  Normal  Mood:  Dysphoric  Affect:  Blunt  Thought Process:  Linear  Orientation:  Full (Time, Place, and Person)  Thought Content:  Hallucinations: None  Suicidal Thoughts:  Yes.  without intent/plan  Homicidal Thoughts:  No  Memory:  Immediate;   Fair Recent;   Fair Remote;   Fair  Judgement:  Fair  Insight:  Fair  Psychomotor Activity:  Decreased  Concentration:  Fair  Recall:  NA  Fund of Knowledge:Fair  Language: Good  Akathisia:  No  Handed:    AIMS (if indicated):     Assets:  Manufacturing systems engineer Physical Health Vocational/Educational  ADL's:  Intact  Cognition: WNL  Sleep:  Number of Hours: 8.15     Current Medications: Current Facility-Administered Medications  Medication Dose Route Frequency Provider Last Rate Last Dose  . acetaminophen (TYLENOL) tablet 650 mg  650 mg Oral Q6H PRN Audery Amel, MD      . alum & mag hydroxide-simeth (MAALOX/MYLANTA)  200-200-20 MG/5ML suspension 30 mL  30 mL Oral Q4H PRN Audery Amel, MD      . amLODipine (NORVASC) tablet 2.5 mg  2.5 mg Oral Daily Jimmy Footman, MD   2.5 mg at 10/26/14 0924  . citalopram (CELEXA) tablet 10 mg  10 mg Oral Daily Jimmy Footman, MD   10 mg at 10/26/14 0924  . magnesium hydroxide (MILK OF MAGNESIA) suspension 30 mL  30 mL Oral Daily PRN Audery Amel, MD      . nicotine (NICOTROL) 10 MG inhaler 1 continuous puffing  1 continuous puffing Inhalation PRN Brandy Hale, MD   1 continuous puffing at 10/23/14 2207  . traZODone (DESYREL) tablet 50 mg  50 mg Oral QHS Jimmy Footman, MD   50 mg at 10/25/14 2148  . traZODone (DESYREL) tablet 50 mg  50 mg Oral QHS PRN Jimmy Footman, MD        Lab Results:  Results for orders placed or performed during the hospital encounter of 10/23/14 (from the past 48 hour(s))  TSH     Status: None   Collection Time: 10/25/14  6:42 AM  Result Value Ref Range   TSH 1.603 0.350 - 4.500 uIU/mL    Physical Findings: AIMS:  , ,  ,  ,    CIWA:  CIWA-Ar Total: 5 COWS:     Treatment Plan Summary: Daily contact with patient to assess and evaluate symptoms and progress in treatment and Medication management   Depressive symptoms: Continue citalopram 10 mg by mouth daily  Alcohol withdrawal: No evidence of alcohol withdrawal at this time will discontinue CIWA and q 8 vitals.  Librium will be discontinued .  Insomnia: Continue trazodone 100 mg by mouth daily at bedtime.  Hypertension: Patient has been started on Norvasc 2.5 mg by mouth daily. Continue low sodium diet  Tobacco use disorder patient will recall for Nicotrol inhaler when necessary.  Discharge planning once a stable this patient will be discharged to follow-up with substance abuse program. Social worker has been made aware the patient is homeless and might be in need of assistance with housing.   TSH within the normal limits   Medical  Decision Making:  Established Problem, Worsening (2)     Jimmy Footman 10/26/2014, 9:53 AM

## 2014-10-26 NOTE — [facility] Group Notes (Signed)
[facility] Group Notes:  (Nursing/MHT/Case Management/Adjunct)  Date:  10/26/2014  Time:  1:18 AM  Type of Therapy:  Group Therapy  Participation Level:  None    Summary of Progress/Problems:  Matthew [name] 10/26/2014, 1:18 AM

## 2014-10-26 NOTE — Plan of Care (Signed)
Problem: Alteration in mood & ability to function due to Goal: LTG-Pt reports reduction in suicidal thoughts (Patient reports reduction in suicidal thoughts and is able to verbalize a safety plan for whenever patient is feeling suicidal)  Outcome: Not Progressing Patient continues to have daily thoughts of suicide, but denies having a plan, and contracts for safety.

## 2014-10-26 NOTE — Progress Notes (Signed)
Pt has been pleasant and cooperative. Pt. Continues to endorse having fleeting thoughts of suicide but denies having a plan. Pt is able to contract for safety. Pt has been seclusive to his room most of the day. No overt s/s of withdrawal noted.  

## 2014-10-26 NOTE — Plan of Care (Signed)
Problem: Ineffective individual coping Goal: STG: Patient will remain free from self harm Outcome: Not Progressing Patient continues to have thoughts of suicide, but denies having a plan, and contracts for safety.

## 2014-10-26 NOTE — [facility] Group Notes (Signed)
[facility] LCSW Group Therapy  10/26/2014 3:55 PM  Type of Therapy:  Group Therapy  Participation Level:  Did Not Attend  Participation Quality:    Affect:    Cognitive:    Insight:    Engagement in Therapy:    Modes of Intervention:    Summary of Progress/Problems:  Matthew [name] 10/26/2014, 3:55 PM

## 2014-10-27 MED ORDER — TRAZODONE HCL 100 MG PO TABS
150.0000 mg | ORAL_TABLET | Freq: Every day | ORAL | Status: DC
  Administered 2014-10-27 – 2014-11-02 (×7): 150 mg via ORAL
  Filled 2014-10-27 (×7): qty 1

## 2014-10-27 MED ORDER — CITALOPRAM HYDROBROMIDE 20 MG PO TABS
10.0000 mg | ORAL_TABLET | Freq: Every day | ORAL | Status: DC
  Administered 2014-10-27 – 2014-10-29 (×3): 10 mg via ORAL
  Filled 2014-10-27 (×3): qty 1

## 2014-10-27 NOTE — Progress Notes (Signed)
[name] MD Progress Note  10/27/2014 11:42 AM Matthew [name]  MRN:  [id number] Subjective:  Patient reports no improvement since admission. He states he did not sleep well last night. He woke up at 3 AM in the morning and started thinking about drinking alcohol and going to the edge of a bridge. Patient feels guilty about relapsing. He feels that he let people down. Continues to report  hopelessness and helplessness, depressed mood and suicidal ideation. He states his suicidal ideation has been present for more than a month. Still reporting decreased appetite, energy and concentration. Denies auditory or visual hallucinations. Denies homicidal ideation. Denies side effects from medications. Denies physical complaints today. Patient states that he has been attending groups.  Nursing staff reports patient slept 8 hours but patient states he only was able to sleep until 3 AM and then woke up and was unable to sleep again  Principal Problem: Major depressive disorder, single episode, severe without psychotic features Diagnosis:   Patient Active Problem List   Diagnosis Date Noted  . Alcohol use disorder, severe, dependence [F10.20] 10/24/2014  . Tobacco use disorder [Z72.0] 10/24/2014  . HTN (hypertension), benign [I10] 10/24/2014  . Major depressive disorder, single episode, severe without psychotic features [F32.2] 10/24/2014  . Alcohol withdrawal [F10.239] 10/23/2014   Total Time spent with patient: 30 minutes   Past Medical History:  Past Medical History  Diagnosis Date  . Hypertension    History reviewed. No pertinent past surgical history. Family History:  Family History  Problem Relation Age of Onset  . Family history unknown: Yes   Social History:  History  Alcohol Use  . Yes    Comment: 80 oz beer daily, 1/2 pint liquor daily     History  Drug Use No    History   Social History  . Marital Status: Divorced    Spouse Name: N/A  . Number of Children: N/A  . Years of  Education: N/A   Social History Main Topics  . Smoking status: Current Every Day Smoker -- 0.50 packs/day for 35 years  . Smokeless tobacco: Never Used  . Alcohol Use: Yes     Comment: 80 oz beer daily, 1/2 pint liquor daily  . Drug Use: No  . Sexual Activity: Not Currently    Birth Control/ Protection: None   Other Topics Concern  . None   Social History Narrative   Additional History:    Sleep: Poor  Appetite:  Poor   Assessment:   Musculoskeletal: Strength & Muscle Tone: within normal limits Gait & Station: normal Patient leans: N/A   Psychiatric Specialty Exam: Physical Exam   Review of Systems  Constitutional: Negative.   HENT: Negative.   Eyes: Negative.   Respiratory: Negative.   Cardiovascular: Negative.   Gastrointestinal: Negative.   Genitourinary: Negative.   Musculoskeletal: Negative.   Skin: Negative.   Endo/Heme/Allergies: Negative.   Psychiatric/Behavioral: Positive for depression and suicidal ideas. The patient has insomnia.     Blood pressure 113/77, pulse 73, temperature 98.3 F (36.8 C), temperature source Oral, resp. rate 20, height 5\' 10"  (1.778 m), weight 73.936 kg (163 lb), SpO2 97 %.Body mass index is 23.39 kg/(m^2).  General Appearance: Disheveled  Eye Solicitor::  Fair  Speech:  Normal Rate  Volume:  Normal  Mood:  Dysphoric  Affect:  Blunt  Thought Process:  Linear  Orientation:  Full (Time, Place, and Person)  Thought Content:  Hallucinations: None  Suicidal Thoughts:  Yes.  without intent/plan  Homicidal Thoughts:  No  Memory:  Immediate;   Fair Recent;   Fair Remote;   Fair  Judgement:  Fair  Insight:  Fair  Psychomotor Activity:  Decreased  Concentration:  Fair  Recall:  NA  Fund of Knowledge:Fair  Language: Good  Akathisia:  No  Handed:    AIMS (if indicated):     Assets:  Manufacturing systems engineer Physical Health Vocational/Educational  ADL's:  Intact  Cognition: WNL  Sleep:  Number of Hours: 8     Current  Medications: Current Facility-Administered Medications  Medication Dose Route Frequency Provider Last Rate Last Dose  . acetaminophen (TYLENOL) tablet 650 mg  650 mg Oral Q6H PRN Audery Amel, MD      . alum & mag hydroxide-simeth (MAALOX/MYLANTA) 200-200-20 MG/5ML suspension 30 mL  30 mL Oral Q4H PRN Audery Amel, MD      . amLODipine (NORVASC) tablet 2.5 mg  2.5 mg Oral Daily Jimmy Footman, MD   2.5 mg at 10/27/14 0811  . citalopram (CELEXA) tablet 10 mg  10 mg Oral QHS Jimmy Footman, MD      . magnesium hydroxide (MILK OF MAGNESIA) suspension 30 mL  30 mL Oral Daily PRN Audery Amel, MD      . nicotine (NICOTROL) 10 MG inhaler 1 continuous puffing  1 continuous puffing Inhalation PRN Brandy Hale, MD   1 continuous puffing at 10/23/14 2207  . traZODone (DESYREL) tablet 150 mg  150 mg Oral QHS Jimmy Footman, MD        Lab Results:  No results found for this or any previous visit (from the past 48 hour(s)).  Physical Findings: AIMS:  , ,  ,  ,    CIWA:  CIWA-Ar Total: 5 COWS:     Treatment Plan Summary: Daily contact with patient to assess and evaluate symptoms and progress in treatment and Medication management   Depressive symptoms: Continue citalopram 10 mg by mouth daily  Alcohol withdrawal: No evidence of alcohol withdrawal at this time will discontinue CIWA and q 8 vitals.  Librium will be discontinued .  Insomnia: Continue trazodone but we will increase the dose to 150 mg by mouth daily at bedtime   Hypertension: Patient has been started on Norvasc 2.5 mg by mouth daily. Continue low sodium diet.  Blood pressure has been well controlled with Norvasc  Tobacco use disorder patient will recall for Nicotrol inhaler when necessary.  Discharge planning once a stable this patient will be discharged to follow-up with substance abuse program. Social worker has arranged for the patient to be interviewed today by a local faith based residential  program.   TSH within the normal limits   Medical Decision Making:  Established Problem, Worsening (2)     Jimmy Footman 10/27/2014, 11:42 AM

## 2014-10-27 NOTE — [facility] Group Notes (Signed)
[facility] Group Notes:  (Nursing/MHT/Case Management/Adjunct)  Date:  10/27/2014  Time:  3:00 PM  Type of Therapy:  Psychoeducational Skills  Participation Level:  Active  Participation Quality:  Appropriate, Attentive and Sharing  Affect:  Appropriate  Cognitive:  Alert and Appropriate  Insight:  Appropriate  Engagement in Group:  Engaged  Modes of Intervention:  Discussion, Education and Support  Summary of Progress/Problems:  Matthew [name] 10/27/2014, 3:00 PM

## 2014-10-27 NOTE — Plan of Care (Signed)
Problem: Ineffective individual coping Goal: LTG: Patient will report a decrease in negative feelings Outcome: Not Progressing Pt continues to report depression and hopelessness Goal: STG:Pt. will utilize relaxation techniques to reduce stress STG: Patient will utilize relaxation techniques to reduce stress levels  Outcome: Not Progressing Pt states unable to relax due to thoughts in head

## 2014-10-27 NOTE — Progress Notes (Signed)
D: Patient denies HI/AVH. Patient endorses passive SI but agrees to contract.  Patient affect is flat. Mood is depressed.  Patient did attend evening group. Patient NOT visible on the milieu. No distress noted. A: Support and encouragement offered. Scheduled medications given to pt. Q 15 min checks continued for patient safety. R: Patient receptive. Patient remains safe on the unit.

## 2014-10-27 NOTE — Progress Notes (Signed)
D: Patient denies HI/AVH. Patient endorses passive SI but agrees to verbal contract for safety. Continues to say he's hopeless . Patient affect is flat. Mood is depressed. Patient did attend evening group. Patient visible on the milieu. No distress noted. A: Support and encouragement offered. Scheduled medications given to pt. Q 15 min checks continued for patient safety. R: Patient receptive. Patient remains safe on the unit.

## 2014-10-27 NOTE — Plan of Care (Signed)
Problem: [facility] Participation in Recreation Therapeutic Interventions Goal: STG-Patient will demonstrate improved self esteem by identif STG: Self-Esteem - Within 4 treatment sessions, patient will verbalize at least 5 positive affirmation statements in each of 2 treatment sessions to increase self-esteem post d/c.  Outcome: Completed/Met Date Met:  10/27/14 Treatment Session 2; Completed 2 out of 2: At approximately 12:50 pm, LRT met with patient in patient room. Patient verbalized 5 positive affirmation statements. Patient reported it felt "good". LRT encouraged patient to continue saying positive affirmation statements.  Leonette Monarch, LRT/CTRS 06.13.16 1:34 pm Goal: STG-Other Recreation Therapy Goal (Specify) STG: Stress Management - Within 5 treatment sessions, patient will demonstrate at least one stress management technique in one treatment session to increase stress management skills post d/c.  Outcome: Not Applicable Date Met:  28/31/51 Treatment Session 2; Completed 0 out of 1: At approximately 12:50 pm, LRT met with patient in patient room. Patient reported he had not read over the stress management techniques because he did not have his reading glasses. LRT offered to make print larger, but patient reported he needed his reading glasses. LRT encouraged patient to read over and practice the stress management techniques when he had his reading glasses.  Leonette Monarch, LRT/CTRS 06.13.16 1:37 pm

## 2014-10-27 NOTE — Progress Notes (Signed)
Recreation Therapy Notes  Date: 06.13.16 Time: 3:10 pm Location: Craft Room  Group Topic: Self-expression  Goal Area(s) Addresses:  Patient will be able to identify a color that represents each emotion. Patient will verbalize benefit of using art as a means of self-expression. Patient will verbalize one positive emotion experienced while participating in group.  Behavioral Response: Attentive, Interactive  Intervention: The Colors Within Me  Activity: Patients were given a blank face and instructed to analyze what emotions they were feeling. For each emotion they felt, they were instructed to pick a color and show how much of that emotion they were feeling on the worksheet.  Education: LRT educated patients on different forms of self-expression.   Education Outcome: Acknowledges education/In group clarification offered  Clinical Observations/Feedback: Patient completed activity. Patient contributed to group discussion by stating what emotions he was feeling, and how the emotions affect his treatment in the hospital.  Jacquelynn Cree, LRT/CTRS 10/27/2014 4:26 PM

## 2014-10-28 ENCOUNTER — Inpatient Hospital Stay: Payer: No Typology Code available for payment source

## 2014-10-28 NOTE — [facility] Group Notes (Signed)
[facility] LCSW Group Therapy  10/28/2014 12:43 PM  Type of Therapy:  Group Therapy  Participation Level:  Active  Participation Quality:  Attentive  Affect:  Appropriate  Cognitive:  Appropriate  Insight:  Engaged  Engagement in Therapy:  Engaged  Modes of Intervention:  Discussion, Education, Exploration and Support  Summary of Progress/Problems:LCSW introduced group rules and today's discussion was on Understanding SELF, patients as a whole group were asked to explore their false beliefs, how they address them to form insight about issues,thoughts or feelings. Each person was asked to reflect on what is important to them and communications styles were discussed to assist with individuals reaching their goals of self discovery. This patient was insightful and supportive of  peers.   Matthew [name] M 10/28/2014, 12:43 PM

## 2014-10-28 NOTE — Plan of Care (Signed)
Problem: Alteration in mood & ability to function due to Goal: LTG-Pt reports reduction in suicidal thoughts (Patient reports reduction in suicidal thoughts and is able to verbalize a safety plan for whenever patient is feeling suicidal)  Outcome: Not Progressing Patient continues to have thoughts of suicide, but contracts for safety.

## 2014-10-28 NOTE — Plan of Care (Signed)
Problem: Ineffective individual coping Goal: STG: Patient will remain free from self harm Outcome: Not Progressing Patient continues to have thoughts of suicide, but denies having a plan, and contracts for safety.     

## 2014-10-28 NOTE — Progress Notes (Signed)
Isolates to self and room. States feel a little better. Continues to have passive SI, no plan, verbally contracts for safety. Encouraged pt to attend group and verbalize feelings. Pt request to see chaplain. Chaplain in this afternoon for counseling. No negative behaviors noted. Will continue to assess and monitor for safety.

## 2014-10-28 NOTE — Progress Notes (Signed)
Recreation Therapy Notes  Date: 06.14.16 Time: 3:00 pm Location: Craft Room  Group Topic: Goal Setting  Goal Area(s) Addresses:  Patient will list at least one goal. Patient will list at least one obstacle.  Behavioral Response: Did not attend  Intervention: Recovery Goal Chart  Activity: Patients were instructed to make a goal chart with goals, obstacles, the date they started working on their goal, and the date they achieved their goal.   Education: LRT educated patients on different healthy ways they can celebrate reaching their goal.   Education Outcome: Patient did not attend group.  Clinical Observations/Feedback: Patient did not attend group.  Jacquelynn Cree, LRT/CTRS 10/28/2014 4:21 PM

## 2014-10-28 NOTE — [facility] Group Notes (Signed)
[facility] Group Notes:  (Nursing/MHT/Case Management/Adjunct)  Date:  10/28/2014  Time:  12:58 AM  Type of Therapy:  Group Therapy  Participation Level:  Active  Participation Quality:  Appropriate  Affect:  Appropriate  Cognitive:  Appropriate  Insight:  Appropriate  Engagement in Group:  Engaged  Modes of Intervention:  n/a  Summary of Progress/Problems:  Matthew [name] 10/28/2014, 12:58 AM

## 2014-10-28 NOTE — Progress Notes (Signed)
Patient rates his depression a "10"; continues to have SI, but denies having a plan, and contracts for safety; states "I have thoughts of suicide everyday." Patient denies any anxiety; has been calm and cooperative throughout the shift; no further voiced complaints; continue to monitor.

## 2014-10-28 NOTE — Progress Notes (Signed)
[facility] MD Progress Note  10/28/2014 1:27 PM Matthew [name]  MRN:  [id number] Subjective:  Patient reports some improvement today. He states he did sleep better last night. . Patient feels guilty about relapsing. He feels that he let people down. Continues to report  hopelessness and helplessness, depressed mood and suicidal ideation. He states his suicidal ideation has been present for more than a month. Still reporting decreased appetite, energy and concentration. Denies auditory or visual hallucinations. Denies homicidal ideation. Denies side effects from medications. As far as physical complaints he reports constipation. Patient states that he has been attending groups.  Nursing staff reports patient slept 6 hours.  Principal Problem: Major depressive disorder, single episode, severe without psychotic features Diagnosis:   Patient Active Problem List   Diagnosis Date Noted  . Alcohol use disorder, severe, dependence [F10.20] 10/24/2014  . Tobacco use disorder [Z72.0] 10/24/2014  . HTN (hypertension), benign [I10] 10/24/2014  . Major depressive disorder, single episode, severe without psychotic features [F32.2] 10/24/2014  . Alcohol withdrawal [F10.239] 10/23/2014   Total Time spent with patient: 30 minutes   Past Medical History:  Past Medical History  Diagnosis Date  . Hypertension    History reviewed. No pertinent past surgical history. Family History:  Family History  Problem Relation Age of Onset  . Family history unknown: Yes   Social History:  History  Alcohol Use  . Yes    Comment: 80 oz beer daily, 1/2 pint liquor daily     History  Drug Use No    History   Social History  . Marital Status: Divorced    Spouse Name: N/A  . Number of Children: N/A  . Years of Education: N/A   Social History Main Topics  . Smoking status: Current Every Day Smoker -- 0.50 packs/day for 35 years  . Smokeless tobacco: Never Used  . Alcohol Use: Yes     Comment: 80 oz beer daily, 1/2  pint liquor daily  . Drug Use: No  . Sexual Activity: Not Currently    Birth Control/ Protection: None   Other Topics Concern  . None   Social History Narrative   Additional History:    Sleep: Fair  Appetite:  Fair   Assessment:   Musculoskeletal: Strength & Muscle Tone: within normal limits Gait & Station: normal Patient leans: N/A   Psychiatric Specialty Exam: Physical Exam   Review of Systems  Constitutional: Negative.   HENT: Negative.   Eyes: Negative.   Respiratory: Negative.   Cardiovascular: Negative.   Gastrointestinal: Positive for constipation.  Genitourinary: Negative.   Musculoskeletal: Negative.   Skin: Negative.   Endo/Heme/Allergies: Negative.   Psychiatric/Behavioral: Positive for depression and suicidal ideas. The patient has insomnia.     Blood pressure 114/76, pulse 78, temperature 98 F (36.7 C), temperature source Oral, resp. rate 20, height 5\' 10"  (1.778 m), weight 73.936 kg (163 lb), SpO2 97 %.Body mass index is 23.39 kg/(m^2).  General Appearance: Disheveled  Eye Solicitor::  Fair  Speech:  Normal Rate  Volume:  Normal  Mood:  Dysphoric  Affect:  Blunt  Thought Process:  Linear  Orientation:  Full (Time, Place, and Person)  Thought Content:  Hallucinations: None  Suicidal Thoughts:  Yes.  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 acetaminophen (TYLENOL) tablet 650 mg  650 mg Oral Q6H PRN Audery Amel,  MD      . alum & mag hydroxide-simeth (MAALOX/MYLANTA) 200-200-20 MG/5ML suspension 30 mL  30 mL Oral Q4H PRN Audery Amel, MD      . amLODipine (NORVASC) tablet 2.5 mg  2.5 mg Oral Daily Jimmy Footman, MD   2.5 mg at 10/28/14 0829  . citalopram (CELEXA) tablet 10 mg  10 mg Oral QHS Jimmy Footman, MD   10 mg at 10/27/14 2158  . magnesium hydroxide (MILK OF MAGNESIA) suspension 30 mL  30 mL Oral Daily PRN Audery Amel, MD      . nicotine (NICOTROL) 10 MG inhaler 1 continuous puffing  1 continuous puffing Inhalation PRN Brandy Hale, MD   1 continuous puffing at 10/23/14 2207  . traZODone (DESYREL) tablet 150 mg  150 mg Oral QHS Jimmy Footman, MD   150 mg at 10/27/14 2157    Lab Results:  No results found for this or any previous visit (from the past 48 hour(s)).  Physical Findings: AIMS:  , ,  ,  ,    CIWA:  CIWA-Ar Total: 5 COWS:     Treatment Plan Summary: Daily contact with patient to assess and evaluate symptoms and progress in treatment and Medication management   Depressive symptoms: Continue citalopram 10 mg by mouth daily  Alcohol withdrawal: No evidence of alcohol withdrawal at this time will discontinue CIWA and q 8 vitals.  Librium will be discontinued .  Insomnia: Continue trazodone 150 mg by mouth daily at bedtime  Hypertension: Patient has been started on Norvasc 2.5 mg by mouth daily. Continue low sodium diet.  Blood pressure has been well controlled with Norvasc  Tobacco use disorder patient will recall for Nicotrol inhaler when necessary.  Constipation: will start prune juice with all meals.  Discharge planning once a stable this patient will be discharged to follow-up with substance abuse program. Social worker had arranged for the patient to be interviewed yesterday by a local faith based residential program. They contacted the social worker today to say that the patient was not going to be accepted, the reasons for the denial  are unknown at this time. Social worker will make a referral for another residential treatment call [facility].    Will order Chest xray as [facility] will need to r/o TB.   TSH within the normal limits   Medical Decision Making:  Established Problem, Worsening (2)     Jimmy Footman 10/28/2014, 1:27 PM

## 2014-10-28 NOTE — Plan of Care (Signed)
Problem: Ineffective individual coping Goal: STG: Pt will be able to identify effective and ineffective STG: Pt will be able to identify effective and ineffective coping patterns  Outcome: Not Progressing Pt still offers minimal conversation Goal: STG: Patient will remain free from self harm Outcome: Progressing No harm reported or observed

## 2014-10-28 NOTE — Progress Notes (Signed)
   10/28/14 1500  Clinical Encounter Type  Visited With Patient  Visit Type Spiritual support  Referral From Physician  Consult/Referral To Chaplain  Spiritual Encounters  Spiritual Needs Prayer;Emotional  Stress Factors  Patient Stress Factors Exhausted;Financial concerns;Health changes;Major life changes  Family Stress Factors None identified  Advance Directives (For Healthcare)  Does patient have an advance directive? No  Would patient like information on creating an advanced directive? No - patient declined information   Chaplain provided therapeutic presence, empathic listening, emotional support and prayer.  AD [phone]

## 2014-10-29 NOTE — Progress Notes (Signed)
D: Patient denies SI/HI/AVH. Patient affect is sad. Mood is depressed.  Patient thinks he is too old to "get myself together."  He feels he has let everyone down by relapsing.  Patient did attend evening group. Patient visible on the milieu. No distress noted. A: Support and encouragement offered. Scheduled medications given to pt. Q 15 min checks continued for patient safety. R: Patient receptive. Patient remains safe on the unit.

## 2014-10-29 NOTE — Plan of Care (Signed)
Problem: Alteration in mood & ability to function due to Goal: LTG-Pt verbalizes understanding of importance of med regimen (Patient verbalizes understanding of importance of medication regimen and need to continue outpatient care and support groups)  Outcome: Progressing Compliant with medication, instructions , verbalize understanding

## 2014-10-29 NOTE — Plan of Care (Signed)
Problem: [facility] Participation in Recreation Therapeutic Interventions Goal: STG-Patient will demonstrate improved self esteem by identif STG: Self-Esteem - Within 4 treatment sessions, patient will verbalize at least 5 positive affirmation statements in each of 3 treatment sessions to increase self-esteem post d/c. Outcome: Progressing Treatment Session 1; Completed 1 out of 2: At approximately 1:05 pm, LRT met with patient in patient room. Patient verbalized 5 positive affirmation statements. Patient reported it was "feeling a lot better". LRT encouraged patient to continue saying positive affirmation statements.  Leonette Monarch, LRT/CTRS 06.15.16 4:47 pm

## 2014-10-29 NOTE — Progress Notes (Signed)
Recreation Therapy Notes  Date: 06.15.16 Time: 3:00 pm Location: Craft Room  Group Topic: Self-esteem, coping skills  Goal Area(s) Addresses:  Patient will identify positive attributes about self. Patient will identify at least one coping skill.  Behavioral Response: Attentive, Interactive  Intervention: All About Me  Activity: Patients were instructed to make an All About Me pamphlet including positive traits, healthy coping skills, and their healthy support system.  Education:LRT educated patients on ways to increase their self-esteem.  Education Outcome: Acknowledges education/In group clarification offered  Clinical Observations/Feedback: Patient completed activity listing positive traits, healthy coping skills, and her healthy support system. Patient contributed to group discussion by stating that it was hard to think of positive traits and why, and why it is important to use healthy coping skills.  Jacquelynn Cree, LRT/CTRS 10/29/2014 4:37 PM

## 2014-10-29 NOTE — Progress Notes (Signed)
D: Patient stated sleptfairlast night .Stated appetite is good and energy level  Is normal. Stated concentration is good . Stated on Depression 8, hopeless 10 and anxiety 8 . ( low 0-10 high)Patient voice of being  suicidal .  No auditory hallucinations  No pain concerns . Appropriate ADL'S. Interacting with peers and staff. Stated he had to contact his family .  A: Encourage patient participation with unit programming . Instruction  Given on  Medication , verbalize understanding. Encourage patient to work on Pharmacologist  R: Voice no other concerns. Staff continue to monitor

## 2014-10-29 NOTE — Progress Notes (Signed)
[facility] MD Progress Note  10/29/2014 12:13 PM Matthew [name]  MRN:  [id number] Subjective:  Patient reports improvement today. He states he did sleep better last night. . Patient is feeling less hopeless and less helpless. He still has significant guilt about relapse and has not been able to contact his family members. He feels that he let people down. Patient continues to have passive thoughts of suicidality. His sleep, energy, appetite and concentration are improving.  Denies auditory or visual hallucinations. Denies homicidal ideation. Denies side effects from medications. As far as physical complaints he reports constipation. Patient states that he has been attending groups.  Per nursing: Patient denies SI/HI/AVH. Patient affect is sad. Mood is depressed. Patient thinks he is too old to "get myself together." He feels he has let everyone down by relapsing. Patient did attend evening group. Patient visible on the milieu. No distress noted. A: Support and encouragement offered. Scheduled medications given to pt. Q 15 min checks continued for patient safety. R: Patient receptive. Patient remains safe on the unit.   Nursing staff reports patient slept 6.7 hours.  Principal Problem: Major depressive disorder, single episode, severe without psychotic features Diagnosis:   Patient Active Problem List   Diagnosis Date Noted  . Alcohol use disorder, severe, dependence [F10.20] 10/24/2014  . Tobacco use disorder [Z72.0] 10/24/2014  . HTN (hypertension), benign [I10] 10/24/2014  . Major depressive disorder, single episode, severe without psychotic features [F32.2] 10/24/2014  . Alcohol withdrawal [F10.239] 10/23/2014   Total Time spent with patient: 30 minutes   Past Medical History:  Past Medical History  Diagnosis Date  . Hypertension    History reviewed. No pertinent past surgical history. Family History:  Family History  Problem Relation Age of Onset  . Family history unknown: Yes   Social  History:  History  Alcohol Use  . Yes    Comment: 80 oz beer daily, 1/2 pint liquor daily     History  Drug Use No    History   Social History  . Marital Status: Divorced    Spouse Name: N/A  . Number of Children: N/A  . Years of Education: N/A   Social History Main Topics  . Smoking status: Current Every Day Smoker -- 0.50 packs/day for 35 years  . Smokeless tobacco: Never Used  . Alcohol Use: Yes     Comment: 80 oz beer daily, 1/2 pint liquor daily  . Drug Use: No  . Sexual Activity: Not Currently    Birth Control/ Protection: None   Other Topics Concern  . None   Social History Narrative   Additional History:    Sleep: Fair  Appetite:  Fair   Assessment:   Musculoskeletal: Strength & Muscle Tone: within normal limits Gait & Station: normal Patient leans: N/A   Psychiatric Specialty Exam: Physical Exam   Review of Systems  Constitutional: Negative.   HENT: Negative.   Eyes: Negative.   Respiratory: Negative.   Cardiovascular: Negative.   Gastrointestinal: Positive for constipation.  Genitourinary: Negative.   Musculoskeletal: Negative.   Skin: Negative.   Endo/Heme/Allergies: Negative.   Psychiatric/Behavioral: Positive for depression and suicidal ideas. The patient has insomnia.     Blood pressure 108/74, pulse 66, temperature 98.2 F (36.8 C), temperature source Oral, resp. rate 20, height 5\' 10"  (1.778 m), weight 73.936 kg (163 lb), SpO2 97 %.Body mass index is 23.39 kg/(m^2).  General Appearance: Disheveled  Eye Contact::  Fair  Speech:  Normal Rate  Volume:  Normal  Mood:  Dysphoric  Affect:  Blunt  Thought Process:  Linear  Orientation:  Full (Time, Place, and Person)  Thought Content:  Hallucinations: None  Suicidal Thoughts:  Yes.  without intent/plan  Homicidal Thoughts:  No  Memory:  Immediate;   Fair Recent;   Fair Remote;   Fair  Judgement:  Fair  Insight:  Fair  Psychomotor Activity:  Decreased  Concentration:  Fair   Recall:  NA  Fund of Knowledge:Fair  Language: Good  Akathisia:  No  Handed:    AIMS (if indicated):     Assets:  Manufacturing systems engineer Physical Health Vocational/Educational  ADL's:  Intact  Cognition: WNL  Sleep:  Number of Hours: 6.75     Current Medications: Current Facility-Administered Medications  Medication Dose Route Frequency Provider Last Rate Last Dose  . acetaminophen (TYLENOL) tablet 650 mg  650 mg Oral Q6H PRN Audery Amel, MD      . alum & mag hydroxide-simeth (MAALOX/MYLANTA) 200-200-20 MG/5ML suspension 30 mL  30 mL Oral Q4H PRN Audery Amel, MD      . amLODipine (NORVASC) tablet 2.5 mg  2.5 mg Oral Daily Jimmy Footman, MD   2.5 mg at 10/29/14 1001  . citalopram (CELEXA) tablet 10 mg  10 mg Oral QHS Jimmy Footman, MD   10 mg at 10/28/14 2107  . magnesium hydroxide (MILK OF MAGNESIA) suspension 30 mL  30 mL Oral Daily PRN Audery Amel, MD      . nicotine (NICOTROL) 10 MG inhaler 1 continuous puffing  1 continuous puffing Inhalation PRN Brandy Hale, MD   1 continuous puffing at 10/23/14 2207  . traZODone (DESYREL) tablet 150 mg  150 mg Oral QHS Jimmy Footman, MD   150 mg at 10/28/14 2107    Lab Results:  No results found for this or any previous visit (from the past 48 hour(s)).  Physical Findings: AIMS:  , ,  ,  ,    CIWA:  CIWA-Ar Total: 5 COWS:     Treatment Plan Summary: Daily contact with patient to assess and evaluate symptoms and progress in treatment and Medication management   Depressive symptoms: Continue citalopram 10 mg by mouth daily  Alcohol withdrawal: No evidence of alcohol withdrawal at this time will discontinue CIWA and q 8 vitals.  Librium will be discontinued .  Insomnia: Continue trazodone 150 mg by mouth daily at bedtime  Hypertension: Patient has been started on Norvasc 2.5 mg by mouth daily. Continue low sodium diet.  Blood pressure has been well controlled with Norvasc  Tobacco use  disorder patient will recall for Nicotrol inhaler when necessary.  Constipation: will start prune juice with all meals and will add miralax prn.  Discharge planning once a stable this patient will be discharged to follow-up with substance abuse program. Social worker had arranged for the patient to be interviewed Monday by a local faith based residential program. They contacted the social worker yesterday to say that the patient was not going to be accepted, the reasons for the denial are unknown at this time. Social worker made a referral for another residential treatment, [facility].  Pending outcome form phone interview  Chest Xray wnl no evidence of TB   TSH within the normal limits   Medical Decision Making:  Established Problem, Worsening (2)     Jimmy Footman 10/29/2014, 12:13 PM

## 2014-10-29 NOTE — [facility] Group Notes (Signed)
[facility] Group Notes:  (Nursing/MHT/Case Management/Adjunct)  Date:  10/29/2014  Time:  12:04 PM  Type of Therapy:  Psychoeducational Skills  Participation Level:  Active  Participation Quality:  Appropriate and Attentive  Affect:  Appropriate  Cognitive:  Appropriate and Oriented  Insight:  Appropriate and Good  Engagement in Group:  Engaged  Modes of Intervention:  Discussion, Education, Problem-solving and Support  Summary of Progress/Problems:  Lynelle Smoke Xylia Scherger 10/29/2014, 12:04 PM

## 2014-10-30 DIAGNOSIS — L309 Dermatitis, unspecified: Secondary | ICD-10-CM

## 2014-10-30 MED ORDER — CITALOPRAM HYDROBROMIDE 10 MG PO TABS: 10.0000 mg | ORAL_TABLET | Freq: Every day | ORAL | Status: DC

## 2014-10-30 MED ORDER — HYDROCORTISONE 0.5 % EX CREA
TOPICAL_CREAM | Freq: Three times a day (TID) | CUTANEOUS | Status: DC
  Administered 2014-10-30 – 2014-11-02 (×8): via TOPICAL
  Filled 2014-10-30 (×2): qty 28.35

## 2014-10-30 MED ORDER — AMLODIPINE BESYLATE 2.5 MG PO TABS: 2.5000 mg | ORAL_TABLET | Freq: Every day | ORAL | Status: DC

## 2014-10-30 MED ORDER — TRAZODONE HCL 150 MG PO TABS: 150.0000 mg | ORAL_TABLET | Freq: Every day | ORAL | Status: DC

## 2014-10-30 MED ORDER — HYDROCORTISONE 0.5 % EX CREA: 1.0000 "application " | TOPICAL_CREAM | Freq: Two times a day (BID) | CUTANEOUS | Status: DC

## 2014-10-30 MED ORDER — CITALOPRAM HYDROBROMIDE 20 MG PO TABS
20.0000 mg | ORAL_TABLET | Freq: Every day | ORAL | Status: DC
  Administered 2014-10-30 – 2014-11-02 (×4): 20 mg via ORAL
  Filled 2014-10-30 (×4): qty 1

## 2014-10-30 NOTE — [facility] Group Notes (Signed)
[facility] LCSW Group Therapy  10/30/2014 3:33 PM  Type of Therapy:  Group Therapy  Participation Level:  Active  Participation Quality:  Attentive  Affect:  Appropriate  Cognitive:  Appropriate  Insight:  Developing/Improving  Engagement in Therapy:  Improving  Modes of Intervention:  Discussion, Education, Exploration and Support  Summary of Progress/Problems: LCSW group rules were introduced and reviewed. Patients group therapy today was to reflect and discuss Truth, Honesty and Trust issues. Each patient was asked to disclose a time when they were lies to, or a time when they lied. Patients were asked to reflect on their thoughts, feelings and emotions on these topics and support their peers. This patient was attentive and agreed their are times when lying is OK example giving you never tell your wife she has put on weight. Several examples of white lies were used and dilemmas that arise.  Arrie Senate M 10/30/2014, 3:33 PM

## 2014-10-30 NOTE — [facility] Group Notes (Signed)
[facility] Group Notes:  (Nursing/MHT/Case Management/Adjunct)  Date:  10/30/2014  Time:  12:52 AM  Type of Therapy:  Group Therapy  Participation Level:  Active  Participation Quality:  Appropriate  Affect:  Appropriate  Cognitive:  Appropriate  Insight:  Good  Engagement in Group:  Engaged  Modes of Intervention:  n/a  Summary of Progress/Problems:  Matthew [name] Matthew [name] Matthew [name] 10/30/2014, 12:52 AM 

## 2014-10-30 NOTE — Progress Notes (Signed)
[facility] MD Progress Note  10/30/2014 12:25 PM Matthew [name]  MRN:  [id number] Subjective:  Patient reports improvement today. He states he did sleep better last night. . Patient is feeling less hopeless and less helpless. He still has significant guilt about relapse and has not been able to contact his family members. He feels that he let people down. Patient continues to have passive thoughts of suicidality. His sleep, energy, appetite and concentration are improving.  Denies auditory or visual hallucinations. Denies homicidal ideation. Denies side effects from medications. Patient denies any physical complaints today. Patient states that he has been attending groups.  Per nursing: D: Patient stated sleptfairlast night .Stated appetite is good and energy level Is normal. Stated concentration is good . Stated on Depression 8, hopeless 10 and anxiety 8 . ( low 0-10 high)Patient voice of being suicidal . No auditory hallucinations No pain concerns . Appropriate ADL'S. Interacting with peers and staff. Stated he had to contact his family .  Nursing staff reports patient slept 7 hours.  Principal Problem: Major depressive disorder, single episode, severe without psychotic features Diagnosis:   Patient Active Problem List   Diagnosis Date Noted  . Eczema [L30.9] 10/30/2014  . Alcohol use disorder, severe, dependence [F10.20] 10/24/2014  . Tobacco use disorder [Z72.0] 10/24/2014  . HTN (hypertension), benign [I10] 10/24/2014  . Major depressive disorder, single episode, severe without psychotic features [F32.2] 10/24/2014  . Alcohol withdrawal [F10.239] 10/23/2014   Total Time spent with patient: 30 minutes   Past Medical History:  Past Medical History  Diagnosis Date  . Hypertension    History reviewed. No pertinent past surgical history. Family History:  Family History  Problem Relation Age of Onset  . Family history unknown: Yes   Social History:  History  Alcohol Use  . Yes   Comment: 80 oz beer daily, 1/2 pint liquor daily     History  Drug Use No    History   Social History  . Marital Status: Divorced    Spouse Name: N/A  . Number of Children: N/A  . Years of Education: N/A   Social History Main Topics  . Smoking status: Current Every Day Smoker -- 0.50 packs/day for 35 years  . Smokeless tobacco: Never Used  . Alcohol Use: Yes     Comment: 80 oz beer daily, 1/2 pint liquor daily  . Drug Use: No  . Sexual Activity: Not Currently    Birth Control/ Protection: None   Other Topics Concern  . None   Social History Narrative   Additional History:    Sleep: Good  Appetite:  Fair   Assessment:   Musculoskeletal: Strength & Muscle Tone: within normal limits Gait & Station: normal Patient leans: N/A   Psychiatric Specialty Exam: Physical Exam   Review of Systems  Constitutional: Negative.   HENT: Negative.   Eyes: Negative.   Respiratory: Negative.   Cardiovascular: Negative.   Gastrointestinal: Negative for constipation.  Genitourinary: Negative.   Musculoskeletal: Negative.   Skin: Negative.   Endo/Heme/Allergies: Negative.   Psychiatric/Behavioral: Positive for depression and suicidal ideas. The patient has insomnia.     Blood pressure 120/79, pulse 66, temperature 98.2 F (36.8 C), temperature source Oral, resp. rate 20, height 5\' 10"  (1.778 m), weight 73.936 kg (163 lb), SpO2 97 %.Body mass index is 23.39 kg/(m^2).  General Appearance: Disheveled  Eye Solicitor::  Fair  Speech:  Normal Rate  Volume:  Normal  Mood:  Dysphoric  Affect:  Blunt  Thought Process:  Linear  Orientation:  Full (Time, Place, and Person)  Thought Content:  Hallucinations: None  Suicidal Thoughts:  Yes.  without intent/plan  Homicidal Thoughts:  No  Memory:  Immediate;   Fair Recent;   Fair Remote;   Fair  Judgement:  Fair  Insight:  Fair  Psychomotor Activity:  Decreased  Concentration:  Fair  Recall:  NA  Fund of Knowledge:Fair  Language:  Good  Akathisia:  No  Handed:    AIMS (if indicated):     Assets:  Manufacturing systems engineer Physical Health Vocational/Educational  ADL's:  Intact  Cognition: WNL  Sleep:  Number of Hours: 7     Current Medications: Current Facility-Administered Medications  Medication Dose Route Frequency Provider Last Rate Last Dose  . acetaminophen (TYLENOL) tablet 650 mg  650 mg Oral Q6H PRN Audery Amel, MD      . alum & mag hydroxide-simeth (MAALOX/MYLANTA) 200-200-20 MG/5ML suspension 30 mL  30 mL Oral Q4H PRN Audery Amel, MD      . amLODipine (NORVASC) tablet 2.5 mg  2.5 mg Oral Daily Jimmy Footman, MD   2.5 mg at 10/30/14 0953  . citalopram (CELEXA) tablet 10 mg  10 mg Oral QHS Jimmy Footman, MD   10 mg at 10/29/14 2139  . magnesium hydroxide (MILK OF MAGNESIA) suspension 30 mL  30 mL Oral Daily PRN Audery Amel, MD      . nicotine (NICOTROL) 10 MG inhaler 1 continuous puffing  1 continuous puffing Inhalation PRN Brandy Hale, MD   1 continuous puffing at 10/23/14 2207  . traZODone (DESYREL) tablet 150 mg  150 mg Oral QHS Jimmy Footman, MD   150 mg at 10/29/14 2139    Lab Results:  No results found for this or any previous visit (from the past 48 hour(s)).  Physical Findings: AIMS:  , ,  ,  ,    CIWA:  CIWA-Ar Total: 5 COWS:     Treatment Plan Summary: Daily contact with patient to assess and evaluate symptoms and progress in treatment and Medication management   Depressive symptoms: Continue citalopram  But will increase to 20 mg by mouth daily  Alcohol withdrawal: No evidence of alcohol withdrawal at this time will discontinue CIWA and q 8 vitals.  Librium will be discontinued .  Insomnia: Continue trazodone 150 mg by mouth daily at bedtime  Hypertension: Patient has been started on Norvasc 2.5 mg by mouth daily. Continue low sodium diet.  Blood pressure has been well controlled with Norvasc  Tobacco use disorder patient will recall for  Nicotrol inhaler when necessary.  Constipation: will start prune juice with all meals and will add miralax prn.  Eczema: will start hydrocortisone 0.5 % tid.  Discharge planning once a stable this patient will be discharged to follow-up with substance abuse program. Social worker had arranged for the patient to be interviewed Monday by a local faith based residential program. They contacted the social worker yesterday to say that the patient was not going to be accepted, the reasons for the denial are unknown at this time. Social worker made a referral for another residential treatment, [facility].  Patient has been accepted to their program however they will not have an opening until June 21. Per their arrangement with [facility] patients are not allowed to be admitted from outside the hospital and therefore the patient will have to remain in our facility until the 21st.    Chest Xray wnl no evidence of TB  TSH within the normal limits   Medical Decision Making:  Established Problem, Worsening (2)     Jimmy Footman 10/30/2014, 12:25 PM

## 2014-10-30 NOTE — [facility] Suicide Risk Assessment (Signed)
[facility] Discharge Suicide Risk Assessment   Demographic Factors:  Male, Low socioeconomic status and Unemployed  Total Time spent with patient: 30 minutes  Musculoskeletal: Strength & Muscle Tone: within normal limits Gait & Station: normal Patient leans: N/A  Psychiatric Specialty Exam: Physical Exam  ROS                                                         Have you used any form of tobacco in the last 30 days? (Cigarettes, Smokeless Tobacco, Cigars, and/or Pipes): Yes  Has this patient used any form of tobacco in the last 30 days? (Cigarettes, Smokeless Tobacco, Cigars, and/or Pipes) Yes, A prescription for an FDA-approved tobacco cessation medication was offered at discharge and the patient refused  Mental Status Per Nursing Assessment::   On Admission:  Suicidal ideation indicated by patient, Suicide plan  Current Mental Status by Physician: denies SI, HI or A/VH.  Hopeful and future oriented  Loss Factors: Legal issues and Financial problems/change in socioeconomic status  Historical Factors: Impulsivity  Risk Reduction Factors:   Sense of responsibility to family and Religious beliefs about death  Continued Clinical Symptoms:  Depression:   Comorbid alcohol abuse/dependence Impulsivity Alcohol/Substance Abuse/Dependencies  Cognitive Features That Contribute To Risk:  None    Suicide Risk:  Minimal: No identifiable suicidal ideation.  Patients presenting with no risk factors but with morbid ruminations; may be classified as minimal risk based on the severity of the depressive symptoms  Principal Problem: Major depressive disorder, single episode, severe without psychotic features Discharge Diagnoses:  Patient Active Problem List   Diagnosis Date Noted  . Alcohol use disorder, severe, dependence [F10.20] 10/24/2014  . Tobacco use disorder [Z72.0] 10/24/2014  . HTN (hypertension), benign [I10] 10/24/2014  . Major depressive disorder,  single episode, severe without psychotic features [F32.2] 10/24/2014  . Alcohol withdrawal [F10.239] 10/23/2014      Plan Of Care/Follow-up recommendations:  Other:  f/u with admission to [facility] on Monday June 20  Is patient on multiple antipsychotic therapies at discharge:  No   Has Patient had three or more failed trials of antipsychotic monotherapy by history:  No  Recommended Plan for Multiple Antipsychotic Therapies: NA    Jimmy Footman 10/30/2014, 10:16 AM

## 2014-10-30 NOTE — Plan of Care (Signed)
Problem: [facility] Participation in Recreation Therapeutic Interventions Goal: STG-Patient will demonstrate improved self esteem by identif STG: Self-Esteem - Within 4 treatment sessions, patient will verbalize at least 5 positive affirmation statements in each of 3 treatment sessions to increase self-esteem post d/c.  Outcome: Progressing Treatment Session 2; Completed 2 out of 3: At approximately 9:35 am, LRT met with patient in patient room. Patient verbalized 5 positive affirmation statements. Patient reported it was "feeling better". LRT encouraged patient to continue saying positive affirmation statements.  Leonette Monarch, LRT/CTRS 06.16.16 1:27 pm

## 2014-10-30 NOTE — Discharge Summary (Addendum)
Physician Discharge Summary Note  Patient:  Matthew [name] is an 54 y.o., male MRN:  [id number] DOB:  [birth year] Patient phone:  [phone] (home)  Patient address:   [place] [address],  Total Time spent with patient: 30 minutes  Date of Admission:  10/23/2014 Date of Discharge: 11/03/2014  Reason for Admission:  Suicidal ideation  Principal Problem: Major depressive disorder, single episode, severe without psychotic features Discharge Diagnoses: Patient Active Problem List   Diagnosis Date Noted  . Eczema [L30.9] 10/30/2014  . Alcohol use disorder, severe, dependence [F10.20] 10/24/2014  . Tobacco use disorder [Z72.0] 10/24/2014  . HTN (hypertension), benign [I10] 10/24/2014  . Major depressive disorder, single episode, severe without psychotic features [F32.2] 10/24/2014  . Alcohol withdrawal [F10.239] 10/23/2014    Musculoskeletal: Strength & Muscle Tone: within normal limits Gait & Station: normal Patient leans: N/A  Psychiatric Specialty Exam: Physical Exam  Review of Systems  Constitutional: Negative.   HENT: Negative.   Eyes: Negative.   Respiratory: Negative.   Cardiovascular: Negative.   Gastrointestinal: Negative.   Genitourinary: Negative.   Musculoskeletal: Negative.   Skin: Negative.   Neurological: Negative.   Endo/Heme/Allergies: Negative.   Psychiatric/Behavioral: Positive for substance abuse. Negative for depression, suicidal ideas and hallucinations. The patient is not nervous/anxious and does not have insomnia.     Blood pressure 147/85, pulse 63, temperature 98 F (36.7 C), temperature source Oral, resp. rate 20, height '5\' 10"'  (1.778 m), weight 73.936 kg (163 lb), SpO2 97 %.Body mass index is 23.39 kg/(m^2).  General Appearance: Fairly Groomed  Engineer, water::  Good  Speech:  Normal Rate  Volume:  Normal  Mood:  Dysphoric  Affect:  Appropriate and Congruent  Thought Process:  Linear  Orientation:  Full (Time,  Place, and Person)  Thought Content:  Hallucinations: None  Suicidal Thoughts:  No  Homicidal Thoughts:  No  Memory:  Immediate;   Good Recent;   Good Remote;   Good  Judgement:  Good  Insight:  Good  Psychomotor Activity:  Normal  Concentration:  Good  Recall:  NA  Fund of Knowledge:Good  Language: Good  Akathisia:  No  Handed:    AIMS (if indicated):     Assets:  Communication Skills Desire for Improvement Physical Health  ADL's:  Intact  Cognition: WNL  Sleep:  Number of Hours: 7.15   Have you used any form of tobacco in the last 30 days? (Cigarettes, Smokeless Tobacco, Cigars, and/or Pipes): Yes   Has this patient used any form of tobacco in the last 30 days? (Cigarettes, Smokeless Tobacco, Cigars, and/or Pipes) Yes, A prescription for an FDA-approved tobacco cessation medication was offered at discharge and the patient refused  History of Present Illness:  Matthew [name] is a 54 y.o. male who presented to the emergency department on 6/9 under IVC for concerns for depression and suicidal ideation. "The patient states that he feels like hurting everybody he looks. He states that he wants to jump off a bridge in a traffic. He has had decreased appetite and insomnia. He states he has been drinking alcohol. "Today the patient was interviewed. The patient has a history of alcoholism and has been abusing alcohol for 30 years. He was [facility] to our [facility] ([facility] abuse residential program) and was able to sustain sobriety for 6 months. This was the first time in his life that he ha he had been sober for a significant period of time.  However in the first week of June he relapsed on alcohol and was charged with a DWI, therefore he was asked to leave the facility. The patient was incarcerated for a week and lost his job at [employer] (job that he had found through [facility]). Since then the patient has been staying with a friend. He states he has been drinking heavily about a fifth of  liquor and 240 ounces every day. He is started having severe depression and suicidal ideation after he found out that he could not pay his bills because he had lost his job. The friend that bailed him out from jail became upset with him because he did not have the money to pay him back . His friend placed ace all the patient's belongings into storage. Patient states he's been having suicidal thoughts everyday since all of these happen. The patient does have believe that if he were to commit suicide he will go to hell and did not have a plan on how to harm himself without causing pain. Today patient continues to voice suicidal ideation.  Patient denies HI, auditory or visual hallucinations. Patient denies any symptoms consistent with bipolar disorder mania or hypomania.  Substance abuse history: Patient states he has been abusing alcohol for at least 30 years. He was admitted at [facility] (long-term substance abuse inpatientpatient program in [place]). Patient was able to remain sober for 6 months. After that he relapsed and was asked to leave. Patient reports history of 3 DWIs in his life. The last one to place during this last relapse. Patient drinks about one pack of cigarettes per day. Denies history of abusing any other substances or prescription medications.  Elements: Severity: Severe. Timing: Chronic with acute exacerbation. Duration: Over the last week. Context: Relapse. Associated Signs/Symptoms: Depression Symptoms: depressed mood, psychomotor agitation, recurrent thoughts of death,  Total Time spent with patient: 1 hour   Past psychiatric history: Patient denies any psychiatric treatment in the past for anything other than substance abuse. Never been on psychiatric medicine. No history of suicide attempts or psychiatric hospitalization. On history of alcohol abuse. Has been able to maintain sobriety for up to 6 weeks in the past.  Past Medical History: Patient  has never been treated for any chronic medical condition.  Past Medical History  Diagnosis Date  . Hypertension    History reviewed. No pertinent past surgical history.  Family History: Reports having some uncles suffer from alcoholism. There is no family history of mental illness or suicides in his family Family History  Problem Relation Age of Onset  . Family history unknown: Yes   Social History: He had recently been staying at our [facility] and estimates that he had been there for 6 months. He relapsed and so he is not able to go back there at this time. Patient evidently still has legal charges and problems in front of him although he's out of jail. He does have extended family as well as children of his own but has not been in touch with him because of his embarrassment about his situation. Patient is currently unemployed and homeless. History  Alcohol Use  . Yes    Comment: 80 oz beer daily, 1/2 pint liquor daily    History  Drug Use No    History   Social History  . Marital Status: Divorced    Spouse Name: N/A  . Number of Children: N/A  . Years of Education: N/A   Social History Main Topics  .  Smoking status: Current Every Day Smoker -- 0.50 packs/day for 35 years  . Smokeless tobacco: Never Used  . Alcohol Use: Yes     Comment: 80 oz beer daily, 1/2 pint liquor daily  . Drug Use: No  . Sexual Activity: Not Currently    Birth Control/ Protection: None   Other Topics Concern  . None   Social History Narrative   Additional Social History:   Pain Medications: none Prescriptions: none Over the Counter: none History of alcohol / drug use?: Yes Longest period of sobriety (when/how long): 6 months Negative Consequences of Use: Financial, Scientist, research (physical sciences), Personal relationships, Work / School Withdrawal Symptoms: Tremors, Change in blood pressure, Nausea / Vomiting Name of Substance 1: alcohol 1 - Age of  First Use: 18 1 - Amount (size/oz): 2-40 oz daily, 1/2 pint liquor daily 1 - Frequency: daily 1 - Duration: started drinking age 50 1 - Last Use / Amount: 6/8/21016       Hospital Course:   Depressive symptoms: Patient was started on citalopram. Dose was increased to 20 mg by mouth daily. He reported significant improvement with the citalopram. He denied any side effects from this agent.  Alcohol withdrawal: Minimal alcohol withdrawal noted at the time of admission. The patient was on Librium for a little over 24 hours.  Insomnia: For insomnia and this patient was started on trazodone,  the dose was titrated up to 150 mg by mouth daily at bedtime  Hypertension: During this admission the patient was found to have hypertension he was restarted on Norvasc 2.5 mg by mouth daily.  Tobacco use disorder patient will recall for Nicotrol inhaler when necessary.  Constipation: Patient received prune juice with all meals and miralax prn.  Patient was interviewed by a residential faith based program however patient was not accepted to that program. Social worker is not aware as to reason why the patient was not accepted. Social worker  referred patient to [facility].  Patient participated in the phone intervene and was accepted to the program. One day prior to his admission to [facility] he requested to be discharged to his family. He explained that he had spoken with his children who wanted him to return to the home with them.  He feels that the family is supportive and with their support and help he will be able to stay sober. The patient was planning on attending AA daily and getting a sponsor.   On the day of the discharge the patient denied SI, HI or auditory or visual hallucinations. There was no evidence of psychosis. He was euthymic, hopeful and future oriented. Denied side effects from medications. Denies having any physical complaints. The patient participated actively in programming during his stay  in the unit. At all times he cooperated with staff.. There was no reported behavioral problems. There was no need for seclusion, restraints or forced medications.   Consults:  None  Significant Diagnostic Studies:  None  Discharge Vitals:   Blood pressure 147/85, pulse 63, temperature 98 F (36.7 C), temperature source Oral, resp. rate 20, height '5\' 10"'  (1.778 m), weight 73.936 kg (163 lb), SpO2 97 %. Body mass index is 23.39 kg/(m^2).  Lab Results:    Results for Matthew [name], Matthew [name] (MRN [id number]) as of 10/30/2014 10:20  Ref. Range 10/23/2014 11:28 10/24/2014 06:43 10/25/2014 06:42 10/28/2014 10:33  Sodium Latest Ref Range: 135-145 mmol/L 136     Potassium Latest Ref Range: 3.5-5.1 mmol/L 4.0     Chloride Latest Ref Range: 101-111  mmol/L 97 (L)     CO2 Latest Ref Range: 22-32 mmol/L 23     BUN Latest Ref Range: 6-20 mg/dL 9     Creatinine Latest Ref Range: 0.61-1.24 mg/dL 0.74     Calcium Latest Ref Range: 8.9-10.3 mg/dL 9.1     EGFR (Non-African Amer.) Latest Ref Range: >60 mL/min >60     EGFR (African American) Latest Ref Range: >60 mL/min >60     Glucose Latest Ref Range: 65-99 mg/dL 72     Anion gap Latest Ref Range: 5-15  16 (H)     Alkaline Phosphatase Latest Ref Range: 38-126 U/L 122 118    Albumin Latest Ref Range: 3.5-5.0 g/dL 4.4 4.2    AST Latest Ref Range: 15-41 U/L 145 (H) 115 (H)    ALT Latest Ref Range: 17-63 U/L 75 (H) 66 (H)    Total Protein Latest Ref Range: 6.5-8.1 g/dL 7.6 7.4    Bilirubin, Direct Latest Ref Range: 0.1-0.5 mg/dL  0.5    Indirect Bilirubin Latest Ref Range: 0.3-0.9 mg/dL  2.5 (H)    Total Bilirubin Latest Ref Range: 0.3-1.2 mg/dL 2.6 (H) 3.0 (H)    WBC Latest Ref Range: 3.8-10.6 K/uL 6.8     RBC Latest Ref Range: 4.40-5.90 MIL/uL 4.27 (L)     Hemoglobin Latest Ref Range: 13.0-18.0 g/dL 13.8     HCT Latest Ref Range: 40.0-52.0 % 40.0     MCV Latest Ref Range: 80.0-100.0 fL 93.6     MCH Latest Ref Range: 26.0-34.0 pg 32.3     MCHC Latest Ref Range:  32.0-36.0 g/dL 34.5     RDW Latest Ref Range: 11.5-14.5 % 14.0     Platelets Latest Ref Range: 150-440 K/uL 423 (L)     Salicylate Lvl Latest Ref Range: 2.8-30.0 mg/dL <4.0     Acetaminophen (Tylenol), S Latest Ref Range: 10-30 ug/mL <10 (L)     TSH Latest Ref Range: 0.350-4.500 uIU/mL   1.603   Appearance Latest Ref Range: CLEAR  CLEAR (A)     Bacteria, UA Latest Ref Range: NONE SEEN  NONE SEEN     Bilirubin Urine Latest Ref Range: NEGATIVE  NEGATIVE     Color, Urine Latest Ref Range: YELLOW  YELLOW (A)     Glucose Latest Ref Range: NEGATIVE mg/dL NEGATIVE     Hgb urine dipstick Latest Ref Range: NEGATIVE  2+ (A)     Ketones, ur Latest Ref Range: NEGATIVE mg/dL 1+ (A)     Leukocytes, UA Latest Ref Range: NEGATIVE  NEGATIVE     Mucous Unknown PRESENT     Nitrite Latest Ref Range: NEGATIVE  NEGATIVE     pH Latest Ref Range: 5.0-8.0  6.0     Protein Latest Ref Range: NEGATIVE mg/dL NEGATIVE     RBC / HPF Latest Ref Range: 0-5 RBC/hpf 0-5     Specific Gravity, Urine Latest Ref Range: 1.005-1.030  1.009     Squamous Epithelial / LPF Latest Ref Range: NONE SEEN  0-5 (A)     WBC, UA Latest Ref Range: 0-5 WBC/hpf 0-5     Alcohol, Ethyl (B) Latest Ref Range: <5 mg/dL 63 (H)     Amphetamines, Ur Screen Latest Ref Range: NONE DETECTED  NONE DETECTED     Barbiturates, Ur Screen Latest Ref Range: NONE DETECTED  NONE DETECTED     Benzodiazepine, Ur Scrn Latest Ref Range: NONE DETECTED  NONE DETECTED     Cocaine Metabolite,Ur [place] Latest Ref Range:  NONE DETECTED  NONE DETECTED     Methadone Scn, Ur Latest Ref Range: NONE DETECTED  NONE DETECTED     MDMA (Ecstasy)Ur Screen Latest Ref Range: NONE DETECTED  NONE DETECTED     Cannabinoid 50 Ng, Ur [place] Latest Ref Range: NONE DETECTED  NONE DETECTED     Opiate, Ur Screen Latest Ref Range: NONE DETECTED  NONE DETECTED     Phencyclidine (PCP) Ur S Latest Ref Range: NONE DETECTED  NONE DETECTED     Tricyclic, Ur Screen Latest Ref Range: NONE DETECTED  NONE  DETECTED       Physical Findings: AIMS:  , ,  ,  ,    CIWA:  CIWA-Ar Total: 5 COWS:      See Psychiatric Specialty Exam and Suicide Risk Assessment completed by Attending Physician prior to discharge.  Discharge destination:  Home  Is patient on multiple antipsychotic therapies at discharge:  No   Has Patient had three or more failed trials of antipsychotic monotherapy by history:  No    Recommended Plan for Multiple Antipsychotic Therapies: NA      Discharge Instructions    Diet - low sodium heart healthy    Complete by:  As directed      Diet - low sodium heart healthy    Complete by:  As directed             Medication List    TAKE these medications      Indication   amLODipine 2.5 MG tablet  Commonly known as:  NORVASC  Take 1 tablet (2.5 mg total) by mouth daily.  Notes to Patient:  HTN      citalopram 20 MG tablet  Commonly known as:  CELEXA  Take 1 tablet (20 mg total) by mouth at bedtime.  Notes to Patient:  depression      hydrocortisone cream 0.5 %  Apply 1 application topically 2 (two) times daily. On affected area  Notes to Patient:  Eczema       traZODone 150 MG tablet  Commonly known as:  DESYREL  Take 1 tablet (150 mg total) by mouth at bedtime.  Notes to Patient:  insomnia          Follow-up recommendations:  Other:  Patient to be admitted at [facility] on Monday, June 20  Comments:  None   Total Discharge Time: 30 minutes  Signed: Hildred Priest 11/03/2014, 1:47 PM

## 2014-10-30 NOTE — [facility] Group Notes (Signed)
[facility] LCSW Aftercare Discharge Planning Group Note  10/30/2014 7:58 AM  Participation Quality:  Attentive  Affect:  Appropriate  Cognitive:  Appropriate  Insight:  Developing/Improving  Engagement in Group:  Developing/Improving  Modes of Intervention:  Discussion, Education and Support  Summary of Progress/Problems: Patients smart goal for today is to contact his kids and come to groups  Calisha Tindel M 10/30/2014, 7:58 AM

## 2014-10-30 NOTE — [facility] Group Notes (Signed)
[facility] Group Notes:  (Nursing/MHT/Case Management/Adjunct)  Date:  10/30/2014  Time:  2:00 PM  Type of Therapy:  Movement Therapy  Participation Level:  Active  Participation Quality:  Appropriate  Affect:  Appropriate  Cognitive:  Alert, Appropriate and Oriented  Insight:  Appropriate  Engagement in Group:  Engaged  Modes of Intervention:  Activity  Summary of Progress/Problems:  Nayelie Gionfriddo De'Chelle Talor [place] 10/30/2014, 2:00 PM

## 2014-10-30 NOTE — [facility] Group Notes (Signed)
[facility] Group Notes:  (Nursing/MHT/Case Management/Adjunct)  Date:  10/30/2014  Time:  8:58 AM  Type of Therapy:  goal   Participation Level:  Active  Participation Quality:  Sharing and Supportive  Affect:  Appropriate  Cognitive:  Appropriate  Insight:  Appropriate  Engagement in Group:  Limited  Modes of Intervention:  goal setting   Summary of Progress/Problems:  Matthew [name] 10/30/2014, 8:58 AM

## 2014-10-30 NOTE — Progress Notes (Signed)
Recreation Therapy Notes  Date: 06.16.16 Time: 3:05 pm Location: Craft Room  Group Topic: Coping Skills/Leisure Education  Goal Area(s) Addresses:  Patient will identify things they are grateful for. Patient will identify how being grateful can influence your decision making.  Behavioral Response: Attentive, Interactive  Intervention: Grateful Wheel  Activity: Patients were given an "I Am Grateful For" worksheet and instructed to list at least one thing they were grateful for under each category.   Education: LRT educated patient on leisure and why it is important to implement it into their schedules.  Education Outcome: Acknowledges education/In group clarification offered  Clinical Observations/Feedback: Patient participated in group activity. Patient contributed to group discussion by stating things he was grateful for.  Jacquelynn Cree, LRT/CTRS 10/30/2014 4:50 PM

## 2014-10-30 NOTE — [facility] Group Notes (Signed)
[facility] LCSW Group Therapy  10/30/2014 7:55 AM  Type of Therapy:  Group Therapy  Participation Level:  Active  Participation Quality:  Appropriate  Affect:  Appropriate  Cognitive:  Appropriate  Insight:  Developing/Improving  Engagement in Therapy:  Developing/Improving  Modes of Intervention:  Discussion, Education, Exploration and Support  Summary of Progress/Problems:LCSW introduced group rules today's topic was Balance in life and relapse prevention. Patients were asked to reflect on challenges they have once in the community and barriers to their success. Patients were then asked in a supportive collaboration to come up with ideas that could support overcoming their barriers and challenges. Patient identified loss of a solid relationship as a big barrier to his wellness. He decided the best thing for himself is to learn from old relationship and move forward  Johnella Moloney, Anmol Fleck M 10/30/2014, 7:55 AM

## 2014-10-30 NOTE — Progress Notes (Signed)
Patient alert and oriented. Patient medication, group, and meal compliance. Patient endorses depression. Passive SI, no plan, but contract for safety. No HI,Auditory or visual Hallucinations. Patient has no behaviors.

## 2014-10-30 NOTE — Progress Notes (Signed)
D: Patient denies SI/HI/AVH. Patient affect is sad. Mood is depressed.  Patient did attend evening group. Patient visible on the milieu. No distress noted. A: Support and encouragement offered. Scheduled medications given to pt. Q 15 min checks continued for patient safety. R: Patient receptive. Patient remains safe on the unit.   

## 2014-10-31 NOTE — Progress Notes (Signed)
[facility] MD Progress Note  10/31/2014 12:16 PM Matthew [name]  MRN:  [id number] Subjective:  Patient reports feeling better. Still he has not contacted anybody from his family to let them know where he is. Patient realizes that they might be very worried about his safety as they knew he was very depressed prior to coming into the hospital.  Since admission a shunt has not made any anybody aware that he was hospitalized he feels guilty and chain for about relapsing and alcohol. He is still believes he has let everybody down.  Patient has been debating whether to call his children today or not.  He is hopeful about his admission to [facility]. Describes his mood as depressed but has not asked severely down as when he came in. He continues to have passive suicidal ideation.  His sleep, energy, appetite and concentration are improving.  Denies auditory or visual hallucinations. Denies homicidal ideation. Denies side effects from medications. Patient denies any physical complaints today. Patient states that he has been attending groups.   Nursing staff reports patient slept 6.7 hours.  Principal Problem: Major depressive disorder, single episode, severe without psychotic features Diagnosis:   Patient Active Problem List   Diagnosis Date Noted  . Eczema [L30.9] 10/30/2014  . Alcohol use disorder, severe, dependence [F10.20] 10/24/2014  . Tobacco use disorder [Z72.0] 10/24/2014  . HTN (hypertension), benign [I10] 10/24/2014  . Major depressive disorder, single episode, severe without psychotic features [F32.2] 10/24/2014  . Alcohol withdrawal [F10.239] 10/23/2014   Total Time spent with patient: 30 minutes   Past Medical History:  Past Medical History  Diagnosis Date  . Hypertension    History reviewed. No pertinent past surgical history. Family History:  Family History  Problem Relation Age of Onset  . Family history unknown: Yes   Social History:  History  Alcohol Use  . Yes    Comment: 80 oz  beer daily, 1/2 pint liquor daily     History  Drug Use No    History   Social History  . Marital Status: Divorced    Spouse Name: N/A  . Number of Children: N/A  . Years of Education: N/A   Social History Main Topics  . Smoking status: Current Every Day Smoker -- 0.50 packs/day for 35 years  . Smokeless tobacco: Never Used  . Alcohol Use: Yes     Comment: 80 oz beer daily, 1/2 pint liquor daily  . Drug Use: No  . Sexual Activity: Not Currently    Birth Control/ Protection: None   Other Topics Concern  . None   Social History Narrative   Additional History:    Sleep: Good  Appetite:  Fair   Assessment:   Musculoskeletal: Strength & Muscle Tone: within normal limits Gait & Station: normal Patient leans: N/A   Psychiatric Specialty Exam: Physical Exam   Review of Systems  Constitutional: Negative.   HENT: Negative.   Eyes: Negative.   Respiratory: Negative.   Cardiovascular: Negative.   Gastrointestinal: Negative for constipation.  Genitourinary: Negative.   Musculoskeletal: Negative.   Skin: Negative.   Endo/Heme/Allergies: Negative.   Psychiatric/Behavioral: Positive for depression and suicidal ideas. The patient has insomnia.     Blood pressure 131/83, pulse 67, temperature 98.4 F (36.9 C), temperature source Oral, resp. rate 20, height  (1.778 m), weight 73.936 kg (163 lb), SpO2 97 %.Body mass index is 23.39 kg/(m^2).  General Appearance: Disheveled  Eye Contact::  Fair  Speech:  Normal Rate  Volume:  Normal  Mood:  Dysphoric  Affect:  Appropriate and Congruent  Thought Process:  Linear  Orientation:  Full (Time, Place, and Person)  Thought Content:  Hallucinations: None  Suicidal Thoughts:  Yes.  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 acetaminophen (TYLENOL) tablet 650 mg  650 mg Oral Q6H PRN Audery Amel, MD      . alum & mag hydroxide-simeth (MAALOX/MYLANTA) 200-200-20 MG/5ML suspension 30 mL  30 mL Oral Q4H PRN Audery Amel, MD      . amLODipine (NORVASC) tablet 2.5 mg  2.5 mg Oral Daily Jimmy Footman, MD   2.5 mg at 10/31/14 0807  . citalopram (CELEXA) tablet 20 mg  20 mg Oral QHS Jimmy Footman, MD   20 mg at 10/30/14 2126  . hydrocortisone cream 0.5 %   Topical TID Jimmy Footman, MD      . magnesium hydroxide (MILK OF MAGNESIA) suspension 30 mL  30 mL Oral Daily PRN Audery Amel, MD      . nicotine (NICOTROL) 10 MG inhaler 1 continuous puffing  1 continuous puffing Inhalation PRN Brandy Hale, MD   1 continuous puffing at 10/23/14 2207  . traZODone (DESYREL) tablet 150 mg  150 mg Oral QHS Jimmy Footman, MD   150 mg at 10/30/14 2126    Lab Results:  No results found for this or any previous visit (from the past 48 hour(s)).  Physical Findings: AIMS:  , ,  ,  ,    CIWA:  CIWA-Ar Total: 5 COWS:     Treatment Plan Summary: Daily contact with patient to assess and evaluate symptoms and progress in treatment and Medication management   Depressive symptoms: Continue citalopram  20 mg by mouth daily  Alcohol withdrawal: No evidence of alcohol withdrawal at this time will discontinue CIWA and q 8 vitals.  Librium will be discontinued .  Insomnia: Continue trazodone 150 mg by mouth daily at bedtime  Hypertension: Patient has been started on Norvasc 2.5 mg by mouth daily. Continue low sodium diet.  Blood pressure has been well  controlled with Norvasc  Tobacco use disorder patient will recall for Nicotrol inhaler when necessary.  Constipation: will start prune juice with all meals and will add miralax prn.  Eczema: continue hydrocortisone 0.5 % tid.  Discharge planning once a stable this patient will be discharged to follow-up with substance abuse program. Social worker had arranged for the patient to be interviewed Monday by a local faith based residential program. They contacted the social worker yesterday to say that the patient was not going to be accepted, the reasons for the denial are unknown at this time. Social worker made a referral for another residential treatment, [facility].  Patient has been accepted to their program however they will not have an opening until June 21. Per their arrangement with [facility] patients are not allowed to be admitted from outside the hospital  and therefore the patient will have to remain in our facility until the 21st.    Chest Xray wnl no evidence of TB   TSH within the normal limits   Medical Decision Making:  Established Problem, Stable/Improving (1)     Jimmy Footman 10/31/2014, 12:16 PM

## 2014-10-31 NOTE — Progress Notes (Signed)
D: Patient denies SI/HI/AVH. Patient affect is sad and his mood is depressed.  Patient did attend evening group. Patient visible on the milieu. No distress noted. A: Support and encouragement offered. Scheduled medications given to pt. Q 15 min checks continued for patient safety. R: Patient receptive. Patient remains safe on the unit.

## 2014-10-31 NOTE — [facility] Group Notes (Signed)
[facility] LCSW Aftercare Discharge Planning Group Note  10/31/2014 11:00 AM  Participation Quality:  Did not attend  Affect:    Cognitive:    Insight:    Engagement in Group:    Modes of Intervention:    Summary of Progress/Problems:  Cheron Schaumann 10/31/2014, 11:00 AM

## 2014-10-31 NOTE — Plan of Care (Signed)
Problem: Ineffective individual coping Goal: STG-Increase in ability to manage activities of daily living Outcome: Progressing Pt out of room more, brighter affect

## 2014-10-31 NOTE — Tx Team (Signed)
Interdisciplinary Treatment Plan Update (Adult)  Date:  10/31/2014 Time Reviewed:  11:44 AM  Progress in Treatment: Attending groups: Yes. Participating in groups:  Yes. Taking medication as prescribed:  Yes. Tolerating medication:  Yes. Family/Significant othe contact made:  No, will contact:  Pt has not consented to any family contact Patient understands diagnosis:  Yes. Discussing patient identified problems/goals with staff:  Yes. Medical problems stabilized or resolved:  Yes. Denies suicidal/homicidal ideation: No. Issues/concerns per patient self-inventory:  Yes. Other:  New problem(s) identified: No, Describe:     Discharge Plan or Barriers:Pt will be discharged to [facility] in [place] on 11/04/2014.  They are unable to accept him prior to this time and cannot accept him from the community.  Discharge before bed is available would likely put Pt at increased risk for self-harm due to his current condition.  He will receive services from [facility] in [place].  Reason for Continuation of Hospitalization: Depression Safe After-care plan Comments:  Estimated length of stay: up to 4 days  New goal(s):  Review of initial/current patient goals per problem list:   SEE PLAN OF CARE  Attendees: Patient:  Matthew [name] 6/17/201611:44 AM  Family:   6/17/201611:44 AM  Physician:  Radene Journey, MD  6/17/201611:44 AM  Nursing:    6/17/201611:44 AM  Case Manager:   6/17/201611:44 AM  Counselor:   6/17/201611:44 AM  Other:  Jake Shark, LCSW 6/17/201611:44 AM  Other:  Arrie Senate, LCSWA 6/17/201611:44 AM  Other:  Hershal Coria, LRT 6/17/201611:44 AM  Other:  6/17/201611:44 AM  Other:  6/17/201611:44 AM  Other:  6/17/201611:44 AM  Other:  6/17/201611:44 AM  Other:  6/17/201611:44 AM  Other:  6/17/201611:44 AM  Other:   6/17/201611:44 AM   Scribe for Treatment Team:   Jake Shark P,LCSW 10/31/2014, 11:44 AM

## 2014-10-31 NOTE — [facility] Group Notes (Signed)
[facility] LCSW Group Therapy  10/31/2014 4:53 PM  Type of Therapy:  Group Therapy  Participation Level:  Active  Participation Quality:  Appropriate  Affect:  Appropriate  Cognitive:  Appropriate  Insight:  Engaged  Engagement in Therapy:  Developing/Improving  Modes of Intervention:  Discussion, Education, Exploration and Support  Summary of Progress/Problems: LCSW reviewed group rules with each patient. It was a calm small group and we mad our focus on things we can do to promote our wellness, happiness and relationship obstacles. Patients were asked to share and reflect on personal dating experiences and then support one another with positive affirmations and funny life quotes.  Matthew [name] M 10/31/2014, 4:53 PM

## 2014-10-31 NOTE — Plan of Care (Signed)
Problem: [facility] Participation in Recreation Therapeutic Interventions Goal: STG-Patient will demonstrate improved self esteem by identif STG: Self-Esteem - Within 4 treatment sessions, patient will verbalize at least 5 positive affirmation statements in each of 3 treatment sessions to increase self-esteem post d/c.  Outcome: Completed/Met Date Met:  10/31/14 Treatment Session 3; Completed 3 out of 3: At approximately 12:30 pm, LRT met with patient in patient room. Patient verbalized 5 positive affirmation statements. Patient reported it felt "good" and he was "feeling better". LRT encouraged patient to continue saying positive affirmation statements.  Leonette Monarch, LRT/CTRS 06.17.16 1:18 pm

## 2014-10-31 NOTE — [facility] Group Notes (Signed)
[facility] Group Notes:  (Nursing/MHT/Case Management/Adjunct)  Date:  10/31/2014  Time:  11:53 AM  Type of Therapy:  Group Therapy  Participation Level:  Active  Participation Quality:  Appropriate, Attentive and Sharing  Affect:  Appropriate  Cognitive:  Appropriate and Confused  Insight:  Improving  Engagement in Group:  Engaged and Off Topic  Modes of Intervention:  Discussion and Education  Summary of Progress/Problems:  Mickey Farber 10/31/2014, 11:53 AM

## 2014-10-31 NOTE — [facility] Group Notes (Signed)
[facility] Group Notes:  (Nursing/MHT/Case Management/Adjunct)  Date:  10/31/2014  Time:  1:23 AM  Type of Therapy:  Group Therapy  Participation Level:  Active  Participation Quality:  Appropriate  Affect:  Appropriate  Cognitive:  Appropriate  Insight:  Good  Engagement in Group:  Engaged  Modes of Intervention:  n/a  Summary of Progress/Problems:  Veva Holes 10/31/2014, 1:23 AM

## 2014-10-31 NOTE — Progress Notes (Signed)
D: Patient denies SI/HI/AVH. Patient affect is sad but brighter. Mood is depressed. Patient did attend group. Patient visible on the milieu. No distress noted. A: Support and encouragement offered. Scheduled medications given to pt. Q 15 min checks continued for patient safety. R: Patient receptive. Patient remains safe on the unit.

## 2014-10-31 NOTE — Progress Notes (Signed)
Recreation Therapy Notes  Date: 06.17.16 Time: 3:00 pm Location: Craft Room  Group Topic: Self-expression/coping skills  Goal Area(s) Addresses:  Patient will effectively use art as a means of self-expression. Patient recognize positive benefit for self-expression. Patient will be able to identify one emotion experienced during group session. Patient will identify use of art/self-expression as a coping skill.  Behavioral Response: Attentive, Interactive  Intervention: Two Faces of Me  Activity: Patients were given a blank face worksheet and instructed to draw or write how they felt when they were admitted to the hospital on one said and draw or write how they want to feel when they are d/c on the other side.  Education: LRT educated patients on different forms of self-expression.   Education Outcome: Acknowledges education/In group clarification offered  Clinical Observations/Feedback: Patient completed activity by writing how he felt when he was admitted and how he wants to feel when he is d/c. Patient did not contribute to group discussion.  Jacquelynn Cree, LRT/CTRS 10/31/2014 4:14 PM

## 2014-10-31 NOTE — [facility] Group Notes (Signed)
Adult Psychoeducational Group Note  Date:  10/31/2014 Time:  11:03 PM  Group Topic/Focus:  Wrap-Up Group:   The focus of this group is to help patients review their daily goal of treatment and discuss progress on daily workbooks.  Participation Level:  Active  Participation Quality:  Appropriate  Affect:  Appropriate  Cognitive:  Alert  Insight: Good  Engagement in Group:  Engaged  Modes of Intervention:  Discussion  Additional Comments:  N/A  Tomasita Morrow 10/31/2014, 11:03 PM

## 2014-11-01 MED ORDER — HYDROXYZINE HCL 50 MG PO TABS
50.0000 mg | ORAL_TABLET | Freq: Every day | ORAL | Status: DC
  Administered 2014-11-01 – 2014-11-02 (×2): 50 mg via ORAL
  Filled 2014-11-01 (×2): qty 1

## 2014-11-01 NOTE — Plan of Care (Signed)
Problem: Ineffective individual coping Goal: STG: Patient will remain free from self harm Outcome: Progressing No self harm noted. Denies SI Goal: STG: Patient will participate in after care plan Outcome: Progressing Pt wanting to talk with family in reference to discharge

## 2014-11-01 NOTE — Progress Notes (Signed)
D: Pt denies SI/HI/AVH. Pt is pleasant and cooperative. Pt stated he feels better denies  SI/HI, he appears less anxious and he is interacting with peers and staff appropriately.  A: Pt was offered support and encouragement. Pt was given scheduled medications. Pt was encouraged to attend groups. Q 15 minute checks were done for safety.  R:Pt attends groups and interacts well with peers and staff. Pt is taking medication. Pt has no complaints.Pt receptive to treatment and safety maintained on unit.

## 2014-11-01 NOTE — Plan of Care (Signed)
Problem: Alteration in mood & ability to function due to Goal: LTG-Pt reports reduction in suicidal thoughts (Patient reports reduction in suicidal thoughts and is able to verbalize a safety plan for whenever patient is feeling suicidal)  Outcome: Progressing Denies SI/HI.

## 2014-11-01 NOTE — Progress Notes (Signed)
[facility] MD Progress Note  11/01/2014 1:27 PM Matthew [name]  MRN:  [id number] Subjective:  Follow-up for this 54 year old man with major depression and with alcohol abuse. He reports that his mood is better. Denies suicidal ideation. He is still a little bit depressed. He is thinking of trying to call his family today to let them know where he is. It sounds like he's been too embarrassed and depressed to get in touch with them up until today. Patient reports that his sleep is still poor with frequent wakening at night. He denies suicidal ideation and denies psychotic symptoms. He is looking forward to placement at a substance abuse facility Principal Problem: Major depressive disorder, single episode, severe without psychotic features Diagnosis:   Patient Active Problem List   Diagnosis Date Noted  . Eczema [L30.9] 10/30/2014  . Alcohol use disorder, severe, dependence [F10.20] 10/24/2014  . Tobacco use disorder [Z72.0] 10/24/2014  . HTN (hypertension), benign [I10] 10/24/2014  . Major depressive disorder, single episode, severe without psychotic features [F32.2] 10/24/2014  . Alcohol withdrawal [F10.239] 10/23/2014   Total Time spent with patient: 30 minutes   Past Medical History:  Past Medical History  Diagnosis Date  . Hypertension    History reviewed. No pertinent past surgical history. Family History:  Family History  Problem Relation Age of Onset  . Family history unknown: Yes   Social History:  History  Alcohol Use  . Yes    Comment: 80 oz beer daily, 1/2 pint liquor daily     History  Drug Use No    History   Social History  . Marital Status: Divorced    Spouse Name: N/A  . Number of Children: N/A  . Years of Education: N/A   Social History Main Topics  . Smoking status: Current Every Day Smoker -- 0.50 packs/day for 35 years  . Smokeless tobacco: Never Used  . Alcohol Use: Yes     Comment: 80 oz beer daily, 1/2 pint liquor daily  . Drug Use: No  . Sexual  Activity: Not Currently    Birth Control/ Protection: None   Other Topics Concern  . None   Social History Narrative   Additional History:    Sleep: Poor  Appetite:  Good   Assessment:   Musculoskeletal: Strength & Muscle Tone: within normal limits Gait & Station: normal Patient leans: N/A   Psychiatric Specialty Exam: Physical Exam  Constitutional: He appears well-developed and well-nourished.  HENT:  Head: Normocephalic and atraumatic.  Eyes: Conjunctivae are normal. Pupils are equal, round, and reactive to light.  Neck: Normal range of motion.  Cardiovascular: Normal heart sounds.   Respiratory: Effort normal.  GI: Soft.  Musculoskeletal: Normal range of motion.  Neurological: He is alert.  Skin: Skin is warm and dry.  Psychiatric: Judgment and thought content normal. His affect is blunt. His affect is not angry. His speech is delayed. His speech is not rapid and/or pressured. He is withdrawn. He is not hyperactive, not actively hallucinating and not combative. Cognition and memory are normal.  Patient continues to stay in his room a lot. He is easy to arouse however. He is not psychotic and shows improved insight.    Review of Systems  Constitutional: Negative.   HENT: Negative.   Eyes: Negative.   Respiratory: Negative.   Cardiovascular: Negative.   Gastrointestinal: Negative.   Musculoskeletal: Negative.   Skin: Negative.   Neurological: Negative.   Psychiatric/Behavioral: Positive for substance abuse. Negative for depression, suicidal ideas,  hallucinations and memory loss. The patient is nervous/anxious and has insomnia.     Blood pressure 138/84, pulse 59, temperature 98 F (36.7 C), temperature source Oral, resp. rate 20, height  (1.778 m), weight 73.936 kg (163 lb), SpO2 97 %.Body mass index is 23.39 kg/(m^2).  General Appearance: Casual  Eye Contact::  Good  Speech:  Normal Rate  Volume:  Normal  Mood:  Dysphoric  Affect:  Congruent  Thought  Process:  Logical  Orientation:  Full (Time, Place, and Person)  Thought Content:  Negative  Suicidal Thoughts:  No  Homicidal Thoughts:  No  Memory:  Immediate;   Good Recent;   Good Remote;   Good  Judgement:  Fair  Insight:  Fair  Psychomotor Activity:  Decreased  Concentration:  Good  Recall:  Good  Fund of Knowledge:Good  Language: Good  Akathisia:  No  Handed:  Right  AIMS (if indicated):     Assets:  Communication Skills Desire for Improvement Physical Health Resilience  ADL's:  Intact  Cognition: WNL  Sleep:  Number of Hours: 6.5     Current Medications: Current Facility-Administered Medications  Medication Dose Route Frequency Provider Last Rate Last Dose  . acetaminophen (TYLENOL) tablet 650 mg  650 mg Oral Q6H PRN Audery Amel, MD      . alum & mag hydroxide-simeth (MAALOX/MYLANTA) 200-200-20 MG/5ML suspension 30 mL  30 mL Oral Q4H PRN Audery Amel, MD      . amLODipine (NORVASC) tablet 2.5 mg  2.5 mg Oral Daily Jimmy Footman, MD   2.5 mg at 11/01/14 1610  . citalopram (CELEXA) tablet 20 mg  20 mg Oral QHS Jimmy Footman, MD   20 mg at 10/31/14 2144  . hydrocortisone cream 0.5 %   Topical TID Jimmy Footman, MD      . hydrOXYzine (ATARAX/VISTARIL) tablet 50 mg  50 mg Oral QHS Audery Amel, MD      . magnesium hydroxide (MILK OF MAGNESIA) suspension 30 mL  30 mL Oral Daily PRN Audery Amel, MD      . nicotine (NICOTROL) 10 MG inhaler 1 continuous puffing  1 continuous puffing Inhalation PRN Brandy Hale, MD   1 continuous puffing at 10/23/14 2207  . traZODone (DESYREL) tablet 150 mg  150 mg Oral QHS Jimmy Footman, MD   150 mg at 10/31/14 2144    Lab Results: No results found for this or any previous visit (from the past 48 hour(s)).  Physical Findings: AIMS:  , ,  ,  ,    CIWA:  CIWA-Ar Total: 5 COWS:     Treatment Plan Summary: Medication management, Plan Patient will continue on current medication  including citalopram. He is taking trazodone at night for sleep. Blood pressure is doing well and doesn't require any change to blood pressure medicine. Patient continue the Nicotrol for tobacco use. Continue awaiting plan for going to [facility] scope. At his request to try and get better sleep I have added Vistaril 50 mg at night. I was hesitant to increase his trazodone dose for risk of side effects. Continue to follow up tomorrow. and     Medical Decision Making:  Established Problem, Stable/Improving (1), Review of Psycho-Social Stressors (1), Review of Medication Regimen & Side Effects (2) and Review of New Medication or Change in Dosage (2)     Marylene Masek 11/01/2014, 1:27 PM

## 2014-11-01 NOTE — [facility] Group Notes (Signed)
[facility] LCSW Group Therapy  11/01/2014 2:22 PM  Type of Therapy:  Group Therapy  Participation Level:  Active  Participation Quality:  Attentive  Affect:  Depressed  Cognitive:  Appropriate  Insight:  Engaged  Engagement in Therapy:  Improving  Modes of Intervention:  Discussion, Education, Exploration and Support  Summary of Progress/Problems:LCSW introduced group rules and held group outdoors today. We focused on understanding our own communication styles and peers were encouraged to support each other on new ways to communicate. Examples used and discussed was body language, verbal exchanges,knowing when to speak up and out and know when not to interrupt. This patient was able to reflect he is a little more depressed today. He reported he was bothered because he has not contacted his family since he was here. Patient was supported by LCSW and peers.   Johnella Moloney, Lashaundra Lehrmann M 11/01/2014, 2:22 PM

## 2014-11-01 NOTE — Progress Notes (Signed)
Pt pleasant and cooperative with care. No negative behaviors noted. Rates depression @7 . Denies s/s withdrawal. Med and group compliant. Appropriate with staff and peers. Pt goal is to contact family to get ready for discharge. Pt denies SI, HI. Will continue to assess and monitor for safety.

## 2014-11-02 NOTE — Plan of Care (Signed)
Problem: Ineffective individual coping Goal: LTG: Patient will report a decrease in negative feelings Outcome: Progressing Patient denies feeling depressed or anxious; denies anymore SI.

## 2014-11-02 NOTE — Plan of Care (Signed)
Problem: Ineffective individual coping Goal: STG: Patient will remain free from self harm Outcome: Not Progressing Patient continues to have SI, but denies having a plan, and contracts for safety.

## 2014-11-02 NOTE — [facility] Group Notes (Signed)
[facility] LCSW Group Therapy  11/02/2014 2:51 PM  Type of Therapy:  Group Therapy  Participation Level:  Minimal  Participation Quality:  Attentive  Affect:  Depressed  Cognitive:  Alert  Insight:  Improving  Engagement in Therapy:  Improving  Modes of Intervention:  Discussion, Education, Problem-solving, Socialization and Support  Summary of Progress/Problems:Balance in life: Patients will discuss the concept of balance and how it looks and feels to be unbalanced. Pt will identify areas in their life that is unbalanced and ways to become more balanced.    Rondall Allegra, MSW, LCSWA 11/02/2014, 2:51 PM

## 2014-11-02 NOTE — Progress Notes (Signed)
D) Patient pleasant and cooperative upon my assessment. Patient did or did not complete Self Inventory Assessment and rates depression as  0 /10, patient rates hopeless feelings as 0 /10.  Patient denies SI/HI, denies A/V hallucinations.  Patient's affect is blunted and mood is appropriate.  Reports that he has good appetite  A) Patient offered support and encouragement, patient encouraged to discuss feelings/concerns with staff. Patient verbalized understanding. Patient monitored Q15 minutes for safety. Patient met with MD  to discuss today's goals and plan of care.  R) Patient visible in milieu, has or has not attended groups today.  Patient come to dining room for meals and snacks. Patient appropriate with staff and peers.   Patient taking medications as ordered. Will continue to monitor.

## 2014-11-02 NOTE — Plan of Care (Signed)
Problem: Alteration in mood & ability to function due to Goal: LTG-Pt verbalizes understanding of importance of med regimen (Patient verbalizes understanding of importance of medication regimen and need to continue outpatient care and support groups)  Outcome: Progressing Patient voices understanding of taking his medications upon discharge.

## 2014-11-02 NOTE — [facility] Group Notes (Signed)
[facility] Group Notes:  (Nursing/MHT/Case Management/Adjunct)  Date:  11/02/2014  Time:  9:18 AM  Type of Therapy:  Goals   Participation Level:  Did Not Attend   Marquette Old 11/02/2014, 9:18 AM

## 2014-11-02 NOTE — Progress Notes (Signed)
[facility] MD Progress Note  11/02/2014 2:35 PM Matthew [name]  MRN:  [id number] Subjective:  Follow-up for this 54 year old man with major depression and with alcohol abuse. He reports that his mood is better. Denies suicidal ideation. He is still a little bit depressed. He is thinking of trying to call his family today to let them know where he is. It sounds like he's been too embarrassed and depressed to get in touch with them up until today. Patient reports that his sleep is still poor with frequent wakening at night. He denies suicidal ideation and denies psychotic symptoms. He is looking forward to placement at a substance abuse facility  No change in behavior or symptoms as of the 19th. Continues to be a little dysphoric but without active suicidal ideation or psychotic features. Insight remains reasonably good with plan to follow-up at outpatient substance abuse treatment. Blood pressure looks good. Other medical problems seem to be under reasonable control. No change to treatment plan. Principal Problem: Major depressive disorder, single episode, severe without psychotic features Diagnosis:   Patient Active Problem List   Diagnosis Date Noted  . Eczema [L30.9] 10/30/2014  . Alcohol use disorder, severe, dependence [F10.20] 10/24/2014  . Tobacco use disorder [Z72.0] 10/24/2014  . HTN (hypertension), benign [I10] 10/24/2014  . Major depressive disorder, single episode, severe without psychotic features [F32.2] 10/24/2014  . Alcohol withdrawal [F10.239] 10/23/2014   Total Time spent with patient: 30 minutes   Past Medical History:  Past Medical History  Diagnosis Date  . Hypertension    History reviewed. No pertinent past surgical history. Family History:  Family History  Problem Relation Age of Onset  . Family history unknown: Yes   Social History:  History  Alcohol Use  . Yes    Comment: 80 oz beer daily, 1/2 pint liquor daily     History  Drug Use No    History   Social  History  . Marital Status: Divorced    Spouse Name: N/A  . Number of Children: N/A  . Years of Education: N/A   Social History Main Topics  . Smoking status: Current Every Day Smoker -- 0.50 packs/day for 35 years  . Smokeless tobacco: Never Used  . Alcohol Use: Yes     Comment: 80 oz beer daily, 1/2 pint liquor daily  . Drug Use: No  . Sexual Activity: Not Currently    Birth Control/ Protection: None   Other Topics Concern  . None   Social History Narrative   Additional History:    Sleep: Poor  Appetite:  Good   Assessment:   Musculoskeletal: Strength & Muscle Tone: within normal limits Gait & Station: normal Patient leans: N/A   Psychiatric Specialty Exam: Physical Exam  Constitutional: He appears well-developed and well-nourished.  HENT:  Head: Normocephalic and atraumatic.  Eyes: Conjunctivae are normal. Pupils are equal, round, and reactive to light.  Neck: Normal range of motion.  Cardiovascular: Normal heart sounds.   Respiratory: Effort normal.  GI: Soft.  Musculoskeletal: Normal range of motion.  Neurological: He is alert.  Skin: Skin is warm and dry.  Psychiatric: Judgment and thought content normal. His affect is blunt. His affect is not angry. His speech is delayed. His speech is not rapid and/or pressured. He is withdrawn. He is not hyperactive, not actively hallucinating and not combative. Cognition and memory are normal.  Patient continues to stay in his room a lot. He is easy to arouse however. He is not psychotic and  shows improved insight.    Review of Systems  Constitutional: Negative.   HENT: Negative.   Eyes: Negative.   Respiratory: Negative.   Cardiovascular: Negative.   Gastrointestinal: Negative.   Musculoskeletal: Negative.   Skin: Negative.   Neurological: Negative.   Psychiatric/Behavioral: Positive for substance abuse. Negative for depression, suicidal ideas, hallucinations and memory loss. The patient is nervous/anxious and  has insomnia.     Blood pressure 111/71, pulse 79, temperature 98 F (36.7 C), temperature source Oral, resp. rate 20, height  (1.778 m), weight 73.936 kg (163 lb), SpO2 97 %.Body mass index is 23.39 kg/(m^2).  General Appearance: Casual  Eye Contact::  Good  Speech:  Normal Rate  Volume:  Normal  Mood:  Dysphoric  Affect:  Congruent  Thought Process:  Logical  Orientation:  Full (Time, Place, and Person)  Thought Content:  Negative  Suicidal Thoughts:  No  Homicidal Thoughts:  No  Memory:  Immediate;   Good Recent;   Good Remote;   Good  Judgement:  Fair  Insight:  Fair  Psychomotor Activity:  Decreased  Concentration:  Good  Recall:  Good  Fund of Knowledge:Good  Language: Good  Akathisia:  No  Handed:  Right  AIMS (if indicated):     Assets:  Communication Skills Desire for Improvement Physical Health Resilience  ADL's:  Intact  Cognition: WNL  Sleep:  Number of Hours: 7     Current Medications: Current Facility-Administered Medications  Medication Dose Route Frequency Provider Last Rate Last Dose  . acetaminophen (TYLENOL) tablet 650 mg  650 mg Oral Q6H PRN Audery Amel, MD      . alum & mag hydroxide-simeth (MAALOX/MYLANTA) 200-200-20 MG/5ML suspension 30 mL  30 mL Oral Q4H PRN Audery Amel, MD      . amLODipine (NORVASC) tablet 2.5 mg  2.5 mg Oral Daily Jimmy Footman, MD   2.5 mg at 11/02/14 0958  . citalopram (CELEXA) tablet 20 mg  20 mg Oral QHS Jimmy Footman, MD   20 mg at 11/01/14 2111  . hydrocortisone cream 0.5 %   Topical TID Jimmy Footman, MD      . hydrOXYzine (ATARAX/VISTARIL) tablet 50 mg  50 mg Oral QHS Audery Amel, MD   50 mg at 11/01/14 2111  . magnesium hydroxide (MILK OF MAGNESIA) suspension 30 mL  30 mL Oral Daily PRN Audery Amel, MD      . nicotine (NICOTROL) 10 MG inhaler 1 continuous puffing  1 continuous puffing Inhalation PRN Brandy Hale, MD   1 continuous puffing at 10/23/14 2207  .  traZODone (DESYREL) tablet 150 mg  150 mg Oral QHS Jimmy Footman, MD   150 mg at 11/01/14 2110    Lab Results: No results found for this or any previous visit (from the past 48 hour(s)).  Physical Findings: AIMS:  , ,  ,  ,    CIWA:  CIWA-Ar Total: 5 COWS:     Treatment Plan Summary: Medication management, Plan Patient will continue on current medication including citalopram. He is taking trazodone at night for sleep. Blood pressure is doing well and doesn't require any change to blood pressure medicine. Patient continue the Nicotrol for tobacco use. Continue awaiting plan for going to REM scope. At his request to try and get better sleep I have added Vistaril 50 mg at night. I was hesitant to increase his trazodone dose for risk of side effects. Continue to follow up tomorrow. and  Medical Decision Making:  Established Problem, Stable/Improving (1), Review of Psycho-Social Stressors (1), Review of Medication Regimen & Side Effects (2) and Review of New Medication or Change in Dosage (2)     John Clapacs 11/02/2014, 2:35 PM

## 2014-11-02 NOTE — Progress Notes (Signed)
Patient rates his depression a "5"; continues to have SI, but denies having a plan, and contracts for safety; denies any anxiety or hallucinations; has been calm, quiet, and cooperative throughout the shift; no further voiced complaints; continue to monitor.

## 2014-11-02 NOTE — Plan of Care (Signed)
Problem: Alteration in mood & ability to function due to Goal: LTG-Pt reports reduction in suicidal thoughts (Patient reports reduction in suicidal thoughts and is able to verbalize a safety plan for whenever patient is feeling suicidal)  Outcome: Progressing Patient denies feeling suicidal or having anymore SI at present.

## 2014-11-03 MED ORDER — CITALOPRAM HYDROBROMIDE 20 MG PO TABS: 20.0000 mg | ORAL_TABLET | Freq: Every day | ORAL | Status: DC

## 2014-11-03 NOTE — Progress Notes (Signed)
Patient denies feeling depressed or anxious anymore; denies having any thoughts of suicide and contracts for safety; states "I talked to my kids today, and it made me feel so much better. Patient has been calm and cooperative throughout the shift; observed smiling frequently out in the milieu; no voiced complaints; continue to monitor.

## 2014-11-03 NOTE — Progress Notes (Signed)
Recreation Therapy Notes  INPATIENT RECREATION TR PLAN  Patient Details Name: Matthew [name] MRN: [id number] DOB: 01/15/1961 Today's Date: 11/03/2014  Rec Therapy Plan Is patient appropriate for Therapeutic Recreation?: Yes Treatment times per week: At least 2 times a week TR Treatment/Interventions: 1:1 session, Group participation (Comment) (Appropriate participation in daily recreation therapy tx)  Discharge Criteria Pt will be discharged from therapy if:: Discharged Treatment plan/goals/alternatives discussed and agreed upon by:: Patient/family  Discharge Summary Short term goals set: See Care Plan Short term goals met: Complete Progress toward goals comments: One-to-one attended Which groups?: Coping skills, Other (Comment), Self-esteem, Leisure education, Communication, Social skills (Self-expression) One-to-one attended: Self-esteem Reason goals not met: N/A Therapeutic equipment acquired: None Reason patient discharged from therapy: Discharge from hospital Pt/family agrees with progress & goals achieved: Yes Date patient discharged from therapy: 11/03/14   , M, LRT/CTRS 11/03/2014, 4:53 PM  

## 2014-11-03 NOTE — Progress Notes (Signed)
Nurs Dischg Note:  D:Patient denies SI/HI at this time. Pt appears calm and cooperative, and no distress noted.returning to family to live.  A: All Personal items in locker returned to pt. Instructed on discharge information ,received prescriptions and seven day supply verbalize undersanding Pt escorted out of the building.  R:  Pt State he will comply with outpatient services, and take MEDS as prescribed.

## 2014-11-03 NOTE — Progress Notes (Signed)
AVS H&P Discharge Summary faxed to [facility] for hospital follow-up °

## 2014-11-03 NOTE — [facility] Suicide Risk Assessment (Signed)
[facility] Discharge Suicide Risk Assessment   Demographic Factors:  Male, Divorced or widowed, Low socioeconomic status, Living alone and Unemployed  Total Time spent with patient: 30 minutes   Psychiatric Specialty Exam: Physical Exam  ROS                                                         Have you used any form of tobacco in the last 30 days? (Cigarettes, Smokeless Tobacco, Cigars, and/or Pipes): Yes  Has this patient used any form of tobacco in the last 30 days? (Cigarettes, Smokeless Tobacco, Cigars, and/or Pipes) Yes, A prescription for an FDA-approved tobacco cessation medication was offered at discharge and the patient refused  Mental Status Per Nursing Assessment::   On Admission:  Suicidal ideation indicated by patient, Suicide plan  Current Mental Status by Physician: denies SI, HI or A/VH.  Hopeful and future oriented  Loss Factors: Legal issues and Financial problems/change in socioeconomic status  Historical Factors: Family history of mental illness or substance abuse  Risk Reduction Factors:   Sense of responsibility to family, Religious beliefs about death and Positive social support  Continued Clinical Symptoms:  Depression:   Comorbid alcohol abuse/dependence Alcohol/Substance Abuse/Dependencies  Cognitive Features That Contribute To Risk:  None    Suicide Risk:  Minimal: No identifiable suicidal ideation.  Patients presenting with no risk factors but with morbid ruminations; may be classified as minimal risk based on the severity of the depressive symptoms  Principal Problem: Major depressive disorder, single episode, severe without psychotic features Discharge Diagnoses:  Patient Active Problem List   Diagnosis Date Noted  . Eczema [L30.9] 10/30/2014  . Alcohol use disorder, severe, dependence [F10.20] 10/24/2014  . Tobacco use disorder [Z72.0] 10/24/2014  . HTN (hypertension), benign [I10] 10/24/2014  . Major depressive  disorder, single episode, severe without psychotic features [F32.2] 10/24/2014  . Alcohol withdrawal [F10.239] 10/23/2014      Plan Of Care/Follow-up recommendations:  Other:  f/u with outpatient psychiatry  Is patient on multiple antipsychotic therapies at discharge:  No   Has Patient had three or more failed trials of antipsychotic monotherapy by history:  No  Recommended Plan for Multiple Antipsychotic Therapies: NA    Jimmy Footman 11/03/2014, 11:01 AM

## 2014-11-03 NOTE — Progress Notes (Signed)
  [facility] Adult Case Management Discharge Plan :  Will you be returning to the same living situation after discharge:  No. At discharge, do you have transportation home?: Yes,   Son to pick up Do you have the ability to pay for your medications: Yes,     Release of information consent forms completed and in the chart;  Patient's signature needed at discharge.  Patient to Follow up at: Follow-up Information    Go to [facility].   Why:  Walk-ins Monday, Wednesday, Friday between 8am-3pm for , Hospital Follow up, Outpatient Medication Management, referral for SAIOP Therapy   Contact information:   [address] [place] [address] [phone]; [phone]       Patient denies SI/HI: Yes,       Safety Planning and Suicide Prevention discussed: Yes,  with patient  Have you used any form of tobacco in the last 30 days? (Cigarettes, Smokeless Tobacco, Cigars, and/or Pipes): Yes  Has patient been referred to the Quitline?: Patient refused referral  Matthew [name], Matthew [name]. LCSW 11/03/2014, 3:33 PM

## 2014-11-03 NOTE — [facility] Suicide Risk Assessment (Signed)
[facility] INPATIENT:  Family/Significant Other Suicide Prevention Education  Suicide Prevention Education:  Patient Refusal for Family/Significant Other Suicide Prevention Education: The patient Matthew [name] has refused to provide written consent for family/significant other to be provided Family/Significant Other Suicide Prevention Education during admission and/or prior to discharge.  Physician notified.  Glennon Mac, LCSW 11/03/2014, 3:35 PM

## 2014-12-15 DIAGNOSIS — J939 Pneumothorax, unspecified: Secondary | ICD-10-CM

## 2014-12-15 HISTORY — DX: Pneumothorax, unspecified: J93.9

## 2014-12-22 ENCOUNTER — Emergency Department: Payer: Self-pay

## 2014-12-22 ENCOUNTER — Encounter: Payer: Self-pay | Admitting: Emergency Medicine

## 2014-12-22 ENCOUNTER — Inpatient Hospital Stay
Admission: EM | Admit: 2014-12-22 | Discharge: 2014-12-27 | DRG: 200 | Disposition: A | Discharge status: Home / Self Care | Payer: Self-pay | Attending: Surgery | Admitting: Surgery

## 2014-12-22 ENCOUNTER — Inpatient Hospital Stay: Payer: Self-pay

## 2014-12-22 DIAGNOSIS — S42001A Fracture of unspecified part of right clavicle, initial encounter for closed fracture: Secondary | ICD-10-CM

## 2014-12-22 DIAGNOSIS — R0902 Hypoxemia: Secondary | ICD-10-CM | POA: Diagnosis present

## 2014-12-22 DIAGNOSIS — R739 Hyperglycemia, unspecified: Secondary | ICD-10-CM | POA: Diagnosis present

## 2014-12-22 DIAGNOSIS — S40211A Abrasion of right shoulder, initial encounter: Secondary | ICD-10-CM

## 2014-12-22 DIAGNOSIS — I10 Essential (primary) hypertension: Secondary | ICD-10-CM | POA: Diagnosis present

## 2014-12-22 DIAGNOSIS — J939 Pneumothorax, unspecified: Secondary | ICD-10-CM

## 2014-12-22 DIAGNOSIS — F10239 Alcohol dependence with withdrawal, unspecified: Secondary | ICD-10-CM | POA: Diagnosis present

## 2014-12-22 DIAGNOSIS — F329 Major depressive disorder, single episode, unspecified: Secondary | ICD-10-CM | POA: Diagnosis present

## 2014-12-22 DIAGNOSIS — Z79891 Long term (current) use of opiate analgesic: Secondary | ICD-10-CM

## 2014-12-22 DIAGNOSIS — F17210 Nicotine dependence, cigarettes, uncomplicated: Secondary | ICD-10-CM | POA: Diagnosis present

## 2014-12-22 DIAGNOSIS — S40011A Contusion of right shoulder, initial encounter: Secondary | ICD-10-CM

## 2014-12-22 DIAGNOSIS — R918 Other nonspecific abnormal finding of lung field: Secondary | ICD-10-CM | POA: Insufficient documentation

## 2014-12-22 DIAGNOSIS — Z8611 Personal history of tuberculosis: Secondary | ICD-10-CM

## 2014-12-22 DIAGNOSIS — S42009A Fracture of unspecified part of unspecified clavicle, initial encounter for closed fracture: Secondary | ICD-10-CM | POA: Insufficient documentation

## 2014-12-22 DIAGNOSIS — Y9355 Activity, bike riding: Secondary | ICD-10-CM

## 2014-12-22 DIAGNOSIS — Z09 Encounter for follow-up examination after completed treatment for conditions other than malignant neoplasm: Secondary | ICD-10-CM

## 2014-12-22 DIAGNOSIS — S2249XA Multiple fractures of ribs, unspecified side, initial encounter for closed fracture: Secondary | ICD-10-CM | POA: Insufficient documentation

## 2014-12-22 DIAGNOSIS — Z79899 Other long term (current) drug therapy: Secondary | ICD-10-CM

## 2014-12-22 DIAGNOSIS — S2241XA Multiple fractures of ribs, right side, initial encounter for closed fracture: Secondary | ICD-10-CM

## 2014-12-22 DIAGNOSIS — S270XXA Traumatic pneumothorax, initial encounter: Principal | ICD-10-CM

## 2014-12-22 LAB — COMPREHENSIVE METABOLIC PANEL
ALBUMIN: 4.4 g/dL (ref 3.5–5.0)
ALK PHOS: 78 U/L (ref 38–126)
ALT: 26 U/L (ref 17–63)
AST: 40 U/L (ref 15–41)
Anion gap: 10 (ref 5–15)
BUN: 7 mg/dL (ref 6–20)
CHLORIDE: 101 mmol/L (ref 101–111)
CO2: 24 mmol/L (ref 22–32)
Calcium: 9.3 mg/dL (ref 8.9–10.3)
Creatinine, Ser: 0.7 mg/dL (ref 0.61–1.24)
GFR calc Af Amer: >60 mL/min (ref >60)
Glucose, Bld: 129 mg/dL — ABNORMAL HIGH (ref 65–99)
Potassium: 4.1 mmol/L (ref 3.5–5.1)
Sodium: 135 mmol/L (ref 135–145)
Total Bilirubin: 1.4 mg/dL — ABNORMAL HIGH (ref 0.3–1.2)
Total Protein: 7.6 g/dL (ref 6.5–8.1)

## 2014-12-22 LAB — ETHANOL: Alcohol, Ethyl (B): <5 mg/dL (ref <5)

## 2014-12-22 LAB — URINALYSIS COMPLETE WITH MICROSCOPIC (ARMC ONLY)
BACTERIA UA: NONE SEEN
Bilirubin Urine: NEGATIVE
GLUCOSE, UA: NEGATIVE mg/dL
LEUKOCYTES UA: NEGATIVE
Nitrite: NEGATIVE
Protein, ur: NEGATIVE mg/dL
SQUAMOUS EPITHELIAL / LPF: NONE SEEN
Specific Gravity, Urine: 1.039 — ABNORMAL HIGH (ref 1.005–1.030)
pH: 6 (ref 5.0–8.0)

## 2014-12-22 LAB — CBC WITH DIFFERENTIAL/PLATELET
BASOS PCT: 0 %
Basophils Absolute: 0 10*3/uL (ref 0–0.1)
EOS PCT: 0 %
Eosinophils Absolute: 0 10*3/uL (ref 0–0.7)
HCT: 39.2 % — ABNORMAL LOW (ref 40.0–52.0)
Hemoglobin: 13.6 g/dL (ref 13.0–18.0)
Lymphocytes Relative: 9 %
Lymphs Abs: 0.7 10*3/uL — ABNORMAL LOW (ref 1.0–3.6)
MCH: 33.9 pg (ref 26.0–34.0)
MCHC: 34.6 g/dL (ref 32.0–36.0)
MCV: 97.9 fL (ref 80.0–100.0)
MONO ABS: 0.8 10*3/uL (ref 0.2–1.0)
Monocytes Relative: 10 %
NEUTROS PCT: 81 %
Neutro Abs: 6.1 10*3/uL (ref 1.4–6.5)
PLATELETS: 169 10*3/uL (ref 150–440)
RBC: 4 MIL/uL — AB (ref 4.40–5.90)
RDW: 13.3 % (ref 11.5–14.5)
WBC: 7.5 10*3/uL (ref 3.8–10.6)

## 2014-12-22 LAB — GLUCOSE, CAPILLARY: Glucose-Capillary: 146 mg/dL — ABNORMAL HIGH (ref 65–99)

## 2014-12-22 MED ORDER — TETANUS-DIPHTH-ACELL PERTUSSIS 5-2.5-18.5 LF-MCG/0.5 IM SUSP
0.5000 mL | Freq: Once | INTRAMUSCULAR | Status: AC
  Administered 2014-12-22: 0.5 mL via INTRAMUSCULAR
  Filled 2014-12-22: qty 0.5

## 2014-12-22 MED ORDER — PNEUMOCOCCAL VAC POLYVALENT 25 MCG/0.5ML IJ INJ
0.5000 mL | INJECTION | INTRAMUSCULAR | Status: AC
  Administered 2014-12-23: 0.5 mL via INTRAMUSCULAR
  Filled 2014-12-22: qty 0.5

## 2014-12-22 MED ORDER — KCL IN DEXTROSE-NACL 20-5-0.45 MEQ/L-%-% IV SOLN
INTRAVENOUS | Status: DC
  Administered 2014-12-22 – 2014-12-27 (×8): via INTRAVENOUS
  Filled 2014-12-22 (×10): qty 1000

## 2014-12-22 MED ORDER — FOLIC ACID 1 MG PO TABS
1.0000 mg | ORAL_TABLET | Freq: Every day | ORAL | Status: DC
  Administered 2014-12-22 – 2014-12-27 (×6): 1 mg via ORAL
  Filled 2014-12-22 (×6): qty 1

## 2014-12-22 MED ORDER — LORAZEPAM 2 MG/ML IJ SOLN
0.0000 mg | INTRAMUSCULAR | Status: AC | PRN
  Administered 2014-12-22: 1 mg via INTRAVENOUS
  Administered 2014-12-23 – 2014-12-24 (×2): 2 mg via INTRAVENOUS
  Filled 2014-12-22 (×3): qty 1

## 2014-12-22 MED ORDER — BUPIVACAINE HCL 0.25 % IJ SOLN
30.0000 mL | Freq: Once | INTRAMUSCULAR | Status: DC
  Filled 2014-12-22: qty 30

## 2014-12-22 MED ORDER — OXYCODONE-ACETAMINOPHEN 5-325 MG PO TABS
2.0000 | ORAL_TABLET | Freq: Once | ORAL | Status: AC
  Administered 2014-12-22: 2 via ORAL
  Filled 2014-12-22: qty 2

## 2014-12-22 MED ORDER — BUPIVACAINE HCL (PF) 0.25 % IJ SOLN
30.0000 mL | Freq: Once | INTRAMUSCULAR | Status: AC
  Administered 2014-12-22: 30 mL
  Filled 2014-12-22: qty 30

## 2014-12-22 MED ORDER — TRAZODONE HCL 50 MG PO TABS
150.0000 mg | ORAL_TABLET | Freq: Every day | ORAL | Status: DC
  Administered 2014-12-22 – 2014-12-26 (×5): 150 mg via ORAL
  Filled 2014-12-22 (×2): qty 1
  Filled 2014-12-22: qty 3
  Filled 2014-12-22 (×2): qty 1

## 2014-12-22 MED ORDER — CITALOPRAM HYDROBROMIDE 20 MG PO TABS
20.0000 mg | ORAL_TABLET | Freq: Every day | ORAL | Status: DC
  Administered 2014-12-22 – 2014-12-26 (×5): 20 mg via ORAL
  Filled 2014-12-22 (×5): qty 1

## 2014-12-22 MED ORDER — PANTOPRAZOLE SODIUM 40 MG IV SOLR
40.0000 mg | Freq: Every day | INTRAVENOUS | Status: DC
  Administered 2014-12-22 – 2014-12-24 (×3): 40 mg via INTRAVENOUS
  Filled 2014-12-22 (×4): qty 40

## 2014-12-22 MED ORDER — ADULT MULTIVITAMIN W/MINERALS CH
1.0000 | ORAL_TABLET | Freq: Every day | ORAL | Status: DC
  Administered 2014-12-22 – 2014-12-27 (×6): 1 via ORAL
  Filled 2014-12-22 (×6): qty 1

## 2014-12-22 MED ORDER — VITAMIN B-1 100 MG PO TABS
100.0000 mg | ORAL_TABLET | Freq: Every day | ORAL | Status: DC
  Administered 2014-12-22 – 2014-12-27 (×6): 100 mg via ORAL
  Filled 2014-12-22 (×6): qty 1

## 2014-12-22 MED ORDER — NICOTINE 14 MG/24HR TD PT24
14.0000 mg | MEDICATED_PATCH | Freq: Every day | TRANSDERMAL | Status: DC
  Administered 2014-12-22 – 2014-12-27 (×6): 14 mg via TRANSDERMAL
  Filled 2014-12-22 (×6): qty 1

## 2014-12-22 MED ORDER — ONDANSETRON 4 MG PO TBDP: 4.0000 mg | ORAL_TABLET | Freq: Four times a day (QID) | ORAL | Status: DC | PRN

## 2014-12-22 MED ORDER — IOHEXOL 300 MG/ML  SOLN
50.0000 mL | Freq: Once | INTRAMUSCULAR | Status: AC | PRN
  Administered 2014-12-22: 50 mL via INTRAVENOUS

## 2014-12-22 MED ORDER — ACETAMINOPHEN 325 MG PO TABS
650.0000 mg | ORAL_TABLET | Freq: Four times a day (QID) | ORAL | Status: DC | PRN
  Administered 2014-12-24: 650 mg via ORAL
  Filled 2014-12-22: qty 2

## 2014-12-22 MED ORDER — LORAZEPAM 2 MG/ML IJ SOLN: 0.0000 mg | INTRAMUSCULAR | Status: AC

## 2014-12-22 MED ORDER — AMLODIPINE BESYLATE 5 MG PO TABS
2.5000 mg | ORAL_TABLET | Freq: Every day | ORAL | Status: DC
  Administered 2014-12-22: 2.5 mg via ORAL
  Filled 2014-12-22: qty 1

## 2014-12-22 MED ORDER — OXYCODONE HCL 5 MG PO TABS
5.0000 mg | ORAL_TABLET | ORAL | Status: DC | PRN
  Administered 2014-12-23 – 2014-12-27 (×9): 10 mg via ORAL
  Filled 2014-12-22 (×9): qty 2

## 2014-12-22 MED ORDER — HYDROMORPHONE HCL 1 MG/ML IJ SOLN
1.0000 mg | INTRAMUSCULAR | Status: DC | PRN
  Administered 2014-12-22 – 2014-12-26 (×13): 1 mg via INTRAVENOUS
  Filled 2014-12-22 (×13): qty 1

## 2014-12-22 MED ORDER — HYDROMORPHONE HCL 1 MG/ML IJ SOLN
INTRAMUSCULAR | Status: AC
  Administered 2014-12-22: 1 mg via INTRAVENOUS
  Filled 2014-12-22: qty 1

## 2014-12-22 MED ORDER — ACETAMINOPHEN 650 MG RE SUPP: 650.0000 mg | Freq: Four times a day (QID) | RECTAL | Status: DC | PRN

## 2014-12-22 MED ORDER — MORPHINE SULFATE 4 MG/ML IJ SOLN
4.0000 mg | Freq: Once | INTRAMUSCULAR | Status: AC
  Administered 2014-12-22: 4 mg via INTRAVENOUS
  Filled 2014-12-22: qty 1

## 2014-12-22 MED ORDER — ENOXAPARIN SODIUM 40 MG/0.4ML ~~LOC~~ SOLN
40.0000 mg | SUBCUTANEOUS | Status: DC
  Administered 2014-12-22 – 2014-12-27 (×6): 40 mg via SUBCUTANEOUS
  Filled 2014-12-22 (×6): qty 0.4

## 2014-12-22 MED ORDER — LORAZEPAM 2 MG/ML IJ SOLN: 1.0000 mg | Freq: Four times a day (QID) | INTRAMUSCULAR | Status: AC | PRN

## 2014-12-22 MED ORDER — ONDANSETRON HCL 4 MG/2ML IJ SOLN: 4.0000 mg | Freq: Four times a day (QID) | INTRAMUSCULAR | Status: DC | PRN

## 2014-12-22 MED ORDER — LORAZEPAM 1 MG PO TABS
1.0000 mg | ORAL_TABLET | Freq: Four times a day (QID) | ORAL | Status: AC | PRN
  Administered 2014-12-23 – 2014-12-24 (×4): 1 mg via ORAL
  Filled 2014-12-22 (×4): qty 1

## 2014-12-22 MED ORDER — AMLODIPINE BESYLATE 10 MG PO TABS
10.0000 mg | ORAL_TABLET | Freq: Every day | ORAL | Status: DC
  Administered 2014-12-23 – 2014-12-27 (×5): 10 mg via ORAL
  Filled 2014-12-22 (×5): qty 1

## 2014-12-22 MED ORDER — THIAMINE HCL 100 MG/ML IJ SOLN
100.0000 mg | Freq: Every day | INTRAMUSCULAR | Status: DC
  Filled 2014-12-22: qty 2
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
## 2014-12-22 NOTE — ED Provider Notes (Signed)
ED ECG REPORT I, Jene Every, the attending physician, personally viewed and interpreted this ECG.  Date: 12/22/2014 EKG Time: 9:39 AM Rate: 98 Rhythm: normal sinus rhythm QRS Axis: normal Intervals: normal ST/T Wave abnormalities: normal Conduction Disutrbances: none Narrative Interpretation: unremarkable   Jene Every, MD 12/22/14 1059
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
## 2014-12-22 NOTE — Op Note (Signed)
*   No surgery found *  3:34 PM  PATIENT:  Matthew [name]  54 y.o. male  PRE-OPERATIVE DIAGNOSIS:  Pneumothorax   POST-OPERATIVE DIAGNOSIS:  Pneumothorax  PROCEDURE:  Right tube thoracostomy  SURGEON:  [place]    ASSISTANTS: none   ANESTHESIA:   local  EBL:      DRAINS: none   LOCAL MEDICATIONS USED:  MARCAINE      DISPOSITION OF SPECIMEN:  N/A   DICTATION: .Dragon Dictation with the patient supine position his right chest was prepped with Betadine and draped sterile towels. He had been given 1 mg of IV pain medication prior the initiation of the procedure. 0.25% Marcaine was used for local anesthesia. Incision was made over the anterior axillary line and finger dissection carried out down over the rib cage. A 20 French chest tube was inserted without difficulty with immediate return of air. The tube was sutured in place and connected to a Pleur-evac system. Chest x-ray is pending.  PLAN OF CARE: Admit to inpatient   PATIENT DISPOSITION:  Admit to hospital floor as requested   Salley Hews, MD
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
## 2014-12-22 NOTE — ED Notes (Signed)
Patient transported to CT 

## 2014-12-23 ENCOUNTER — Inpatient Hospital Stay: Payer: Self-pay

## 2014-12-23 ENCOUNTER — Inpatient Hospital Stay: Payer: MEDICAID

## 2014-12-23 DIAGNOSIS — S2241XA Multiple fractures of ribs, right side, initial encounter for closed fracture: Secondary | ICD-10-CM

## 2014-12-23 DIAGNOSIS — J9819 Other pulmonary collapse: Secondary | ICD-10-CM

## 2014-12-23 DIAGNOSIS — J939 Pneumothorax, unspecified: Secondary | ICD-10-CM

## 2014-12-23 DIAGNOSIS — S27329A Contusion of lung, unspecified, initial encounter: Secondary | ICD-10-CM

## 2014-12-23 DIAGNOSIS — S270XXA Traumatic pneumothorax, initial encounter: Secondary | ICD-10-CM

## 2014-12-23 DIAGNOSIS — S42001A Fracture of unspecified part of right clavicle, initial encounter for closed fracture: Secondary | ICD-10-CM

## 2014-12-23 DIAGNOSIS — R918 Other nonspecific abnormal finding of lung field: Secondary | ICD-10-CM

## 2014-12-23 LAB — CBC
HEMATOCRIT: 36.7 % — AB (ref 40.0–52.0)
HEMOGLOBIN: 12.5 g/dL — AB (ref 13.0–18.0)
MCH: 33.7 pg (ref 26.0–34.0)
MCHC: 34 g/dL (ref 32.0–36.0)
MCV: 99.2 fL (ref 80.0–100.0)
Platelets: 147 10*3/uL — ABNORMAL LOW (ref 150–440)
RBC: 3.7 MIL/uL — ABNORMAL LOW (ref 4.40–5.90)
RDW: 13.6 % (ref 11.5–14.5)
WBC: 6.7 10*3/uL (ref 3.8–10.6)

## 2014-12-23 LAB — BASIC METABOLIC PANEL
Anion gap: 6 (ref 5–15)
BUN: 8 mg/dL (ref 6–20)
CO2: 29 mmol/L (ref 22–32)
Calcium: 9 mg/dL (ref 8.9–10.3)
Chloride: 101 mmol/L (ref 101–111)
Creatinine, Ser: 0.69 mg/dL (ref 0.61–1.24)
GFR calc Af Amer: >60 mL/min (ref >60)
Glucose, Bld: 142 mg/dL — ABNORMAL HIGH (ref 65–99)
Potassium: 4.2 mmol/L (ref 3.5–5.1)
SODIUM: 136 mmol/L (ref 135–145)

## 2014-12-23 LAB — HEMOGLOBIN A1C: Hgb A1c MFr Bld: 4.4 % (ref 4.0–6.0)

## 2014-12-23 LAB — PROCALCITONIN: Procalcitonin: <0.1 ng/mL

## 2014-12-23 MED ORDER — SENNOSIDES-DOCUSATE SODIUM 8.6-50 MG PO TABS
2.0000 | ORAL_TABLET | Freq: Two times a day (BID) | ORAL | Status: DC
  Administered 2014-12-23 – 2014-12-27 (×9): 2 via ORAL
  Filled 2014-12-23 (×9): qty 2

## 2014-12-23 MED ORDER — VANCOMYCIN HCL IN DEXTROSE 1-5 GM/200ML-% IV SOLN
1000.0000 mg | Freq: Three times a day (TID) | INTRAVENOUS | Status: DC
  Administered 2014-12-23 – 2014-12-24 (×3): 1000 mg via INTRAVENOUS
  Filled 2014-12-23 (×6): qty 200

## 2014-12-23 MED ORDER — PIPERACILLIN-TAZOBACTAM 3.375 G IVPB
3.3750 g | Freq: Three times a day (TID) | INTRAVENOUS | Status: DC
  Administered 2014-12-23 – 2014-12-24 (×4): 3.375 g via INTRAVENOUS
  Filled 2014-12-23 (×8): qty 50

## 2014-12-23 MED ORDER — VANCOMYCIN HCL 10 G IV SOLR
1500.0000 mg | INTRAVENOUS | Status: AC
  Administered 2014-12-23: 1500 mg via INTRAVENOUS
  Filled 2014-12-23: qty 1500

## 2014-12-23 MED ORDER — PIPERACILLIN-TAZOBACTAM 3.375 G IVPB 30 MIN: 3.3750 g | Freq: Three times a day (TID) | INTRAVENOUS | Status: DC
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
## 2014-12-23 NOTE — Progress Notes (Signed)
Evaluated Matthew [name].  Appears sleepy.  C/o pain.  Very small breaths to commands.  Lung sounds bilaterally.  Will f/u chest xray.  Will readdress pain issues.  Did receive ativan twice and dilaudid for pain.

## 2014-12-23 NOTE — Progress Notes (Signed)
Spoke with Dr Juliann Pulse and informed that over the course of the night increased BNC from 3L to 5L. Patient is breathing shallowly d/t pain. When he does take several deep breaths oxygen sats increase to 94/95% from 90/92%. No further orders
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
## 2014-12-24 ENCOUNTER — Inpatient Hospital Stay: Payer: Self-pay

## 2014-12-24 DIAGNOSIS — S270XXA Traumatic pneumothorax, initial encounter: Secondary | ICD-10-CM | POA: Insufficient documentation

## 2014-12-24 DIAGNOSIS — S42009A Fracture of unspecified part of unspecified clavicle, initial encounter for closed fracture: Secondary | ICD-10-CM | POA: Insufficient documentation

## 2014-12-24 DIAGNOSIS — S40211A Abrasion of right shoulder, initial encounter: Secondary | ICD-10-CM | POA: Insufficient documentation

## 2014-12-24 DIAGNOSIS — S2249XA Multiple fractures of ribs, unspecified side, initial encounter for closed fracture: Secondary | ICD-10-CM | POA: Insufficient documentation

## 2014-12-24 DIAGNOSIS — R918 Other nonspecific abnormal finding of lung field: Secondary | ICD-10-CM | POA: Insufficient documentation

## 2014-12-24 DIAGNOSIS — S40011A Contusion of right shoulder, initial encounter: Secondary | ICD-10-CM

## 2014-12-24 DIAGNOSIS — S42001A Fracture of unspecified part of right clavicle, initial encounter for closed fracture: Secondary | ICD-10-CM

## 2014-12-24 DIAGNOSIS — S2241XA Multiple fractures of ribs, right side, initial encounter for closed fracture: Secondary | ICD-10-CM

## 2014-12-24 LAB — CBC
HCT: 34.7 % — ABNORMAL LOW (ref 40.0–52.0)
Hemoglobin: 12 g/dL — ABNORMAL LOW (ref 13.0–18.0)
MCH: 34.2 pg — ABNORMAL HIGH (ref 26.0–34.0)
MCHC: 34.5 g/dL (ref 32.0–36.0)
MCV: 99.2 fL (ref 80.0–100.0)
Platelets: 145 10*3/uL — ABNORMAL LOW (ref 150–440)
RBC: 3.49 MIL/uL — ABNORMAL LOW (ref 4.40–5.90)
RDW: 12.8 % (ref 11.5–14.5)
WBC: 7 10*3/uL (ref 3.8–10.6)
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
## 2014-12-24 NOTE — Clinical Social Work Note (Signed)
CSW asked to see patient regarding offering resources for ETOH abuse and rehab. CSW went in to speak with patient this afternoon and patient was sleeping soundly and would not awaken for CSW. CSW will return to try and offer assistance. York Spaniel MSW,LCSW [phone]
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
## 2014-12-25 ENCOUNTER — Inpatient Hospital Stay: Payer: Self-pay

## 2014-12-25 LAB — PROCALCITONIN

## 2014-12-25 MED ORDER — POLYETHYLENE GLYCOL 3350 17 G PO PACK
17.0000 g | PACK | Freq: Every day | ORAL | Status: DC
  Administered 2014-12-25 – 2014-12-27 (×3): 17 g via ORAL
  Filled 2014-12-25 (×3): qty 1

## 2014-12-25 MED ORDER — PANTOPRAZOLE SODIUM 40 MG PO TBEC
40.0000 mg | DELAYED_RELEASE_TABLET | Freq: Every day | ORAL | Status: DC
  Administered 2014-12-25 – 2014-12-26 (×2): 40 mg via ORAL
  Filled 2014-12-25 (×2): qty 1
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
## 2014-12-25 NOTE — Progress Notes (Signed)
IV-Po conversion Patient is currently on protonix IV- pt meets the criteria to be switched from IV to PO. Criteria below: Has a documented ability to take oral medications (tolerating diet of full liquids or better or gastric tube feedings for >24 hours OR taking other scheduled oral medications for >24hours). Expected plan for continued treatment for at least 1 day. Per protocol pt will be switched from protonix  IV to 40mp po Matthew [name] Matthew [name], Pharm.Matthew Clinical Pharmacist 12/25/2014
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
## 2014-12-26 ENCOUNTER — Inpatient Hospital Stay: Payer: Self-pay

## 2014-12-26 MED ORDER — MAGNESIUM HYDROXIDE 400 MG/5ML PO SUSP
30.0000 mL | Freq: Every day | ORAL | Status: DC
  Administered 2014-12-26: 30 mL via ORAL
  Filled 2014-12-26: qty 30
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
## 2014-12-26 NOTE — Clinical Social Work Note (Signed)
CSW was able to speak with patient this afternoon but patient was not interested in talking much and was able to verbalize that he did not want assistance with his ETOH and did not want resources for rehab at this time.  York Spaniel MSW,LCSW [phone]

## 2014-12-26 NOTE — Progress Notes (Signed)
Talked to Dr. Michela Pitcher about pt not voiding since 0539 this am. Dr. Michela Pitcher advised getting pt up and moving and not to cath yet.

## 2014-12-27 ENCOUNTER — Inpatient Hospital Stay: Payer: Self-pay

## 2014-12-27 LAB — PROCALCITONIN: Procalcitonin: <0.05 ng/mL

## 2014-12-27 MED ORDER — OXYCODONE HCL 5 MG PO TABS: 5.0000 mg | ORAL_TABLET | ORAL | Status: DC | PRN
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
## 2014-12-27 NOTE — Discharge Instructions (Signed)
Acromioclavicular Injuries  The AC (acromioclavicular) joint is the joint in the shoulder where the collarbone (clavicle) meets the shoulder blade (scapula). The part of the shoulder blade connected to the collarbone is called the acromion. Common problems with and treatments for the AC joint are detailed below.  ARTHRITIS  Arthritis occurs when the joint has been injured and the smooth padding between the joints (cartilage) is lost. This is the wear and tear seen in most joints of the body if they have been overused. This causes the joint to produce pain and swelling which is worse with activity.   AC JOINT SEPARATION  AC joint separation means that the ligaments connecting the acromion of the shoulder blade and collarbone have been damaged, and the two bones no longer line up. AC separations can be anywhere from mild to severe, and are "graded" depending upon which ligaments are torn and how badly they are torn.   Grade I Injury: the least damage is done, and the AC joint still lines up.   Grade II Injury: damage to the ligaments which reinforce the AC joint. In a Grade II injury, these ligaments are stretched but not entirely torn. When stressed, the AC joint becomes painful and unstable.   Grade III Injury: AC and secondary ligaments are completely torn, and the collarbone is no longer attached to the shoulder blade. This results in deformity; a prominence of the end of the clavicle.  AC JOINT FRACTURE  AC joint fracture means that there has been a break in the bones of the AC joint, usually the end of the clavicle.  TREATMENT  TREATMENT OF AC ARTHRITIS   There is currently no way to replace the cartilage damaged by arthritis. The best way to improve the condition is to decrease the activities which aggravate the problem. Application of ice to the joint helps decrease pain and soreness (inflammation). The use of non-steroidal anti-inflammatory medication is helpful.   If less conservative measures do not  work, then cortisone shots (injections) may be used. These are anti-inflammatories; they decrease the soreness in the joint and swelling.   If non-surgical measures fail, surgery may be recommended. The procedure is generally removal of a portion of the end of the clavicle. This is the part of the collarbone closest to your acromion which is stabilized with ligaments to the acromion of the shoulder blade. This surgery may be performed using a tube-like instrument with a light (arthroscope) for looking into a joint. It may also be performed as an open surgery through a small incision by the surgeon. Most patients will have good range of motion within 6 weeks and may return to all activity including sports by 8-12 weeks, barring complications.  TREATMENT OF AN AC SEPARATION   The initial treatment is to decrease pain. This is best accomplished by immobilizing the arm in a sling and placing an ice pack to the shoulder for 20 to 30 minutes every 2 hours as needed. As the pain starts to subside, it is important to begin moving the fingers, wrist, elbow and eventually the shoulder in order to prevent a stiff or "frozen" shoulder. Instruction on when and how much to move the shoulder will be provided by your caregiver. The length of time needed to regain full motion and function depends on the amount or grade of the injury. Recovery from a Grade I AC separation usually takes 10 to 14 days, whereas a Grade III may take 6 to 8 weeks.   Grade   full activity with few restrictions. Treatment is also based on the activity demands of the injured shoulder. For example, a high level quarterback with an injured throwing arm will receive more aggressive treatment than someone with a desk job who rarely uses his/her arm for strenuous activities. In some cases, a painful lump may persist which could require a later surgery.  Surgery can be very successful, but the benefits must be weighed against the potential risks. TREATMENT OF AN AC JOINT FRACTURE Fracture treatment depends on the type of fracture. Sometimes a splint or sling may be all that is required. Other times surgery may be required for repair. This is more frequently the case when the ligaments supporting the clavicle are completely torn. Your caregiver will help you with these decisions and together you can decide what will be the best treatment. HOME CARE INSTRUCTIONS   Apply ice to the injury for 15-20 minutes each hour while awake for 2 days. Put the ice in a plastic bag and place a towel between the bag of ice and skin.  If a sling has been applied, wear it constantly for as long as directed by your caregiver, even at night. The sling or splint can be removed for bathing or showering or as directed. Be sure to keep the shoulder in the same place as when the sling is on. Do not lift the arm.  If a figure-of-eight splint has been applied it should be tightened gently by another person every day. Tighten it enough to keep the shoulders held back. Allow enough room to place the index finger between the body and strap. Loosen the splint immediately if there is numbness or tingling in the hands.  Take over-the-counter or prescription medicines for pain, discomfort or fever as directed by your caregiver.  If you or your child has received a follow up appointment, it is very important to keep that appointment in order to avoid long term complications, chronic pain or disability. SEEK MEDICAL CARE IF:   The pain is not relieved with medications.  There is increased swelling or discoloration that continues to get worse rather than better.  You or your child has been unable to follow up as instructed.  There is progressive numbness and tingling in the arm, forearm or hand. SEEK IMMEDIATE MEDICAL CARE IF:   The arm is numb, cold or pale.  There is  increasing pain in the hand, forearm or fingers. MAKE SURE YOU:   Understand these instructions.  Will watch your condition.  Will get help right away if you are not doing well or get worse. Document Released: 02/09/2005 Document Revised: 07/25/2011 Document Reviewed: 08/04/2008 [facility] Patient Information 2015 [place], [place]. This information is not intended to replace advice given to you by your health care provider. Make sure you discuss any questions you have with your health care provider.  Take medications as prescribed.  Please contact your doctor if you should experience any fever, increased redness, swelling, drainage or bright red bleeding from your chest tube insertion site and if you should have any questions or concerns.

## 2014-12-27 NOTE — Progress Notes (Signed)
Pt d/c to home today. IV removed intact.  Pt d/c instructions reviewed and all questions and concerns addressed.  Rx printed and given to pt w/all questions and concerns addressed.  Education printed on D/C paperwork and given to pt.  Pt was educated on the signs and symptoms of infection.  Pt was able to verbalize what to assess for while at home to help prevent risk of infection.  Pt was escorted by family to home.    

## 2015-01-05 ENCOUNTER — Telehealth: Payer: Self-pay

## 2015-01-05 DIAGNOSIS — S270XXD Traumatic pneumothorax, subsequent encounter: Secondary | ICD-10-CM

## 2015-01-05 NOTE — Telephone Encounter (Signed)
Called patient to inform him that he would need to have a Chest X-ray at 9am, the morning of his appointment 01/07/15 so that we can better evaluate his Pneumothorax.   Patient's phone is disconnected.  Call was made to patient's son, Matthew [name] at this time. Spoke with son, who will relay this information to his father.  Order placed at this time for chest x-ray.

## 2015-01-07 ENCOUNTER — Ambulatory Visit
Admission: RE | Admit: 2015-01-07 | Discharge: 2015-01-07 | Disposition: A | Discharge status: Home / Self Care | Payer: Self-pay | Source: Ambulatory Visit | Attending: General Surgery | Admitting: General Surgery

## 2015-01-07 ENCOUNTER — Encounter: Payer: Self-pay | Admitting: General Surgery

## 2015-01-07 ENCOUNTER — Ambulatory Visit (INDEPENDENT_AMBULATORY_CARE_PROVIDER_SITE_OTHER): Payer: Self-pay | Admitting: General Surgery

## 2015-01-07 ENCOUNTER — Telehealth: Payer: Self-pay | Admitting: General Surgery

## 2015-01-07 ENCOUNTER — Ambulatory Visit
Admission: RE | Admit: 2015-01-07 | Discharge: 2015-01-07 | Disposition: A | Discharge status: Home / Self Care | Payer: MEDICAID | Source: Ambulatory Visit | Attending: General Surgery | Admitting: General Surgery

## 2015-01-07 VITALS — BP 160/84 | HR 96 | Temp 98.8°F | Ht 71.0 in | Wt 161.0 lb

## 2015-01-07 DIAGNOSIS — M25421 Effusion, right elbow: Secondary | ICD-10-CM

## 2015-01-07 DIAGNOSIS — M25521 Pain in right elbow: Principal | ICD-10-CM

## 2015-01-07 DIAGNOSIS — S42001A Fracture of unspecified part of right clavicle, initial encounter for closed fracture: Secondary | ICD-10-CM | POA: Insufficient documentation

## 2015-01-07 DIAGNOSIS — S2231XA Fracture of one rib, right side, initial encounter for closed fracture: Secondary | ICD-10-CM | POA: Insufficient documentation

## 2015-01-07 DIAGNOSIS — S2231XD Fracture of one rib, right side, subsequent encounter for fracture with routine healing: Secondary | ICD-10-CM

## 2015-01-07 DIAGNOSIS — S2241XD Multiple fractures of ribs, right side, subsequent encounter for fracture with routine healing: Secondary | ICD-10-CM | POA: Insufficient documentation

## 2015-01-07 DIAGNOSIS — S270XXD Traumatic pneumothorax, subsequent encounter: Secondary | ICD-10-CM

## 2015-01-07 DIAGNOSIS — S42001D Fracture of unspecified part of right clavicle, subsequent encounter for fracture with routine healing: Secondary | ICD-10-CM
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
## 2015-01-07 NOTE — Telephone Encounter (Signed)
Patient saw Dr Tonita Cong today. Also had an xray of his elbow. He would like those results if they are available. Please call and advise. Thanks

## 2015-01-07 NOTE — Patient Instructions (Signed)
If your pain gets worse, please give [place] a call. If your x-ray shows that you have a fracture, we will send you to the ED and be seen by an Orthopedic. Please purchase a sling to put on your right arm.  Physical Therapy will be contacting you to set-up your appointment.

## 2015-01-09 NOTE — Telephone Encounter (Signed)
Returned phone call to patient. Explained the following to the patient:  Elbow X-ray is negative for bone abnormality but did show soft tissue swelling.  PT referral has been sent. Pt states that he does not have insurance to go.  Explained that he should elevate, apply ice 3-4 times daily for 15-20 minutes each time with a barrier between ice and skin,  And Ibuprofen-  TID x 2 weeks.   Pt verbalized understanding.

## 2015-02-11 ENCOUNTER — Encounter: Payer: Self-pay | Admitting: General Surgery

## 2015-02-11 ENCOUNTER — Ambulatory Visit (INDEPENDENT_AMBULATORY_CARE_PROVIDER_SITE_OTHER): Payer: Self-pay | Admitting: General Surgery

## 2015-02-11 VITALS — BP 173/99 | HR 102 | Temp 98.5°F | Ht 71.0 in | Wt 161.0 lb

## 2015-02-11 DIAGNOSIS — Z5189 Encounter for other specified aftercare: Secondary | ICD-10-CM

## 2015-02-11 NOTE — Patient Instructions (Signed)
Please see disability note provided.
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
## 2021-07-28 ENCOUNTER — Emergency Department: Payer: Self-pay

## 2021-07-28 ENCOUNTER — Observation Stay: Payer: Self-pay

## 2021-07-28 ENCOUNTER — Other Ambulatory Visit: Payer: Self-pay

## 2021-07-28 ENCOUNTER — Observation Stay
Admission: EM | Admit: 2021-07-28 | Discharge: 2021-07-29 | Disposition: A | Discharge status: Home / Self Care | Payer: Self-pay | Attending: Internal Medicine | Admitting: Internal Medicine

## 2021-07-28 ENCOUNTER — Observation Stay (HOSPITAL_BASED_OUTPATIENT_CLINIC_OR_DEPARTMENT_OTHER)
Admit: 2021-07-28 | Discharge: 2021-07-28 | Disposition: A | Discharge status: Home / Self Care | Payer: Self-pay | Attending: Neurology | Admitting: Neurology

## 2021-07-28 DIAGNOSIS — F1010 Alcohol abuse, uncomplicated: Secondary | ICD-10-CM

## 2021-07-28 DIAGNOSIS — N179 Acute kidney failure, unspecified: Secondary | ICD-10-CM | POA: Diagnosis present

## 2021-07-28 DIAGNOSIS — R55 Syncope and collapse: Secondary | ICD-10-CM

## 2021-07-28 DIAGNOSIS — Z20822 Contact with and (suspected) exposure to covid-19: Secondary | ICD-10-CM | POA: Insufficient documentation

## 2021-07-28 DIAGNOSIS — D539 Nutritional anemia, unspecified: Secondary | ICD-10-CM | POA: Diagnosis present

## 2021-07-28 DIAGNOSIS — F17210 Nicotine dependence, cigarettes, uncomplicated: Secondary | ICD-10-CM | POA: Insufficient documentation

## 2021-07-28 DIAGNOSIS — F17200 Nicotine dependence, unspecified, uncomplicated: Secondary | ICD-10-CM | POA: Diagnosis present

## 2021-07-28 DIAGNOSIS — I10 Essential (primary) hypertension: Secondary | ICD-10-CM | POA: Insufficient documentation

## 2021-07-28 DIAGNOSIS — F1020 Alcohol dependence, uncomplicated: Secondary | ICD-10-CM | POA: Diagnosis present

## 2021-07-28 DIAGNOSIS — F10939 Alcohol use, unspecified with withdrawal, unspecified: Secondary | ICD-10-CM | POA: Diagnosis present

## 2021-07-28 DIAGNOSIS — Z79899 Other long term (current) drug therapy: Secondary | ICD-10-CM | POA: Insufficient documentation

## 2021-07-28 DIAGNOSIS — R569 Unspecified convulsions: Principal | ICD-10-CM

## 2021-07-28 LAB — ETHANOL: Alcohol, Ethyl (B): <10 mg/dL (ref <10)

## 2021-07-28 LAB — HEPATIC FUNCTION PANEL
ALT: 58 U/L — ABNORMAL HIGH (ref 0–44)
AST: 113 U/L — ABNORMAL HIGH (ref 15–41)
Albumin: 3.9 g/dL (ref 3.5–5.0)
Alkaline Phosphatase: 73 U/L (ref 38–126)
Bilirubin, Direct: 0.8 mg/dL — ABNORMAL HIGH (ref 0.0–0.2)
Indirect Bilirubin: 1.6 mg/dL — ABNORMAL HIGH (ref 0.3–0.9)
Total Bilirubin: 2.4 mg/dL — ABNORMAL HIGH (ref 0.3–1.2)
Total Protein: 7.3 g/dL (ref 6.5–8.1)

## 2021-07-28 LAB — CBC
HCT: 31.5 % — ABNORMAL LOW (ref 39.0–52.0)
Hemoglobin: 10.4 g/dL — ABNORMAL LOW (ref 13.0–17.0)
MCH: 33.8 pg (ref 26.0–34.0)
MCHC: 33 g/dL (ref 30.0–36.0)
MCV: 102.3 fL — ABNORMAL HIGH (ref 80.0–100.0)
Platelets: 167 10*3/uL (ref 150–400)
RBC: 3.08 MIL/uL — ABNORMAL LOW (ref 4.22–5.81)
RDW: 13.2 % (ref 11.5–15.5)
WBC: 5.4 10*3/uL (ref 4.0–10.5)
nRBC: 0 % (ref 0.0–0.2)

## 2021-07-28 LAB — ECHOCARDIOGRAM COMPLETE BUBBLE STUDY
AR max vel: 3 cm2
AV Area VTI: 2.61 cm2
AV Area mean vel: 2.81 cm2
AV Mean grad: 3.5 mmHg
AV Peak grad: 6.7 mmHg
Ao pk vel: 1.29 m/s
Area-P 1/2: 3.16 cm2
MV VTI: 2.5 cm2
S' Lateral: 2.6 cm

## 2021-07-28 LAB — FOLATE: Folate: 8.8 ng/mL (ref >5.9)

## 2021-07-28 LAB — BASIC METABOLIC PANEL
Anion gap: 18 — ABNORMAL HIGH (ref 5–15)
BUN: 10 mg/dL (ref 6–20)
CO2: 16 mmol/L — ABNORMAL LOW (ref 22–32)
Calcium: 9.3 mg/dL (ref 8.9–10.3)
Chloride: 98 mmol/L (ref 98–111)
Creatinine, Ser: 1.43 mg/dL — ABNORMAL HIGH (ref 0.61–1.24)
GFR, Estimated: 57 mL/min — ABNORMAL LOW (ref >60)
Glucose, Bld: 69 mg/dL — ABNORMAL LOW (ref 70–99)
Potassium: 3.8 mmol/L (ref 3.5–5.1)
Sodium: 132 mmol/L — ABNORMAL LOW (ref 135–145)

## 2021-07-28 LAB — RESP PANEL BY RT-PCR (FLU A&B, COVID) ARPGX2
Influenza A by PCR: NEGATIVE
Influenza B by PCR: NEGATIVE
SARS Coronavirus 2 by RT PCR: NEGATIVE

## 2021-07-28 LAB — CK: Total CK: 189 U/L (ref 49–397)

## 2021-07-28 LAB — MAGNESIUM: Magnesium: 2.1 mg/dL (ref 1.7–2.4)

## 2021-07-28 LAB — CBG MONITORING, ED: Glucose-Capillary: 71 mg/dL (ref 70–99)

## 2021-07-28 LAB — VITAMIN B12: Vitamin B-12: 550 pg/mL (ref 180–914)

## 2021-07-28 MED ORDER — LORAZEPAM 1 MG PO TABS
1.0000 mg | ORAL_TABLET | ORAL | Status: DC | PRN
  Administered 2021-07-28 (×2): 1 mg via ORAL
  Filled 2021-07-28 (×2): qty 1

## 2021-07-28 MED ORDER — GADOBUTROL 1 MMOL/ML IV SOLN
7.5000 mL | Freq: Once | INTRAVENOUS | Status: AC | PRN
  Administered 2021-07-28: 7.5 mL via INTRAVENOUS

## 2021-07-28 MED ORDER — LEVETIRACETAM IN NACL 1500 MG/100ML IV SOLN
1500.0000 mg | Freq: Once | INTRAVENOUS | Status: AC
  Administered 2021-07-28: 1500 mg via INTRAVENOUS
  Filled 2021-07-28: qty 100

## 2021-07-28 MED ORDER — SODIUM CHLORIDE 0.9% FLUSH
3.0000 mL | Freq: Two times a day (BID) | INTRAVENOUS | Status: DC
  Administered 2021-07-29: 3 mL via INTRAVENOUS

## 2021-07-28 MED ORDER — THIAMINE HCL 100 MG/ML IJ SOLN: 100.0000 mg | Freq: Every day | INTRAMUSCULAR | Status: DC

## 2021-07-28 MED ORDER — LORAZEPAM 2 MG/ML IJ SOLN: 4.0000 mg | INTRAMUSCULAR | Status: DC | PRN

## 2021-07-28 MED ORDER — ONDANSETRON HCL 4 MG/2ML IJ SOLN: 4.0000 mg | Freq: Four times a day (QID) | INTRAMUSCULAR | Status: DC | PRN

## 2021-07-28 MED ORDER — THIAMINE HCL 100 MG PO TABS
100.0000 mg | ORAL_TABLET | Freq: Every day | ORAL | Status: DC
  Administered 2021-07-28 – 2021-07-29 (×2): 100 mg via ORAL
  Filled 2021-07-28 (×2): qty 1

## 2021-07-28 MED ORDER — LORAZEPAM 2 MG/ML IJ SOLN: 1.0000 mg | INTRAMUSCULAR | Status: DC | PRN

## 2021-07-28 MED ORDER — ACETAMINOPHEN 325 MG PO TABS: 650.0000 mg | ORAL_TABLET | ORAL | Status: DC | PRN

## 2021-07-28 MED ORDER — SODIUM CHLORIDE 0.9 % IV SOLN: INTRAVENOUS | Status: DC

## 2021-07-28 MED ORDER — FOLIC ACID 1 MG PO TABS
1.0000 mg | ORAL_TABLET | Freq: Every day | ORAL | Status: DC
  Administered 2021-07-28 – 2021-07-29 (×2): 1 mg via ORAL
  Filled 2021-07-28 (×2): qty 1

## 2021-07-28 MED ORDER — SODIUM CHLORIDE 0.9 % IV BOLUS
500.0000 mL | Freq: Once | INTRAVENOUS | Status: AC
  Administered 2021-07-28: 500 mL via INTRAVENOUS

## 2021-07-28 MED ORDER — ONDANSETRON HCL 4 MG PO TABS: 4.0000 mg | ORAL_TABLET | Freq: Four times a day (QID) | ORAL | Status: DC | PRN

## 2021-07-28 MED ORDER — ORAL CARE MOUTH RINSE
15.0000 mL | OROMUCOSAL | Status: DC
  Filled 2021-07-28 (×5): qty 15

## 2021-07-28 MED ORDER — ADULT MULTIVITAMIN W/MINERALS CH
1.0000 | ORAL_TABLET | Freq: Every day | ORAL | Status: DC
  Administered 2021-07-28 – 2021-07-29 (×2): 1 via ORAL
  Filled 2021-07-28 (×2): qty 1

## 2021-07-28 MED ORDER — ACETAMINOPHEN 650 MG RE SUPP: 650.0000 mg | RECTAL | Status: DC | PRN

## 2021-07-28 MED ORDER — LEVETIRACETAM 500 MG PO TABS
500.0000 mg | ORAL_TABLET | Freq: Two times a day (BID) | ORAL | Status: DC
  Administered 2021-07-29: 500 mg via ORAL
  Filled 2021-07-28: qty 1

## 2021-07-28 MED ORDER — CHLORHEXIDINE GLUCONATE 0.12% ORAL RINSE (MEDLINE KIT)
15.0000 mL | Freq: Two times a day (BID) | OROMUCOSAL | Status: DC
  Filled 2021-07-28 (×2): qty 15

## 2021-07-28 NOTE — Progress Notes (Signed)
Admission profile updated. ?

## 2021-07-28 NOTE — Progress Notes (Signed)
*  PRELIMINARY RESULTS* ?Echocardiogram ?2D Echocardiogram has been performed. ? ?Matthew [name], [name] ?07/28/2021, 1:55 PM ?

## 2021-07-28 NOTE — Progress Notes (Signed)
Eeg done 

## 2021-07-28 NOTE — Assessment & Plan Note (Signed)
Patient has a history of severe alcohol use disorder and admits to drinking 12 to 15 cans of beer daily. ?He has cut back on his drinking and states that he only drinks 4 cans of beer now ?He has transaminitis consistent with alcohol dependence ?He has symptoms of alcohol withdrawal when he does not drink ?Patient has been counseled on the need to abstain from further alcohol use ?

## 2021-07-28 NOTE — ED Notes (Signed)
Pt. Speaking with MRI on corless phone ?

## 2021-07-28 NOTE — Assessment & Plan Note (Addendum)
Secondary to alcohol dependence ?-B12 level 550 ?- Folate level 8.8 ?

## 2021-07-28 NOTE — ED Notes (Signed)
Pt eating tray at bedside

## 2021-07-28 NOTE — Consult Note (Signed)
NEUROLOGY CONSULTATION NOTE  ? ?Date of service: July 28, 2021 ?Patient Name: Matthew [name] ?MRN:  [id number] ?DOB:  12/13/1960 ?Reason for consult: first-time seizure ?Requesting physician: Dr. Royce Macadamia Agbata ?_ _ _   _ __   _ __ _ _  __ __   _ __   __ _ ? ?History of Present Illness  ? ?This is a 61 year old gentleman with past medical history significant for alcohol and nicotine dependence and hypertension who is brought into the ER by EMS after he had a witnessed seizure while at work.  He had a longstanding daily alcohol use but is recently been cutting back.  He typically would drink 12 to 15 cans of beer a day but has cut back to 4 cans daily.  Last drink was yesterday morning.  He has had symptoms of alcohol withdrawal specifically tremulousness when he stopped drinking in the past.  He has no known history of withdrawal seizures.  This morning he went to work in his usual state of health and then reports that he suddenly felt like he was in a dream.  The next thing he knows he woke up and was surrounded by EMS.  His coworkers described convulsive activity and EMS reported when they arrived on scene patient was disoriented and lethargic.  He is currently back to his mental status baseline and has no neurologic deficits. ? ?Upon further questioning patient reports that he has had a few spells in the past where he felt like he was in a dream.  He is unable to describe this much more specifically except that he had a brief period of altered perception.  He also reports that he has had at least 2 episodes of passing out in the past but is not sure if these were preceded by the dreamlike perception disturbance. ? ?Head CT without contrast in the ED was unremarkable (personal review). ? ?Labs ?Ethanol - undetectable ?Creatinine 1.43 (no recent baseline) ?ALT 58 ?AST 113 ?  ?ROS  ? ?Per HPI: all other systems reviewed and are negative ? ?Past History  ? ?I have reviewed the following: ? ?Past Medical History:   ?Diagnosis Date  ? Hypertension   ? Pneumothorax 12/2014  ? ?Past Surgical History:  ?Procedure Laterality Date  ? APPENDECTOMY    ? ?Family History  ?Problem Relation Age of Onset  ? Diabetes Maternal Grandmother   ? ?Social History  ? ?Socioeconomic History  ? Marital status: Divorced  ?  Spouse name: Not on file  ? Number of children: Not on file  ? Years of education: Not on file  ? Highest education level: Not on file  ?Occupational History  ? Not on file  ?Tobacco Use  ? Smoking status: Every Day  ?  Packs/day: 0.50  ?  Years: 35.00  ?  Pack years: 17.50  ?  Types: Cigarettes  ? Smokeless tobacco: Never  ?Substance and Sexual Activity  ? Alcohol use: Yes  ?  Comment: 80 oz beer daily, 1/2 pint liquor daily  ? Drug use: No  ? Sexual activity: Not Currently  ?  Birth control/protection: None  ?Other Topics Concern  ? Not on file  ?Social History Narrative  ? Not on file  ? ?Social Determinants of Health  ? ?Financial Resource Strain: Not on file  ?Food Insecurity: Not on file  ?Transportation Needs: Not on file  ?Physical Activity: Not on file  ?Stress: Not on file  ?Social Connections: Not on file  ? ?  No Known Allergies ? ?Medications  ? ?No medications prior to admission.  ?  ? ? ?Current Facility-Administered Medications:  ?  0.9 %  sodium chloride infusion, , Intravenous, Continuous, Agbata, Tochukwu, MD, Last Rate: 100 mL/hr at 07/28/21 1304, New Bag at 07/28/21 1304 ?  acetaminophen (TYLENOL) tablet 650 mg, 650 mg, Oral, Q4H PRN **OR** acetaminophen (TYLENOL) suppository 650 mg, 650 mg, Rectal, Q4H PRN, Agbata, Tochukwu, MD ?  chlorhexidine gluconate (MEDLINE KIT) (PERIDEX) 0.12 % solution 15 mL, 15 mL, Mouth Rinse, BID, Agbata, Tochukwu, MD ?  folic acid (FOLVITE) tablet 1 mg, 1 mg, Oral, Daily, Agbata, Tochukwu, MD, 1 mg at 07/28/21 0744 ?  gadobutrol (GADAVIST) 1 MMOL/ML injection 7.5 mL, 7.5 mL, Intravenous, Once PRN, Derek Jack, MD ?  LORazepam (ATIVAN) tablet 1-4 mg, 1-4 mg, Oral, Q1H PRN, 1 mg  at 07/28/21 0744 **OR** LORazepam (ATIVAN) injection 1-4 mg, 1-4 mg, Intravenous, Q1H PRN, Agbata, Tochukwu, MD ?  LORazepam (ATIVAN) injection 4 mg, 4 mg, Intravenous, Q5 Min x 2 PRN, Agbata, Tochukwu, MD ?  MEDLINE mouth rinse, 15 mL, Mouth Rinse, 10 times per day, Agbata, Tochukwu, MD ?  multivitamin with minerals tablet 1 tablet, 1 tablet, Oral, Daily, Agbata, Tochukwu, MD, 1 tablet at 07/28/21 0744 ?  ondansetron (ZOFRAN) tablet 4 mg, 4 mg, Oral, Q6H PRN **OR** ondansetron (ZOFRAN) injection 4 mg, 4 mg, Intravenous, Q6H PRN, Agbata, Tochukwu, MD ?  sodium chloride flush (NS) 0.9 % injection 3 mL, 3 mL, Intravenous, Q12H, Agbata, Tochukwu, MD ?  thiamine tablet 100 mg, 100 mg, Oral, Daily, 100 mg at 07/28/21 0743 **OR** thiamine (B-1) injection 100 mg, 100 mg, Intravenous, Daily, Agbata, Tochukwu, MD ? ?Vitals  ? ?Vitals:  ? 07/28/21 0800 07/28/21 0830 07/28/21 1149 07/28/21 1230  ?BP: 118/74 120/70 (!) 152/90 130/82  ?Pulse: 92 91 87 86  ?Resp: (!) '22 17 14 18  ' ?Temp:   98.1 ?F (36.7 ?C)   ?TempSrc:   Oral   ?SpO2: 99% 97% 98% 99%  ?Weight:      ?  ? ?Body mass index is 23.58 kg/m?. ? ?Physical Exam  ? ?Physical Exam ?Gen: A&O x4, NAD ?HEENT: Atraumatic, normocephalic;mucous membranes moist; oropharynx clear, tongue without atrophy or fasciculations. ?Neck: Supple, trachea midline. ?Resp: CTAB, no w/r/r ?CV: RRR, no m/g/r; nml S1 and S2. 2+ symmetric peripheral pulses. ?Abd: soft/NT/ND; nabs x 4 quad ?Extrem: Nml bulk; no cyanosis, clubbing, or edema. ? ?Neuro: ?*MS: A&O x4. Follows multi-step commands.  ?*Speech: fluid, nondysarthric, able to name and repeat ?*CN:  ?  I: Deferred ?  II,III: PERRLA, VFF by confrontation, optic discs unable to be visualized 2/2 pupillary constriction ?  III,IV,VI: EOMI w/o nystagmus, no ptosis ?  V: Sensation intact from V1 to V3 to LT ?  VII: Eyelid closure was full.  Smile symmetric. ?  VIII: Hearing intact to voice ?  IX,X: Voice normal, palate elevates symmetrically  ?  XI:  SCM/trap 5/5 bilat   ?XII: Tongue protrudes midline, no atrophy or fasciculations  ? ?*Motor:   Normal bulk.  No tremor, rigidity or bradykinesia. No pronator drift. ? ?  Strength: Dlt Bic Tri WrE WrF FgS Gr HF KnF KnE PlF DoF  ?  Left '5 5 5 5 5 5 5 5 5 5 5 5  ' ?  Right '5 5 5 5 5 5 5 5 5 5 5 5  ' ? ? ?*Sensory: Intact to light touch, pinprick, temperature vibration throughout. Symmetric. Propioception intact bilat.  No double-simultaneous extinction.  ?*Coordination:  Finger-to-nose, heel-to-shin, rapid alternating motions were intact. ?*Reflexes:  2+ and symmetric throughout without clonus; toes down-going bilat ?*Gait: deferred ? ?NIHSS = 0 ? ? ?Labs  ? ?CBC:  ?Recent Labs  ?Lab 07/28/21 ?0723  ?WBC 5.4  ?HGB 10.4*  ?HCT 31.5*  ?MCV 102.3*  ?PLT 167  ? ? ?Basic Metabolic Panel:  ?Lab Results  ?Component Value Date  ? NA 132 (L) 07/28/2021  ? K 3.8 07/28/2021  ? CO2 16 (L) 07/28/2021  ? GLUCOSE 69 (L) 07/28/2021  ? BUN 10 07/28/2021  ? CREATININE 1.43 (H) 07/28/2021  ? CALCIUM 9.3 07/28/2021  ? GFRNONAA 57 (L) 07/28/2021  ? GFRAA >60 12/23/2014  ? ?Lipid Panel: No results found for: [place] ?HgbA1c:  ?Lab Results  ?Component Value Date  ? HGBA1C 4.4 12/22/2014  ? ?Urine Drug Screen:  ?   ?Component Value Date/Time  ? LABOPIA NONE DETECTED 10/23/2014 1128  ? COCAINSCRNUR NONE DETECTED 10/23/2014 1128  ? LABBENZ NONE DETECTED 10/23/2014 1128  ? AMPHETMU NONE DETECTED 10/23/2014 1128  ? THCU NONE DETECTED 10/23/2014 1128  ? LABBARB NONE DETECTED 10/23/2014 1128  ?  ?Alcohol Level  ?   ?Component Value Date/Time  ? ETH <10 07/28/2021 0723  ? ? ? ?Impression  ? ?This is a 61 year old gentleman with past medical history significant for alcohol and nicotine dependence and hypertension who is brought into the ER by EMS after he had a witnessed seizure while at work of EtOH withdrawal. It was preceded by an alteration of perception / dreamlike state which he has had multiple times in the past, not necessarily in the context of  EtOH withdrawal, that are concerning for prior focal seizures. As such I would recommend starting him on keppra 556m bid particularly given that he is at high risk for seizures in the acute withdrawal pe

## 2021-07-28 NOTE — ED Notes (Signed)
Seizure pads in place

## 2021-07-28 NOTE — Assessment & Plan Note (Addendum)
Patient noted to have an AKI ?Baseline serum creatinine is 0.69 on admission it was 1.43 ?-Presumed prerenal ?- Responded to fluids and creatinine normal at discharge ?

## 2021-07-28 NOTE — Assessment & Plan Note (Addendum)
-   Minimal scoring on CIWA ?- Only subtle hand tremor at discharge ?

## 2021-07-28 NOTE — ED Notes (Signed)
Pt in CT.

## 2021-07-28 NOTE — ED Triage Notes (Signed)
Pt to ED [facility] from work/[employer] for witnessed seizure. States recently stopped ETOH to start job 2 weeks ago. Ems reports postictal. No meds per ems ?18g left [facility].  ?Pt answering orientations questions appropriately.  ?Last ETOH yesterday am.  ?

## 2021-07-28 NOTE — ED Notes (Signed)
Pt provided tv remote as requested ?

## 2021-07-28 NOTE — ED Notes (Signed)
Pt provided juice.

## 2021-07-28 NOTE — Assessment & Plan Note (Addendum)
-   Differential includes alcohol withdrawal versus new onset seizure disorder ?-Strongly encouraged patient to completely abstain from any further alcohol use as this is still highly suspicious for alcohol withdrawal ?-Appreciate neurology evaluation.  EEG unremarkable ?- Loaded with Keppra during hospitalization and discharged with Keppra 500 mg twice daily and outpatient neurology follow-up ?

## 2021-07-28 NOTE — H&P (Addendum)
?History and Physical  ? ? ?Patient: Matthew [name] [id number] DOB: 10-25-1960 ?DOA: 07/28/2021 ?DOS: the patient was seen and examined on 07/28/2021 ?PCP: Patient, No Pcp Per (Inactive)  ?Patient coming from: Home ? ?Chief Complaint:  ?Chief Complaint  ?Patient presents with  ? Seizures  ? ?HPI: Matthew [name] is a 61 y.o. male with medical history significant for alcohol and nicotine dependence, hypertension who presents to the ER via EMS after he had a witnessed seizure while at work. ?Patient states that he works at [employer] and had gone to work in his usual state of health.  He states that he suddenly felt like he was in a dream and when he woke up he was surrounded by EMS.  His coworkers describe seizure-like activity and when EMS arrived patient was postictal.  He is currently awake, alert and oriented to person place and time and is able to provide history. ?He admits to daily alcohol use but states that he has been cutting back.  He usually drinks 12 to 15 cans of beer a day but states that he has cut back to 4 cans daily.  He admits to having symptoms of alcohol withdrawal when he does not drink.  He does not have a known history of seizures.   ?He denies having any headaches, no dizziness, no lightheadedness, no nausea, no abdominal pain, no chest pain, no shortness of breath, no fever or chills. ?He received a dose of Ativan in the ER and  will be referred to observation status. ?Review of Systems: As mentioned in the history of present illness. All other systems reviewed and are negative. ?Past Medical History:  ?Diagnosis Date  ? Hypertension   ? Pneumothorax 12/2014  ? ?Past Surgical History:  ?Procedure Laterality Date  ? APPENDECTOMY    ? ?Social History:  reports that he has been smoking. He has a 17.50 pack-year smoking history. He has never used smokeless tobacco. He reports current alcohol use. He reports that he does not use drugs. ? ?No Known Allergies ? ?Family History  ?Problem Relation  Age of Onset  ? Diabetes Maternal Grandmother   ? ? ?Prior to Admission medications   ?Medication Sig Start Date End Date Taking? Authorizing Provider  ?acetaminophen (TYLENOL) 500 MG tablet Take 500 mg by mouth every 6 (six) hours as needed.    [provider]  ? ? ?Physical Exam: ?Vitals:  ? 07/28/21 0726 07/28/21 0800 07/28/21 0830 07/28/21 1149  ?BP: 135/89 118/74 120/70 (!) 152/90  ?Pulse: (!) 104 92 91 87  ?Resp:  (!) 22 17 14   ?Temp:    98.1 ?F (36.7 ?C)  ?TempSrc:    Oral  ?SpO2:  99% 97% 98%  ?Weight:      ? ?Physical Exam ?Constitutional:   ?   Appearance: Normal appearance. He is normal weight.  ?HENT:  ?   Head: Normocephalic and atraumatic.  ?   Nose: Nose normal.  ?   Mouth/Throat:  ?   Mouth: Mucous membranes are moist.  ?Eyes:  ?   Pupils: Pupils are equal, round, and reactive to light.  ?Cardiovascular:  ?   Rate and Rhythm: Tachycardia present.  ?Pulmonary:  ?   Effort: Pulmonary effort is normal.  ?   Breath sounds: Normal breath sounds.  ?Abdominal:  ?   General: Abdomen is flat. Bowel sounds are normal.  ?   Palpations: Abdomen is soft.  ?Musculoskeletal:     ?   General: Normal range  of motion.  ?   Cervical back: Normal range of motion and neck supple.  ?Skin: ?   General: Skin is warm and dry.  ?Neurological:  ?   General: No focal deficit present.  ?   Mental Status: He is alert and oriented to person, place, and time.  ?Psychiatric:     ?   Mood and Affect: Mood normal.     ?   Behavior: Behavior normal.  ? ? ?Data Reviewed: ?Relevant notes from primary care and specialist visits, past discharge summaries as available in EHR, including Care Everywhere. ?Prior diagnostic testing as pertinent to current admission diagnoses ?Updated medications and problem lists for reconciliation ?ED course, including vitals, labs, imaging, treatment and response to treatment ?Triage notes, nursing and pharmacy notes and ED provider's notes ?Notable results as noted in HPI ?Labs reviewed.  AST 113,  ALT 58, total bilirubin 2.4, direct bilirubin 0.8, magnesium 2.1, sodium 132, bicarb 16, glucose 69, serum creatinine 1.43 above a baseline of 0.69, white count 5.4, hemoglobin 10.4, MCV 102.3, RDW 13.2, platelet count 167 ?CT scan of the head shows no evidence of acute intracranial abnormality. ?Twelve-lead EKG reviewed by me shows sinus tachycardia ?There are no new results to review at this time. ? ?Assessment and Plan: ?* Seizure ([place]) ?New onset and most likely secondary to alcohol withdrawal ?Administer lorazepam as needed for seizure ?Place patient on seizure precautions ?Neurology consult ?Obtain EEG ? ?AKI (acute kidney injury) ([place]) ?Patient noted to have an AKI ?Baseline serum creatinine is 0.69 on admission it was 1.43 ?Obtain CK levels ?IV fluid hydration ?Repeat renal parameters in a.m. ? ?Macrocytic anemia ?Secondary to alcohol dependence ?Check folic acid and vitamin B12 levels ?Continue supplementation of folic acid ? ?Tobacco use disorder ?Smoking cessation has been discussed with patient in detail ?He declines a nicotine transdermal patch at this time ? ?Alcohol use disorder, severe, dependence ([place]) ?Patient has a history of severe alcohol use disorder and admits to drinking 12 to 15 cans of beer daily. ?He has cut back on his drinking and states that he only drinks 4 cans of beer now ?He has transaminitis consistent with alcohol dependence ?He has symptoms of alcohol withdrawal when he does not drink ?Patient has been counseled on the need to abstain from further alcohol use ? ?Alcohol withdrawal ([place]) ?Patient admits to having symptoms of alcohol withdrawal when he does not drink and currently has tremors  ?Placed on CIWA protocol and administer lorazepam for CIWA score of 8 or greater ?Place patient on MVI, folic acid and thiamine ? ? ? ? ? Advance Care Planning:   Code Status: Full Code  ? ?Consults: Neurology ? ?Family Communication: Greater than 50% of time was spent discussing patient's  condition and plan of care with him at the bedside.  All questions and concerns have been addressed.  He verbalized understanding and agrees with the plan. ? ?Severity of Illness: ?The appropriate patient status for this patient is OBSERVATION. Observation status is judged to be reasonable and necessary in order to provide the required intensity of service to ensure the patient's safety. The patient's presenting symptoms, physical exam findings, and initial radiographic and laboratory data in the context of their medical condition is felt to place them at decreased risk for further clinical deterioration. Furthermore, it is anticipated that the patient will be medically stable for discharge from the hospital within 2 midnights of admission.  ? ?Author: ?Collier Bullock, MD ?07/28/2021 12:22 PM ? ?For on call  review [email or web address].  ?

## 2021-07-28 NOTE — ED Provider Notes (Signed)
? ?[facility] ?Provider Note ? ? ? None  ?  (approximate) ? ? ?History  ? ?Seizures ? ? ?HPI ? ?Matthew [name] is a 61 y.o. male history of heavy alcohol abuse ? ?Patient was at work, reports he started to "feel like a dream" lasted briefly and then recalls awakening in the care of EMS. ? ?Coworkers report the patient had a seizure-like event.  EMS reports that he appeared to be confused postictal with improving mental status during the transport.  He was able to tell them that he uses alcohol, and has been recently trying to cut back on his own over the last 2 weeks as he has a new job.  Patient reports his last alcohol use was yesterday morning.  He does feel slightly tremulous. ? ?Denies that he bit his tongue or became injured.  Did not urinate on himself.  Denies history of seizure disorder ? ?Himself reports tapering alcohol use, down to about 2 beers last yesterday morning ? ?No recent illness no fevers or chills.  Felt normal at work took his normal work breaks. ? ?No chest pain no shortness of breath.  No trouble breathing.  No weakness no headache no neck pain ?  ? ? ?Physical Exam  ? ?Triage Vital Signs: ?ED Triage Vitals  ?Enc Vitals Group  ?   BP 07/28/21 0721 135/89  ?   Pulse Rate 07/28/21 0722 (!) 104  ?   Resp 07/28/21 0722 18  ?   Temp 07/28/21 0723 98.2 ?F (36.8 ?C)  ?   Temp src --   ?   SpO2 07/28/21 0721 100 %  ?   Weight 07/28/21 0719 169 lb 1.5 oz (76.7 kg)  ?   Height --   ?   Head Circumference --   ?   Peak Flow --   ?   Pain Score 07/28/21 0719 0  ?   Pain Loc --   ?   Pain Edu? --   ?   Excl. in [place]? --   ? ? ?Most recent vital signs: ?Vitals:  ? 07/28/21 0800 07/28/21 0830  ?BP: 118/74 120/70  ?Pulse: 92 91  ?Resp: (!) 22 17  ?Temp:    ?SpO2: 99% 97%  ? ? ? ?General: Awake, no distress.  Pleasant.  Slightly tremulous in his hands. ?CV:  Good peripheral perfusion.  Normal heart tones, slight tachycardia ?Resp:  Normal effort.  Clear lung sounds bilaterally.  Normal  work of breathing ?Abd:  No distention.  ?Other:  No lower extremity edema bilateral. ? ? ? ? ?ED Results / Procedures / Treatments  ? ?Labs ?(all labs ordered are listed, but only abnormal results are displayed) ?Labs Reviewed  ?CBC - Abnormal; Notable for the following components:  ?    Result Value  ? RBC 3.08 (*)   ? Hemoglobin 10.4 (*)   ? HCT 31.5 (*)   ? MCV 102.3 (*)   ? All other components within normal limits  ?BASIC METABOLIC PANEL - Abnormal; Notable for the following components:  ? Sodium 132 (*)   ? CO2 16 (*)   ? Glucose, Bld 69 (*)   ? Creatinine, Ser 1.43 (*)   ? GFR, Estimated 57 (*)   ? Anion gap 18 (*)   ? All other components within normal limits  ?HEPATIC FUNCTION PANEL - Abnormal; Notable for the following components:  ? AST 113 (*)   ? ALT 58 (*)   ?  Total Bilirubin 2.4 (*)   ? Bilirubin, Direct 0.8 (*)   ? Indirect Bilirubin 1.6 (*)   ? All other components within normal limits  ?RESP PANEL BY RT-PCR (FLU A&B, COVID) ARPGX2  ?ETHANOL  ?MAGNESIUM  ?HIV ANTIBODY (ROUTINE TESTING W REFLEX)  ?FOLATE  ?VITAMIN B12  ?CBG MONITORING, ED  ? ? ? ?EKG ? ?Is reviewed inter by me at 722 ?Heart rate 100 ?QRS 85 ?QTc 460 ?Sinus tachycardia ? ? ?RADIOLOGY ? ?CT scan of the head reviewed negative for acute finding.  Personally interpreted it for gross abnormality for which I find none ? ? ? ? ?PROCEDURES: ? ?Critical Care performed: No ? ?Procedures ? ? ?MEDICATIONS ORDERED IN ED: ?Medications  ?LORazepam (ATIVAN) tablet 1-4 mg (1 mg Oral Given 07/28/21 0744)  ?  Or  ?LORazepam (ATIVAN) injection 1-4 mg ( Intravenous See Alternative 07/28/21 0744)  ?thiamine tablet 100 mg (100 mg Oral Not Given 07/28/21 0921)  ?  Or  ?thiamine (B-1) injection 100 mg ( Intravenous See Alternative 07/28/21 0921)  ?folic acid (FOLVITE) tablet 1 mg (1 mg Oral Not Given 07/28/21 0924)  ?multivitamin with minerals tablet 1 tablet (1 tablet Oral Not Given 07/28/21 0921)  ?chlorhexidine gluconate (MEDLINE KIT) (PERIDEX) 0.12 % solution  15 mL (15 mLs Mouth Rinse Not Given 07/28/21 1010)  ?MEDLINE mouth rinse (15 mLs Mouth Rinse Not Given 07/28/21 1009)  ?LORazepam (ATIVAN) injection 4 mg (has no administration in time range)  ?acetaminophen (TYLENOL) tablet 650 mg (has no administration in time range)  ?  Or  ?acetaminophen (TYLENOL) suppository 650 mg (has no administration in time range)  ?ondansetron (ZOFRAN) tablet 4 mg (has no administration in time range)  ?  Or  ?ondansetron (ZOFRAN) injection 4 mg (has no administration in time range)  ?sodium chloride flush (NS) 0.9 % injection 3 mL (3 mLs Intravenous Not Given 07/28/21 1009)  ?0.9 %  sodium chloride infusion (has no administration in time range)  ?sodium chloride 0.9 % bolus 500 mL (0 mLs Intravenous Stopped 07/28/21 0919)  ?sodium chloride 0.9 % bolus 500 mL (500 mLs Intravenous New Bag/Given 07/28/21 1009)  ? ? ? ?IMPRESSION / MDM / ASSESSMENT AND PLAN / ED COURSE  ?I reviewed the triage vital signs and the nursing notes. ?             ?               ? ?Differential diagnosis includes, but is not limited to, seizure versus syncopal episode.  Patient denies any symptoms of acute cardiopulmonary abnormality.  Noted to have slight tachycardia but otherwise normal exam associated also with mild tremors in his upper extremities bilaterally.  No focal abnormalities on neurologic exam.  No deficits nothing to suggest stroke or significant central etiology at this time.  Will obtain CT scan of the head, placed consult with neurology.  Treatment via withdrawal protocol for suspected alcohol withdrawal.  No infectious symptoms.  Patient is fully awake alert well-oriented without complaint at this time.  Reassuring clinical exam and emergency department ? ? ?The patient is on the cardiac monitor to evaluate for evidence of arrhythmia and/or significant heart rate changes. ? ?Labs reviewed remarkable for mild transaminitis which appears to be historically true and I suspect fits of pattern of alcohol  abuse.  Additionally has mild anemia.  Normal white count.  He has an elevated anion gap with a reduced CO2, likely driven by recent seizure as labs were drawn at the  time of ED arrival.  Differential could also include etiologies such as alcoholic ketoacidosis, but patient receiving fluid, has taken apple juice here, and discussed with the hospitalist and Dr. Francine Graven will plan to recheck a chemistry after additional fluid bolus has been given and continue to follow-up on ? ?CT head personally reviewed by me  ? ?Clinical Course as of 07/28/21 1018  ?Wed Jul 28, 2021  ?0733 Neurology consult, routine, requested of Dr. Su Monks via messaging system [MQ]  ?  ?Clinical Course User Index ?[MQ] Delman Kitten, MD  ? ?----------------------------------------- ?10:18 AM on 07/28/2021 ?----------------------------------------- ?Patient admitted, consultation placed with Dr. Francine Graven.  After consultation patient will be admitted for further care and work-up ? ?FINAL CLINICAL IMPRESSION(S) / ED DIAGNOSES  ? ?Final diagnoses:  ?Seizure ([place])  ?Alcohol abuse  ? ? ? ?Rx / DC Orders  ? ?ED Discharge Orders   ? ? None  ? ?  ? ? ? ?Note:  This document was prepared using Dragon voice recognition software and may include unintentional dictation errors. ?  Delman Kitten, MD ?07/28/21 1018 ? ?

## 2021-07-28 NOTE — Assessment & Plan Note (Signed)
Smoking cessation has been discussed with patient in detail He declines a nicotine transdermal patch at this time 

## 2021-07-29 LAB — CBC
HCT: 26.7 % — ABNORMAL LOW (ref 39.0–52.0)
Hemoglobin: 8.9 g/dL — ABNORMAL LOW (ref 13.0–17.0)
MCH: 33 pg (ref 26.0–34.0)
MCHC: 33.3 g/dL (ref 30.0–36.0)
MCV: 98.9 fL (ref 80.0–100.0)
Platelets: 150 10*3/uL (ref 150–400)
RBC: 2.7 MIL/uL — ABNORMAL LOW (ref 4.22–5.81)
RDW: 13.2 % (ref 11.5–15.5)
WBC: 3.6 10*3/uL — ABNORMAL LOW (ref 4.0–10.5)
nRBC: 0 % (ref 0.0–0.2)

## 2021-07-29 LAB — BASIC METABOLIC PANEL
Anion gap: 7 (ref 5–15)
BUN: 9 mg/dL (ref 6–20)
CO2: 22 mmol/L (ref 22–32)
Calcium: 8.3 mg/dL — ABNORMAL LOW (ref 8.9–10.3)
Chloride: 106 mmol/L (ref 98–111)
Creatinine, Ser: 0.79 mg/dL (ref 0.61–1.24)
GFR, Estimated: >60 mL/min (ref >60)
Glucose, Bld: 74 mg/dL (ref 70–99)
Potassium: 3.4 mmol/L — ABNORMAL LOW (ref 3.5–5.1)
Sodium: 135 mmol/L (ref 135–145)

## 2021-07-29 LAB — HIV ANTIBODY (ROUTINE TESTING W REFLEX): HIV Screen 4th Generation wRfx: NONREACTIVE

## 2021-07-29 MED ORDER — LEVETIRACETAM 500 MG PO TABS: 500.0000 mg | ORAL_TABLET | Freq: Two times a day (BID) | ORAL | 5 refills | Status: DC

## 2021-07-29 MED ORDER — POTASSIUM CHLORIDE CRYS ER 20 MEQ PO TBCR
40.0000 meq | EXTENDED_RELEASE_TABLET | Freq: Once | ORAL | Status: AC
  Administered 2021-07-29: 40 meq via ORAL
  Filled 2021-07-29: qty 2

## 2021-07-29 NOTE — Discharge Instructions (Signed)
Follow up with neurology. Referral placed at discharge. ? ?Refrain from drinking alcohol any further.  ? ?According to [place] law, not allowed to drive for 6 months after last seizure or spell of altered consciousness. Should not perform any activities that could be dangerous if he had a seizure while doing them, such as climbing ladders, operating heavy machinery, or swimming alone. ?

## 2021-07-29 NOTE — Discharge Summary (Signed)
?Physician Discharge Summary ?  ?Patient: Matthew [name] MRN: [id number] DOB: Oct 15, 1960  ?Admit date:     07/28/2021  ?Discharge date: 07/29/2021  ?Discharge Physician: Lewie ChamberDavid Adalynn Corne  ? ?PCP: Patient, No Pcp Per (Inactive)  ? ?Recommendations at discharge:  ? ? Follow up with neurology ? ?Discharge Diagnoses: ?Principal Problem: ?  Seizure ([facility]) ?Active Problems: ?  Alcohol use disorder, severe, dependence ([facility]) ?  Tobacco use disorder ?  Macrocytic anemia ? ? ? ?Assessment and Plan: ?* Seizure ([facility]) ?- Differential includes alcohol withdrawal versus new onset seizure disorder ?-Strongly encouraged patient to completely abstain from any further alcohol use as this is still highly suspicious for alcohol withdrawal ?-Appreciate neurology evaluation.  EEG unremarkable ?- Loaded with Keppra during hospitalization and discharged with Keppra 500 mg twice daily and outpatient neurology follow-up ? ?Macrocytic anemia ?Secondary to alcohol dependence ?-B12 level 550 ?- Folate level 8.8 ? ?Tobacco use disorder ?Smoking cessation has been discussed with patient in detail ?He declines a nicotine transdermal patch at this time ? ?Alcohol use disorder, severe, dependence ([facility]) ?Patient has a history of severe alcohol use disorder and admits to drinking 12 to 15 cans of beer daily. ?He has cut back on his drinking and states that he only drinks 4 cans of beer now ?He has transaminitis consistent with alcohol dependence ?He has symptoms of alcohol withdrawal when he does not drink ?Patient has been counseled on the need to abstain from further alcohol use ? ?AKI (acute kidney injury) ([facility]CC)-resolved as of 07/29/2021 ?Patient noted to have an AKI ?Baseline serum creatinine is 0.69 on admission it was 1.43 ?-Presumed prerenal ?- Responded to fluids and creatinine normal at discharge ? ?Alcohol withdrawal ([facility]CC)-resolved as of 07/29/2021 ?- Minimal scoring on CIWA ?- Only subtle hand tremor at discharge ? ? ? ?  ? ? ?Consultants:  Neurology ?Procedures performed: EEG, 3/15  ?Disposition: Home ?Diet recommendation:  ?Discharge Diet Orders (From admission, onward)  ? ?  Start     Ordered  ? 07/29/21 0000  Diet general       ? 07/29/21 1025  ? ?  ?  ? ?  ? ?Regular diet ?DISCHARGE MEDICATION: ?Allergies as of 07/29/2021   ?No Known Allergies ?  ? ?  ?Medication List  ?  ? ?TAKE these medications   ? ?levETIRAcetam 500 MG tablet ?Commonly known as: KEPPRA ?Take 1 tablet (500 mg total) by mouth 2 (two) times daily. ?  ? ?  ? ? ?Discharge Exam: ?Ceasar MonsFiled Weights  ? 07/28/21 0719  ?Weight: 76.7 kg  ? ?Physical Exam ?Constitutional:   ?   General: He is not in acute distress. ?   Appearance: Normal appearance.  ?HENT:  ?   Head: Normocephalic and atraumatic.  ?   Mouth/Throat:  ?   Mouth: Mucous membranes are moist.  ?Eyes:  ?   Extraocular Movements: Extraocular movements intact.  ?Cardiovascular:  ?   Rate and Rhythm: Normal rate and regular rhythm.  ?   Heart sounds: Normal heart sounds.  ?Pulmonary:  ?   Effort: Pulmonary effort is normal. No respiratory distress.  ?   Breath sounds: Normal breath sounds. No wheezing.  ?Abdominal:  ?   General: Bowel sounds are normal. There is no distension.  ?   Palpations: Abdomen is soft.  ?   Tenderness: There is no abdominal tenderness.  ?Musculoskeletal:     ?   General: Normal range of motion.  ?   Cervical back: Normal range  of motion and neck supple.  ?Skin: ?   General: Skin is warm and dry.  ?Neurological:  ?   General: No focal deficit present.  ?   Mental Status: He is alert.  ?   Comments: Subtle tremor in hands  ?Psychiatric:     ?   Mood and Affect: Mood normal.     ?   Behavior: Behavior normal.  ? ? ? ?Condition at discharge: stable ? ?The results of significant diagnostics from this hospitalization (including imaging, microbiology, ancillary and laboratory) are listed below for reference.  ? ?Imaging Studies: ?CT Head Wo Contrast ? ?Result Date: 07/28/2021 ?CLINICAL DATA:  Seizure, new-onset, no  history of trauma EXAM: CT HEAD WITHOUT CONTRAST TECHNIQUE: Contiguous axial images were obtained from the base of the skull through the vertex without intravenous contrast. RADIATION DOSE REDUCTION: This exam was performed according to the departmental dose-optimization program which includes automated exposure control, adjustment of the mA and/or kV according to patient size and/or use of iterative reconstruction technique. COMPARISON:  CT head Sep 20, 2010. FINDINGS: Brain: No evidence of acute infarction, hemorrhage, hydrocephalus, extra-axial collection or mass lesion/mass effect. Mild cerebral atrophy. Vascular: No hyperdense vessel identified. Calcific intracranial atherosclerosis. Skull: No acute fracture. Sinuses/Orbits: Visualized sinuses are clear. Remote right medial orbital wall fracture. Other: No mastoid effusions. IMPRESSION: No evidence of acute intracranial abnormality. If the patient's reported seizures continue, consider epilepsy protocol MRI for further evaluation. Electronically Signed   By: Feliberto Harts M.D.   On: 07/28/2021 08:51  ? ?MR ANGIO HEAD WO CONTRAST ? ?Result Date: 07/28/2021 ?CLINICAL DATA:  Seizure, new-onset, no history of trauma; Syncope/presyncope, cerebrovascular cause suspected EXAM: MRI HEAD WITHOUT AND WITH CONTRAST MRA HEAD WITHOUT CONTRAST MRA NECK WITHOUT CONTRAST TECHNIQUE: Multiplanar, multiecho pulse sequences of the brain and surrounding structures were obtained without and with intravenous contrast. Angiographic images of the Circle of Willis were obtained using MRA technique without intravenous contrast. Angiographic images of the neck were obtained using MRA technique without and with intravenous contrast. Carotid stenosis measurements (when applicable) are obtained utilizing NASCET criteria, using the distal internal carotid diameter as the denominator. CONTRAST:  7.46mL GADAVIST GADOBUTROL 1 MMOL/ML IV SOLN COMPARISON:  None. FINDINGS: MRI HEAD Brain: There is  no acute infarction or intracranial hemorrhage. There is no intracranial mass, mass effect, or edema. There is no hydrocephalus or extra-axial fluid collection. A few small foci of T2 hyperintensity in the supratentorial white matter likely reflect minimal nonspecific gliosis/demyelination. Hippocampi are unremarkable. Ventricles and sulci are normal in size and configuration. No abnormal enhancement. Vascular: Major vessel flow voids at the skull base are preserved. Skull and upper cervical spine: Normal marrow signal is preserved. Sinuses/Orbits: Minor mucosal thickening.  Orbits are unremarkable. Other: Sella is unremarkable.  Mastoid air cells are clear. MRA HEAD Intracranial internal carotid arteries are patent. Middle and anterior cerebral arteries are patent. Right intracranial vertebral artery is patent. There is patency of the left intracranial vertebral artery, which could reflect retrograde flow given findings in the neck. Basilar artery is patent. Posterior cerebral arteries are patent. Bilateral posterior communicating arteries are present. There is no significant stenosis or aneurysm. MRA NECK Great vessel origins are patent. Common, internal, and external carotid arteries are patent. Plaque at the right ICA origin causes 50% stenosis. Plaque at the left ICA origin without stenosis. Extracranial right vertebral is patent. No significant flow related enhancement of the extracranial left vertebral artery. IMPRESSION: No acute infarction, hemorrhage, or mass.  No  abnormal enhancement. No flow within the extracranial left vertebral artery, which may be chronically occluded. Flow intracranially may be retrograde. Plaque at the right ICA origin causes about 50% stenosis. Electronically Signed   By: Guadlupe Spanish M.D.   On: 07/28/2021 15:44  ? ?MR ANGIO NECK WO CONTRAST ? ?Result Date: 07/28/2021 ?CLINICAL DATA:  Seizure, new-onset, no history of trauma; Syncope/presyncope, cerebrovascular cause suspected  EXAM: MRI HEAD WITHOUT AND WITH CONTRAST MRA HEAD WITHOUT CONTRAST MRA NECK WITHOUT CONTRAST TECHNIQUE: Multiplanar, multiecho pulse sequences of the brain and surrounding structures were obtained without and with in

## 2021-07-29 NOTE — Procedures (Signed)
Routine EEG Report ? ?Matthew [name] is a 61 y.o. male with a history of seizure who is undergoing an EEG to evaluate for seizures. ? ?Report: This EEG was acquired with electrodes placed according to the International 10-20 electrode system (including Fp1, Fp2, F3, F4, C3, C4, P3, P4, O1, O2, T3, T4, T5, T6, A1, A2, Fz, Cz, Pz). The following electrodes were missing or displaced: none. ? ?The occipital dominant rhythm was 9-11 Hz. This activity is reactive to stimulation. Drowsiness was manifested by background fragmentation; deeper stages of sleep were identified by K complexes and sleep spindles. There was no focal slowing. There were no interictal epileptiform discharges. There were no electrographic seizures identified. Photic stimulation and hyperventilation were not performed.  ? ?Impression: This EEG was obtained while awake and asleep and is normal. ?   ?Clinical Correlation: Normal EEGs, however, do not rule out epilepsy. ? ?Bing Neighbors, MD ?[facility] ?[phone] ? ?If 7pm- 7am, please page neurology on call as listed in AMION.  ?

## 2021-07-29 NOTE — Plan of Care (Addendum)
Neurology plan of care ? ?MRI brain wwo contrast: unremarkable ? 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  ? ?CNS imaging personally reviewed. ? ?rEEG - normal awake and asleep, personal review ? ?TTE - grade I diastolic dysfunction, no other abnl, no etiology for syncope identified ? ?Seizure and syncope workups completed. Patient may be discharged home on keppra 500mg  bid. See consult note from yesterday for rationale for starting AED (patient reports multiple prior episodes c/f aura / focal seizure not necessarily in context of EtOH withdrawal). Please counsel prior to discharge that according to [place] law, he is not allowed to drive for 6 mos after last seizure or spell of altered consciousness. He also should not perform any activities that could be dangerous if he had a seizure while doing them, such as climbing ladders, operating heavy machinery, or swimming alone. I will arrange outpatient neurology f/u.  ? ?Neurology to sign off, but please re-engage if additional neurologic concerns arise. ? ?Su Monks, MD ?[facility] ?[phone] ? ?If 7pm- 7am, please page neurology on call as listed in [place]. ? ?

## 2021-12-27 ENCOUNTER — Emergency Department
Admission: EM | Admit: 2021-12-27 | Discharge: 2021-12-27 | Disposition: A | Discharge status: Home / Self Care | Payer: Self-pay | Attending: Emergency Medicine | Admitting: Emergency Medicine

## 2021-12-27 ENCOUNTER — Other Ambulatory Visit: Payer: Self-pay

## 2021-12-27 DIAGNOSIS — F10130 Alcohol abuse with withdrawal, uncomplicated: Secondary | ICD-10-CM | POA: Insufficient documentation

## 2021-12-27 DIAGNOSIS — I10 Essential (primary) hypertension: Secondary | ICD-10-CM | POA: Insufficient documentation

## 2021-12-27 DIAGNOSIS — Y900 Blood alcohol level of less than 20 mg/100 ml: Secondary | ICD-10-CM | POA: Insufficient documentation

## 2021-12-27 DIAGNOSIS — F17200 Nicotine dependence, unspecified, uncomplicated: Secondary | ICD-10-CM | POA: Insufficient documentation

## 2021-12-27 DIAGNOSIS — R000 Tachycardia, unspecified: Secondary | ICD-10-CM | POA: Insufficient documentation

## 2021-12-27 DIAGNOSIS — F10939 Alcohol use, unspecified with withdrawal, unspecified: Secondary | ICD-10-CM

## 2021-12-27 DIAGNOSIS — R251 Tremor, unspecified: Secondary | ICD-10-CM | POA: Insufficient documentation

## 2021-12-27 LAB — URINE DRUG SCREEN, QUALITATIVE (ARMC ONLY)
Amphetamines, Ur Screen: NOT DETECTED
Barbiturates, Ur Screen: NOT DETECTED
Benzodiazepine, Ur Scrn: NOT DETECTED
Cannabinoid 50 Ng, Ur ~~LOC~~: NOT DETECTED
Cocaine Metabolite,Ur ~~LOC~~: NOT DETECTED
MDMA (Ecstasy)Ur Screen: NOT DETECTED
Methadone Scn, Ur: NOT DETECTED
Opiate, Ur Screen: NOT DETECTED
Phencyclidine (PCP) Ur S: NOT DETECTED
Tricyclic, Ur Screen: NOT DETECTED

## 2021-12-27 LAB — COMPREHENSIVE METABOLIC PANEL
ALT: 39 U/L (ref 0–44)
AST: 93 U/L — ABNORMAL HIGH (ref 15–41)
Albumin: 4.4 g/dL (ref 3.5–5.0)
Alkaline Phosphatase: 112 U/L (ref 38–126)
Anion gap: 10 (ref 5–15)
BUN: 6 mg/dL (ref 6–20)
CO2: 23 mmol/L (ref 22–32)
Calcium: 10 mg/dL (ref 8.9–10.3)
Chloride: 101 mmol/L (ref 98–111)
Creatinine, Ser: 1.01 mg/dL (ref 0.61–1.24)
GFR, Estimated: >60 mL/min (ref >60)
Glucose, Bld: 112 mg/dL — ABNORMAL HIGH (ref 70–99)
Potassium: 4.7 mmol/L (ref 3.5–5.1)
Sodium: 134 mmol/L — ABNORMAL LOW (ref 135–145)
Total Bilirubin: 4.1 mg/dL — ABNORMAL HIGH (ref 0.3–1.2)
Total Protein: 8.9 g/dL — ABNORMAL HIGH (ref 6.5–8.1)

## 2021-12-27 LAB — CBC
HCT: 37.2 % — ABNORMAL LOW (ref 39.0–52.0)
Hemoglobin: 12.3 g/dL — ABNORMAL LOW (ref 13.0–17.0)
MCH: 32.7 pg (ref 26.0–34.0)
MCHC: 33.1 g/dL (ref 30.0–36.0)
MCV: 98.9 fL (ref 80.0–100.0)
Platelets: 153 10*3/uL (ref 150–400)
RBC: 3.76 MIL/uL — ABNORMAL LOW (ref 4.22–5.81)
RDW: 12.3 % (ref 11.5–15.5)
WBC: 5.7 10*3/uL (ref 4.0–10.5)
nRBC: 0 % (ref 0.0–0.2)

## 2021-12-27 LAB — ETHANOL: Alcohol, Ethyl (B): <10 mg/dL (ref <10)

## 2021-12-27 MED ORDER — CHLORDIAZEPOXIDE HCL 25 MG PO CAPS
50.0000 mg | ORAL_CAPSULE | Freq: Once | ORAL | Status: AC
  Administered 2021-12-27: 50 mg via ORAL
  Filled 2021-12-27: qty 2

## 2021-12-27 MED ORDER — DIAZEPAM 5 MG PO TABS
10.0000 mg | ORAL_TABLET | Freq: Once | ORAL | Status: AC
  Administered 2021-12-27: 10 mg via ORAL
  Filled 2021-12-27: qty 2

## 2021-12-27 MED ORDER — CHLORDIAZEPOXIDE HCL 25 MG PO CAPS: 50.0000 mg | ORAL_CAPSULE | Freq: Four times a day (QID) | ORAL | 0 refills | Status: AC

## 2021-12-27 MED ORDER — THIAMINE HCL 100 MG PO TABS
100.0000 mg | ORAL_TABLET | Freq: Once | ORAL | Status: AC
  Administered 2021-12-27: 100 mg via ORAL
  Filled 2021-12-27: qty 1

## 2021-12-27 NOTE — ED Provider Triage Note (Signed)
Emergency Medicine Provider Triage Evaluation Note  Matthew [name] , a 61 y.o. male  was evaluated in triage.  Pt complains of shaking due to alcohol withdrawal. He needs clearance for rehab.  Physical Exam  BP (!) 170/107   Pulse (!) 119   Temp 99.3 F (37.4 C) (Oral)   Resp 18   SpO2 99%  Gen:   Awake, no distress   Resp:  Normal effort  MSK:   Moves extremities without difficulty  Other:   Medical Decision Making  Medically screening exam initiated at 12:52 PM.  Appropriate orders placed.  FRANCOIS ELK was informed that the remainder of the evaluation will be completed by another provider, this initial triage assessment does not replace that evaluation, and the importance of remaining in the ED until their evaluation is complete.    Chinita Pester, FNP 12/27/21 1330

## 2021-12-27 NOTE — ED Triage Notes (Signed)
Pt to ED via POV. Pt currently homeless. Pt reports alcohol withdraws and last consumed last night. Pt trying to get into rehab center and states facility request pt be seen prior to admission for withdrawal symptoms. Facility is [facility].  Pt with hx of seizures from withdraws.

## 2021-12-27 NOTE — ED Provider Notes (Signed)
[facility] Provider Note    Event Date/Time   First MD Initiated Contact with Patient 12/27/21 1611     (approximate)   History   Alcohol Intoxication (Need clearance for homeless shelter)   HPI  Matthew [name] is a 61 y.o. male past medical history of hypertension, alcohol use disorder with significant withdrawal history and withdrawal seizures presents with alcohol withdrawal and for medical clearance for detox.  Patient would like to go to detox but was told he needed to be cleared medically first.  He drinks about 12-20 beers per day last drink was about 8 PM last night.  Does feel tremulous but denies hallucinations or seizures.  No other medical complaints currently.    Past Medical History:  Diagnosis Date   Hypertension    Pneumothorax 12/2014    Patient Active Problem List   Diagnosis Date Noted   Seizure (HCC) 07/28/2021   Macrocytic anemia 07/28/2021   Aftercare 02/11/2015   Pain and swelling of right elbow 01/07/2015   Pulmonary infiltrate    Contusion of right shoulder    Eczema 10/30/2014   Alcohol use disorder, severe, dependence (HCC) 10/24/2014   Tobacco use disorder 10/24/2014   HTN (hypertension), benign 10/24/2014   Major depressive disorder, single episode, severe without psychotic features (HCC) 10/24/2014     Physical Exam  Triage Vital Signs: ED Triage Vitals  Enc Vitals Group     BP 12/27/21 1247 (!) 170/107     Pulse Rate 12/27/21 1247 (!) 119     Resp 12/27/21 1247 18     Temp 12/27/21 1247 99.3 F (37.4 C)     Temp Source 12/27/21 1247 Oral     SpO2 12/27/21 1247 99 %     Weight --      Height --      Head Circumference --      Peak Flow --      Pain Score 12/27/21 1246 0     Pain Loc --      Pain Edu? --      Excl. in [place]? --     Most recent vital signs: Vitals:   12/27/21 1615 12/27/21 1751  BP: (!) 187/84 (!) 137/98  Pulse: 87 (!) 107  Resp: 20 18  Temp: 98.6 F (37 C)   SpO2: 97% 97%      General: Awake, no distress.  CV:  Good peripheral perfusion.  Resp:  Normal effort.  Abd:  No distention.  Neuro:             Awake, Alert, Oriented x 3  Other:  Patient is mentating normally not confused not reacting to internal stimuli is tremulous   ED Results / Procedures / Treatments  Labs (all labs ordered are listed, but only abnormal results are displayed) Labs Reviewed  COMPREHENSIVE METABOLIC PANEL - Abnormal; Notable for the following components:      Result Value   Sodium 134 (*)    Glucose, Bld 112 (*)    Total Protein 8.9 (*)    AST 93 (*)    Total Bilirubin 4.1 (*)    All other components within normal limits  CBC - Abnormal; Notable for the following components:   RBC 3.76 (*)    Hemoglobin 12.3 (*)    HCT 37.2 (*)    All other components within normal limits  ETHANOL  URINE DRUG SCREEN, QUALITATIVE ([facility] ONLY)     EKG     RADIOLOGY  PROCEDURES:  Critical Care performed: Yes, see critical care procedure note(s)  Procedures  The patient is on the cardiac monitor to evaluate for evidence of arrhythmia and/or significant heart rate changes.   MEDICATIONS ORDERED IN ED: Medications  diazepam (VALIUM) tablet 10 mg (10 mg Oral Given 12/27/21 1649)  chlordiazePOXIDE (LIBRIUM) capsule 50 mg (50 mg Oral Given 12/27/21 1822)  thiamine (VITAMIN B1) tablet 100 mg (100 mg Oral Given 12/27/21 1822)     IMPRESSION / MDM / ASSESSMENT AND PLAN / ED COURSE  I reviewed the triage vital signs and the nursing notes.                              Patient's presentation is most consistent with acute presentation with potential threat to life or bodily function.  Differential diagnosis includes, but is not limited to, alcohol withdraw, potential for withdrawal seizures Dts  Patient is a 61 year old male with significant alcohol use history presenting for medical clearance for detox.  He would like to go to [facility] but the facility  requested he is seen by a physician prior to admission.  Last drink about see 9 hours prior to arrival.  He is tremulous and hypertensive initially tachycardic but heart rate improved to 80s without intervention.  He is mentating normally.  Not having DTs or hallucinosis currently.  Will try treatment with oral benzos but may need IV placement.  Patient received 10 mg of Valium p.o.  After which his tremors improved heart rate is mildly elevated.  Unfortunately he is not able to go to the detox facility tonight as they will only accept people from 9 AM to 4 PM, is currently about 7 PM.  Patient does not need inpatient treatment for his alcohol withdrawal currently and due to ED boarding restrictions are were unable to hold him in the ED overnight.  Will prescribe him Librium to bridge him to going to detox.       FINAL CLINICAL IMPRESSION(S) / ED DIAGNOSES   Final diagnoses:  Alcohol withdrawal syndrome with complication (HCC)     Rx / DC Orders   ED Discharge Orders          Ordered    chlordiazePOXIDE (LIBRIUM) 25 MG capsule  Every 6 hours        12/27/21 1847             Note:  This document was prepared using Dragon voice recognition software and may include unintentional dictation errors.   Georga Hacking, MD 12/27/21 [phone]

## 2021-12-27 NOTE — Discharge Instructions (Signed)
Please go to the detox facility tomorrow morning. If you are able to go to the pharmacy, please take the librium every 6 hours until you go to the detox facility.

## 2022-07-06 ENCOUNTER — Encounter: Payer: Self-pay | Admitting: Emergency Medicine

## 2022-07-06 ENCOUNTER — Emergency Department
Admission: EM | Admit: 2022-07-06 | Discharge: 2022-07-06 | Disposition: A | Discharge status: Home / Self Care | Payer: Self-pay | Attending: Emergency Medicine | Admitting: Emergency Medicine

## 2022-07-06 ENCOUNTER — Other Ambulatory Visit: Payer: Self-pay

## 2022-07-06 ENCOUNTER — Emergency Department: Payer: Self-pay

## 2022-07-06 DIAGNOSIS — R079 Chest pain, unspecified: Secondary | ICD-10-CM | POA: Insufficient documentation

## 2022-07-06 DIAGNOSIS — I10 Essential (primary) hypertension: Secondary | ICD-10-CM | POA: Insufficient documentation

## 2022-07-06 LAB — BASIC METABOLIC PANEL
Anion gap: 8 (ref 5–15)
BUN: 15 mg/dL (ref 6–20)
CO2: 23 mmol/L (ref 22–32)
Calcium: 8.9 mg/dL (ref 8.9–10.3)
Chloride: 105 mmol/L (ref 98–111)
Creatinine, Ser: 0.93 mg/dL (ref 0.61–1.24)
GFR, Estimated: >60 mL/min (ref >60)
Glucose, Bld: 90 mg/dL (ref 70–99)
Potassium: 4 mmol/L (ref 3.5–5.1)
Sodium: 136 mmol/L (ref 135–145)

## 2022-07-06 LAB — CBC
HCT: 39.6 % (ref 39.0–52.0)
Hemoglobin: 13.1 g/dL (ref 13.0–17.0)
MCH: 31.3 pg (ref 26.0–34.0)
MCHC: 33.1 g/dL (ref 30.0–36.0)
MCV: 94.7 fL (ref 80.0–100.0)
Platelets: 227 10*3/uL (ref 150–400)
RBC: 4.18 MIL/uL — ABNORMAL LOW (ref 4.22–5.81)
RDW: 12.5 % (ref 11.5–15.5)
WBC: 8.2 10*3/uL (ref 4.0–10.5)
nRBC: 0 % (ref 0.0–0.2)

## 2022-07-06 LAB — TROPONIN I (HIGH SENSITIVITY): Troponin I (High Sensitivity): <2 ng/L (ref <18)

## 2022-07-06 MED ORDER — AMLODIPINE BESYLATE 5 MG PO TABS: 5.0000 mg | ORAL_TABLET | Freq: Every day | ORAL | 11 refills | Status: DC

## 2022-07-06 NOTE — ED Notes (Signed)
Patient transported to X-ray 

## 2022-07-06 NOTE — ED Provider Notes (Signed)
[facility] Provider Note   Event Date/Time   First MD Initiated Contact with Patient 07/06/22 0830     (approximate) History  Chest Pain  HPI Matthew [name] is a 62 y.o. male with no stated past medical history who presents for sharp left-sided intermittent chest pain that does not radiate and is not associated with exertion.  Patient states that this pain is 7/10, intermittent, and resolves spontaneously at anywhere between 5 minutes and 30 minutes.  Patient does states that he has been on blood pressure medication in the past while in an inpatient rehab facility as well as in jail but has never been prescribed medication as an outpatient. ROS: Patient currently denies any vision changes, tinnitus, difficulty speaking, facial droop, sore throat, chest pain, shortness of breath, abdominal pain, nausea/vomiting/diarrhea, dysuria, or weakness/numbness/paresthesias in any extremity   Physical Exam  Triage Vital Signs: ED Triage Vitals [07/06/22 0828]  Enc Vitals Group     BP (!) 148/71     Pulse Rate 85     Resp 18     Temp 97.9 F (36.6 C)     Temp Source Oral     SpO2 97 %     Weight      Height      Head Circumference      Peak Flow      Pain Score 7     Pain Loc      Pain Edu?      Excl. in [place]?    Most recent vital signs: Vitals:   07/06/22 0828  BP: (!) 148/71  Pulse: 85  Resp: 18  Temp: 97.9 F (36.6 C)  SpO2: 97%   General: Awake, oriented x4. CV:  Good peripheral perfusion.  Resp:  Normal effort.  Abd:  No distention.  Other:  Middle-aged African-American male laying in bed in no acute distress ED Results / Procedures / Treatments  Labs (all labs ordered are listed, but only abnormal results are displayed) Labs Reviewed  CBC - Abnormal; Notable for the following components:      Result Value   RBC 4.18 (*)    All other components within normal limits  BASIC METABOLIC PANEL  TROPONIN I (HIGH SENSITIVITY)  TROPONIN I (HIGH  SENSITIVITY)   EKG ED ECG REPORT I, Naaman Plummer, the attending physician, personally viewed and interpreted this ECG. Date: 07/06/2022 EKG Time: 0825 Rate: 85 Rhythm: normal sinus rhythm QRS Axis: normal Intervals: normal ST/T Wave abnormalities: normal Narrative Interpretation: no evidence of acute ischemia RADIOLOGY ED MD interpretation: 2 view chest x-ray interpreted by me shows no evidence of acute abnormalities including no pneumonia, pneumothorax, or widened mediastinum -Agree with radiology assessment Official radiology report(s): DG Chest 2 View  Result Date: 07/06/2022 CLINICAL DATA:  Chest pain EXAM: CHEST - 2 VIEW COMPARISON:  CXR 01/07/15 FINDINGS: No pleural effusion. No pneumothorax. Normal cardiac and mediastinal contours. No focal airspace opacity. Visualized upper abdomen is unremarkable. No radiographically apparent displaced rib fracture. Vertebral body heights are maintained. IMPRESSION: No focal airspace opacity. Electronically Signed   By: Marin Roberts M.D.   On: 07/06/2022 08:57   PROCEDURES: Critical Care performed: No .1-3 Lead EKG Interpretation  Performed by: Naaman Plummer, MD Authorized by: Naaman Plummer, MD     Interpretation: normal     ECG rate:  71   ECG rate assessment: normal     Rhythm: sinus rhythm     Ectopy: none  Conduction: normal    MEDICATIONS ORDERED IN ED: Medications - No data to display IMPRESSION / MDM / [place] / ED COURSE  I reviewed the triage vital signs and the nursing notes.                             The patient is on the cardiac monitor to evaluate for evidence of arrhythmia and/or significant heart rate changes. Patient's presentation is most consistent with acute presentation with potential threat to life or bodily function. Presents to the emergency department complaining of chest pain and high blood pressure. Patient is otherwise asymptomatic without confusion, hematuria, or SOB.  DDx: CV,  AMI, heart failure, renal infarction or failure or other end organ damage.  Disposition: Discussed with patient their elevated blood pressure and need for close outpatient management of their hypertension. Will provide a prescription for amlodipine 2m PO daily and arrange for the patient to follow up in a primary care clinic   FINAL CLINICAL IMPRESSION(S) / ED DIAGNOSES   Final diagnoses:  Uncontrolled hypertension  Chest pain, unspecified type   Rx / DC Orders   ED Discharge Orders          Ordered    amLODipine (NORVASC) 5 MG tablet  Daily        07/06/22 1043           Note:  This document was prepared using Dragon voice recognition software and may include unintentional dictation errors.   BNaaman Plummer MD 07/06/22 1045

## 2022-07-06 NOTE — ED Triage Notes (Signed)
Patient to ED for left side chest pain. Patient states pain has been ongoing x2 weeks. Describes pain as sharp. Denies cardiac hx.

## 2022-07-20 ENCOUNTER — Other Ambulatory Visit: Payer: Self-pay

## 2022-07-20 ENCOUNTER — Inpatient Hospital Stay
Admission: AD | Admit: 2022-07-20 | Discharge: 2022-07-28 | DRG: 881 | Disposition: A | Discharge status: Home / Self Care | Payer: 59 | Source: Intra-hospital | Attending: Psychiatry | Admitting: Psychiatry

## 2022-07-20 ENCOUNTER — Encounter: Payer: Self-pay | Admitting: Psychiatry

## 2022-07-20 ENCOUNTER — Emergency Department
Admission: EM | Admit: 2022-07-20 | Discharge: 2022-07-20 | Disposition: A | Discharge status: Other Acute Inpatient Hospital | Payer: Self-pay | Attending: Student in an Organized Health Care Education/Training Program | Admitting: Student in an Organized Health Care Education/Training Program

## 2022-07-20 DIAGNOSIS — Z1152 Encounter for screening for COVID-19: Secondary | ICD-10-CM | POA: Insufficient documentation

## 2022-07-20 DIAGNOSIS — F329 Major depressive disorder, single episode, unspecified: Secondary | ICD-10-CM | POA: Diagnosis present

## 2022-07-20 DIAGNOSIS — R12 Heartburn: Secondary | ICD-10-CM | POA: Diagnosis present

## 2022-07-20 DIAGNOSIS — F322 Major depressive disorder, single episode, severe without psychotic features: Secondary | ICD-10-CM | POA: Diagnosis present

## 2022-07-20 DIAGNOSIS — G4700 Insomnia, unspecified: Secondary | ICD-10-CM | POA: Diagnosis present

## 2022-07-20 DIAGNOSIS — Z833 Family history of diabetes mellitus: Secondary | ICD-10-CM | POA: Diagnosis not present

## 2022-07-20 DIAGNOSIS — I10 Essential (primary) hypertension: Secondary | ICD-10-CM | POA: Diagnosis present

## 2022-07-20 DIAGNOSIS — R45851 Suicidal ideations: Secondary | ICD-10-CM | POA: Diagnosis present

## 2022-07-20 DIAGNOSIS — F17200 Nicotine dependence, unspecified, uncomplicated: Secondary | ICD-10-CM | POA: Diagnosis present

## 2022-07-20 DIAGNOSIS — F419 Anxiety disorder, unspecified: Secondary | ICD-10-CM | POA: Diagnosis present

## 2022-07-20 DIAGNOSIS — F332 Major depressive disorder, recurrent severe without psychotic features: Secondary | ICD-10-CM | POA: Diagnosis not present

## 2022-07-20 DIAGNOSIS — F1020 Alcohol dependence, uncomplicated: Secondary | ICD-10-CM | POA: Diagnosis present

## 2022-07-20 DIAGNOSIS — F17210 Nicotine dependence, cigarettes, uncomplicated: Secondary | ICD-10-CM | POA: Diagnosis present

## 2022-07-20 LAB — BASIC METABOLIC PANEL
Anion gap: 8 (ref 5–15)
Anion gap: 8 (ref 5–15)
BUN: 11 mg/dL (ref 6–20)
BUN: 10 mg/dL (ref 6–20)
CO2: 24 mmol/L (ref 22–32)
CO2: 23 mmol/L (ref 22–32)
Calcium: 8.9 mg/dL (ref 8.9–10.3)
Calcium: 8.8 mg/dL — ABNORMAL LOW (ref 8.9–10.3)
Chloride: 96 mmol/L — ABNORMAL LOW (ref 98–111)
Chloride: 99 mmol/L (ref 98–111)
Creatinine, Ser: 0.8 mg/dL (ref 0.61–1.24)
Creatinine, Ser: 0.77 mg/dL (ref 0.61–1.24)
GFR, Estimated: >60 mL/min (ref >60)
GFR, Estimated: >60 mL/min (ref >60)
Glucose, Bld: 105 mg/dL — ABNORMAL HIGH (ref 70–99)
Glucose, Bld: 113 mg/dL — ABNORMAL HIGH (ref 70–99)
Potassium: 4.8 mmol/L (ref 3.5–5.1)
Potassium: 4 mmol/L (ref 3.5–5.1)
Sodium: 128 mmol/L — ABNORMAL LOW (ref 135–145)
Sodium: 130 mmol/L — ABNORMAL LOW (ref 135–145)

## 2022-07-20 LAB — RESP PANEL BY RT-PCR (RSV, FLU A&B, COVID)  RVPGX2
Influenza A by PCR: NEGATIVE
Influenza B by PCR: NEGATIVE
Resp Syncytial Virus by PCR: NEGATIVE
SARS Coronavirus 2 by RT PCR: NEGATIVE

## 2022-07-20 LAB — URINE DRUG SCREEN, QUALITATIVE (ARMC ONLY)
Amphetamines, Ur Screen: NOT DETECTED
Barbiturates, Ur Screen: NOT DETECTED
Benzodiazepine, Ur Scrn: NOT DETECTED
Cannabinoid 50 Ng, Ur ~~LOC~~: NOT DETECTED
Cocaine Metabolite,Ur ~~LOC~~: NOT DETECTED
MDMA (Ecstasy)Ur Screen: NOT DETECTED
Methadone Scn, Ur: NOT DETECTED
Opiate, Ur Screen: NOT DETECTED
Phencyclidine (PCP) Ur S: NOT DETECTED
Tricyclic, Ur Screen: NOT DETECTED

## 2022-07-20 LAB — COMPREHENSIVE METABOLIC PANEL
ALT: 43 U/L (ref 0–44)
AST: 96 U/L — ABNORMAL HIGH (ref 15–41)
Albumin: 4.6 g/dL (ref 3.5–5.0)
Alkaline Phosphatase: 166 U/L — ABNORMAL HIGH (ref 38–126)
Anion gap: 12 (ref 5–15)
BUN: 12 mg/dL (ref 6–20)
CO2: 24 mmol/L (ref 22–32)
Calcium: 9.6 mg/dL (ref 8.9–10.3)
Chloride: 90 mmol/L — ABNORMAL LOW (ref 98–111)
Creatinine, Ser: 0.92 mg/dL (ref 0.61–1.24)
GFR, Estimated: >60 mL/min (ref >60)
Glucose, Bld: 129 mg/dL — ABNORMAL HIGH (ref 70–99)
Potassium: 4.6 mmol/L (ref 3.5–5.1)
Sodium: 126 mmol/L — ABNORMAL LOW (ref 135–145)
Total Bilirubin: 5.3 mg/dL — ABNORMAL HIGH (ref 0.3–1.2)
Total Protein: 8.1 g/dL (ref 6.5–8.1)

## 2022-07-20 LAB — CBC
HCT: 41.1 % (ref 39.0–52.0)
Hemoglobin: 14.7 g/dL (ref 13.0–17.0)
MCH: 31.8 pg (ref 26.0–34.0)
MCHC: 35.8 g/dL (ref 30.0–36.0)
MCV: 89 fL (ref 80.0–100.0)
Platelets: 254 10*3/uL (ref 150–400)
RBC: 4.62 MIL/uL (ref 4.22–5.81)
RDW: 12.1 % (ref 11.5–15.5)
WBC: 8.9 10*3/uL (ref 4.0–10.5)
nRBC: 0 % (ref 0.0–0.2)

## 2022-07-20 LAB — ACETAMINOPHEN LEVEL: Acetaminophen (Tylenol), Serum: <10 ug/mL — ABNORMAL LOW (ref 10–30)

## 2022-07-20 LAB — ETHANOL: Alcohol, Ethyl (B): <10 mg/dL (ref <10)

## 2022-07-20 LAB — SALICYLATE LEVEL: Salicylate Lvl: <7 mg/dL — ABNORMAL LOW (ref 7.0–30.0)

## 2022-07-20 MED ORDER — ALUM & MAG HYDROXIDE-SIMETH 200-200-20 MG/5ML PO SUSP: 30.0000 mL | ORAL | Status: DC | PRN

## 2022-07-20 MED ORDER — LORAZEPAM 2 MG/ML IJ SOLN: 2.0000 mg | Freq: Two times a day (BID) | INTRAMUSCULAR | Status: AC | PRN

## 2022-07-20 MED ORDER — MAGNESIUM HYDROXIDE 400 MG/5ML PO SUSP: 30.0000 mL | Freq: Every day | ORAL | Status: DC | PRN

## 2022-07-20 MED ORDER — LORAZEPAM 2 MG PO TABS: 2.0000 mg | ORAL_TABLET | Freq: Two times a day (BID) | ORAL | Status: AC | PRN

## 2022-07-20 MED ORDER — HALOPERIDOL LACTATE 5 MG/ML IJ SOLN: 5.0000 mg | Freq: Two times a day (BID) | INTRAMUSCULAR | Status: AC | PRN

## 2022-07-20 MED ORDER — ACETAMINOPHEN 325 MG PO TABS: 650.0000 mg | ORAL_TABLET | Freq: Four times a day (QID) | ORAL | Status: DC | PRN

## 2022-07-20 MED ORDER — DIPHENHYDRAMINE HCL 25 MG PO CAPS: 50.0000 mg | ORAL_CAPSULE | Freq: Two times a day (BID) | ORAL | Status: AC | PRN

## 2022-07-20 MED ORDER — HALOPERIDOL 5 MG PO TABS: 5.0000 mg | ORAL_TABLET | Freq: Two times a day (BID) | ORAL | Status: AC | PRN

## 2022-07-20 MED ORDER — SODIUM CHLORIDE 0.9 % IV BOLUS
1000.0000 mL | Freq: Once | INTRAVENOUS | Status: AC
  Administered 2022-07-20: 1000 mL via INTRAVENOUS

## 2022-07-20 MED ORDER — DIPHENHYDRAMINE HCL 50 MG/ML IJ SOLN: 50.0000 mg | Freq: Two times a day (BID) | INTRAMUSCULAR | Status: AC | PRN

## 2022-07-20 NOTE — Group Note (Signed)
Date:  07/20/2022 Time:  11:52 PM  Group Topic/Focus:  Making Healthy Choices:   The focus of this group is to help patients identify negative/unhealthy choices they were using prior to admission and identify positive/healthier coping strategies to replace them upon discharge. Wrap-Up Group:   The focus of this group is to help patients review their daily goal of treatment and discuss progress on daily workbooks.    Participation Level:  Minimal  Participation Quality:  Appropriate  Affect:  Appropriate  Cognitive:  Appropriate  Insight: Appropriate  Engagement in Group:  Engaged  Modes of Intervention:  Discussion  Additional Comments:    Ladona Mow 07/20/2022, 11:52 PM

## 2022-07-20 NOTE — ED Notes (Signed)
Pt alert, calm, cooperative and denies pain currently.

## 2022-07-20 NOTE — ED Notes (Signed)
Patient provided hospital clothing. Patient belongings placed in bag: 2 necklaces, phone, wallet, black shoes, white socks, jeans, green shirt, blue jacket, cigarettes, car keys,

## 2022-07-20 NOTE — ED Triage Notes (Signed)
Pt reports that nothing is going right and having thoughts of hurting himself.

## 2022-07-20 NOTE — Consult Note (Signed)
[facility] Consult   Reason for Consult:  Suicidal Ideation and Depression Referring Physician:  Merlyn Lot MD Patient Identification: Matthew [name] MRN:  [id number] Principal Diagnosis: Major depressive disorder, single episode, severe without psychotic features ([place]) Diagnosis:  Principal Problem:   Major depressive disorder, single episode, severe without psychotic features ([place])   Total Time spent with patient: 45 minutes  Subjective:  "I am depressed, nothing is going right and I drink too much."   HPI: 62 year old male presents today with suicidal ideations with a plan and depression. He is currently under IVC by the EDP. Patient has a history of alcohol abuse disorder, seizures due to alcohol withdrawal, depression, suicidal ideations. Patient continues to endorse SI with a plan to either use his knives or OD on his blood pressure pills. Pt expresses that his drinking "controls" his life, affecting his job, living situation and relationships. He works at [employer] for [employer] and desires to keep working there if he can get his alcohol under control. Patient states "the best way to please everyone is to just not be here." Patient attempted detox with [facility] about 3 months ago. He then went to [facility] but got kicked out for drinking. Patient admits he had 1  40oz beer yesterday and has been drinking for the past 40 years. Patient currently lives with a friend and is surrounded with friends who all drink. He expresses he doesn't have much social support and tends to keep everything to himself. He has a daughter that recently moved to [place] but doesn't have much contact with her.  Patient denies hallucinations, abuse or trauma. He endorses having a poor appetite and hasn't eaten in a week, because he prefers to drink. He states sleep is satisfactory, because he drinks until he falls asleep. Patient expresses having occasional nightmares.   Patient was admitted  to inpatient psych for 11 days in 10/2014 for SI and depression. He was put on citalopram, trazodone, hydroxyzine. The patient doesn't remember if these medications worked for him. Patient doesn't have a psychiatrist or a therapist. Patient states that he is willing to start an antidepressant "to feel better about his life."    Past Psychiatric History: Alcohol abuse disorder with withdrawal symptoms, depression, SI. Previous psychiatric admission 10/2014 for SI and depression.  Risk to Self:  Yes Risk to Others:  No  Prior Inpatient Therapy:  Yes  Prior Outpatient Therapy:  No   Past Medical History:  Past Medical History:  Diagnosis Date   Hypertension    Pneumothorax 12/2014    Past Surgical History:  Procedure Laterality Date   APPENDECTOMY     Family History:  Family History  Problem Relation Age of Onset   Diabetes Maternal Grandmother    Family Psychiatric History: Patient reports no known family hx Social History:  Social History   Substance and Sexual Activity  Alcohol Use Yes   Comment: 80 oz beer daily, 1/2 pint liquor daily     Social History   Substance and Sexual Activity  Drug Use No    Social History   Socioeconomic History   Marital status: Divorced    Spouse name: Not on file   Number of children: Not on file   Years of education: Not on file   Highest education level: Not on file  Occupational History   Not on file  Tobacco Use   Smoking status: Every Day    Packs/day: 0.50    Years: 35.00  Total pack years: 17.50    Types: Cigarettes   Smokeless tobacco: Never  Substance and Sexual Activity   Alcohol use: Yes    Comment: 80 oz beer daily, 1/2 pint liquor daily   Drug use: No   Sexual activity: Not Currently    Birth control/protection: None  Other Topics Concern   Not on file  Social History Narrative   Not on file   Social Determinants of Health   Financial Resource Strain: Not on file  Food Insecurity: Not on file   Transportation Needs: Not on file  Physical Activity: Not on file  Stress: Not on file  Social Connections: Not on file   Additional Social History:    Allergies:  No Known Allergies  Labs:  Results for orders placed or performed during the hospital encounter of 07/20/22 (from the past 48 hour(s))  Urine Drug Screen, Qualitative     Status: None   Collection Time: 07/20/22  9:50 AM  Result Value Ref Range   Tricyclic, Ur Screen NONE DETECTED NONE DETECTED   Amphetamines, Ur Screen NONE DETECTED NONE DETECTED   MDMA (Ecstasy)Ur Screen NONE DETECTED NONE DETECTED   Cocaine Metabolite,Ur [place] NONE DETECTED NONE DETECTED   Opiate, Ur Screen NONE DETECTED NONE DETECTED   Phencyclidine (PCP) Ur S NONE DETECTED NONE DETECTED   Cannabinoid 50 Ng, Ur [place] NONE DETECTED NONE DETECTED   Barbiturates, Ur Screen NONE DETECTED NONE DETECTED   Benzodiazepine, Ur Scrn NONE DETECTED NONE DETECTED   Methadone Scn, Ur NONE DETECTED NONE DETECTED    Comment: (NOTE) Tricyclics + metabolites, urine    Cutoff 1000 ng/mL Amphetamines + metabolites, urine  Cutoff 1000 ng/mL MDMA (Ecstasy), urine              Cutoff 500 ng/mL Cocaine Metabolite, urine          Cutoff 300 ng/mL Opiate + metabolites, urine        Cutoff 300 ng/mL Phencyclidine (PCP), urine         Cutoff 25 ng/mL Cannabinoid, urine                 Cutoff 50 ng/mL Barbiturates + metabolites, urine  Cutoff 200 ng/mL Benzodiazepine, urine              Cutoff 200 ng/mL Methadone, urine                   Cutoff 300 ng/mL  The urine drug screen provides only a preliminary, unconfirmed analytical test result and should not be used for non-medical purposes. Clinical consideration and professional judgment should be applied to any positive drug screen result due to possible interfering substances. A more specific alternate chemical method must be used in order to obtain a confirmed analytical result. Gas chromatography / mass spectrometry  (GC/MS) is the preferred confirm atory method. Performed at [facility], [address] [place], [place] [address]   Comprehensive metabolic panel     Status: Abnormal   Collection Time: 07/20/22  9:58 AM  Result Value Ref Range   Sodium 126 (L) 135 - 145 mmol/L   Potassium 4.6 3.5 - 5.1 mmol/L   Chloride 90 (L) 98 - 111 mmol/L   CO2 24 22 - 32 mmol/L   Glucose, Bld 129 (H) 70 - 99 mg/dL    Comment: Glucose reference range applies only to samples taken after fasting for at least 8 hours.   BUN 12 6 - 20 mg/dL   Creatinine,  Ser 0.92 0.61 - 1.24 mg/dL   Calcium 9.6 8.9 - 10.3 mg/dL   Total Protein 8.1 6.5 - 8.1 g/dL   Albumin 4.6 3.5 - 5.0 g/dL   AST 96 (H) 15 - 41 U/L   ALT 43 0 - 44 U/L   Alkaline Phosphatase 166 (H) 38 - 126 U/L   Total Bilirubin 5.3 (H) 0.3 - 1.2 mg/dL   GFR, Estimated >60 >60 mL/min    Comment: (NOTE) Calculated using the CKD-EPI Creatinine Equation (2021)    Anion gap 12 5 - 15    Comment: Performed at [facility], [address] [place], [place] [address]  Ethanol     Status: None   Collection Time: 07/20/22  9:58 AM  Result Value Ref Range   Alcohol, Ethyl (B) <10 <10 mg/dL    Comment: (NOTE) Lowest detectable limit for serum alcohol is 10 mg/dL.  For medical purposes only. Performed at [facility], [address] [place], [place] [id number]   Salicylate level     Status: Abnormal   Collection Time: 07/20/22  9:58 AM  Result Value Ref Range   Salicylate Lvl Q000111Q (L) 7.0 - 30.0 mg/dL    Comment: Performed at [facility], [address] [place], [place] [address]  Acetaminophen level     Status: Abnormal   Collection Time: 07/20/22  9:58 AM  Result Value Ref Range   Acetaminophen (Tylenol), Serum <10 (L) 10 - 30 ug/mL    Comment: (NOTE) Therapeutic concentrations vary significantly. A range of 10-30 ug/mL  may be an effective concentration for many patients. However, some  are best treated at  concentrations outside of this range. Acetaminophen concentrations >150 ug/mL at 4 hours after ingestion  and >50 ug/mL at 12 hours after ingestion are often associated with  toxic reactions.  Performed at [facility], [address] [place], [place] [address]   cbc     Status: None   Collection Time: 07/20/22  9:58 AM  Result Value Ref Range   WBC 8.9 4.0 - 10.5 K/uL   RBC 4.62 4.22 - 5.81 MIL/uL   Hemoglobin 14.7 13.0 - 17.0 g/dL   HCT 41.1 39.0 - 52.0 %   MCV 89.0 80.0 - 100.0 fL   MCH 31.8 26.0 - 34.0 pg   MCHC 35.8 30.0 - 36.0 g/dL   RDW 12.1 11.5 - 15.5 %   Platelets 254 150 - 400 K/uL   nRBC 0.0 0.0 - 0.2 %    Comment: Performed at [facility], [address] [place], [place] [address]    No current facility-administered medications for this encounter.   Current Outpatient Medications  Medication Sig Dispense Refill   amLODipine (NORVASC) 5 MG tablet Take 1 tablet (5 mg total) by mouth daily. 30 tablet 11   levETIRAcetam (KEPPRA) 500 MG tablet Take 1 tablet (500 mg total) by mouth 2 (two) times daily. (Patient not taking: Reported on 07/20/2022) 60 tablet 5    Musculoskeletal: Strength & Muscle Tone: within normal limits Gait & Station:  did not observe Patient leans: N/A   Psychiatric Specialty Exam: Physical Exam Vitals and nursing note reviewed.  HENT:     Head: Normocephalic and atraumatic.     Nose: Nose normal.  Pulmonary:     Effort: Pulmonary effort is normal.  Musculoskeletal:        General: Normal range of motion.  Neurological:     Mental Status: He is alert and  oriented to person, place, and time.  Psychiatric:        Attention and Perception: Attention and perception normal.        Mood and Affect: Mood is depressed. Mood is not anxious. Affect is flat.        Speech: Speech normal.        Behavior: Behavior normal. Behavior is cooperative.        Thought Content: Thought content is not paranoid or delusional. Thought  content includes suicidal ideation. Thought content does not include homicidal ideation. Thought content includes suicidal plan.        Cognition and Memory: Cognition normal.        Judgment: Judgment normal.     Review of Systems  Blood pressure (!) 166/83, pulse 95, temperature 98.8 F (37.1 C), temperature source Oral, resp. rate 18, height '5\' 11"'$  (1.803 m), weight 76 kg, SpO2 97 %.Body mass index is 23.37 kg/m.  General Appearance: Casual  Eye Contact:  Good  Speech:  Clear and Coherent and Normal Rate  Volume:  Normal  Mood:  Depressed, Hopeless, and Worthless  Affect:  Congruent  Thought Process:  Coherent  Orientation:  Full (Time, Place, and Person)  Thought Content:  WDL and Logical  Suicidal Thoughts:   yes, with plan (OD or slit wrists)  Homicidal Thoughts:  No  Memory:  Recent;   Good  Judgement:  Fair  Insight:  Fair  Psychomotor Activity:  Normal  Concentration:  Concentration: Good and Attention Span: Good  Recall:  Good  Fund of Knowledge:  Good  Language:  Good  Akathisia:  Negative  Handed:  Right  AIMS (if indicated):     Assets:  Communication Skills Desire for Improvement Resilience  ADL's:  Intact  Cognition:  WNL  Sleep:   "sleeps because I drink myself to sleep"    Physical Exam: Physical Exam Vitals and nursing note reviewed.  HENT:     Head: Normocephalic and atraumatic.     Nose: Nose normal.  Pulmonary:     Effort: Pulmonary effort is normal.  Musculoskeletal:        General: Normal range of motion.  Neurological:     Mental Status: He is alert and oriented to person, place, and time.  Psychiatric:        Attention and Perception: Attention and perception normal.        Mood and Affect: Mood is depressed. Mood is not anxious. Affect is flat.        Speech: Speech normal.        Behavior: Behavior normal. Behavior is cooperative.        Thought Content: Thought content is not paranoid or delusional. Thought content includes suicidal  ideation. Thought content does not include homicidal ideation. Thought content includes suicidal plan.        Cognition and Memory: Cognition normal.        Judgment: Judgment normal.    ROS Blood pressure (!) 166/83, pulse 95, temperature 98.8 F (37.1 C), temperature source Oral, resp. rate 18, height '5\' 11"'$  (1.803 m), weight 76 kg, SpO2 97 %. Body mass index is 23.37 kg/m.  Treatment Plan Summary: Daily contact with patient to assess and evaluate symptoms and progress in treatment, Medication management, and Plan Patient meets criteria for admissions into psych inpatient. Reviewed with Dr. Quentin Cornwall EDP.   Disposition: Recommend psychiatric Inpatient admission when medically cleared. Supportive therapy provided about ongoing stressors.  Ronny Flurry, NP 07/20/2022 11:10  AM

## 2022-07-20 NOTE — Plan of Care (Signed)
New admission.  Problem: Education: Goal: Knowledge of General Education information will improve Description: Including pain rating scale, medication(s)/side effects and non-pharmacologic comfort measures Outcome: Not Progressing   Problem: Health Behavior/Discharge Planning: Goal: Ability to manage health-related needs will improve Outcome: Not Progressing   Problem: Clinical Measurements: Goal: Ability to maintain clinical measurements within normal limits will improve Outcome: Not Progressing Goal: Will remain free from infection Outcome: Not Progressing Goal: Diagnostic test results will improve Outcome: Not Progressing Goal: Respiratory complications will improve Outcome: Not Progressing Goal: Cardiovascular complication will be avoided Outcome: Not Progressing   Problem: Activity: Goal: Risk for activity intolerance will decrease Outcome: Not Progressing   Problem: Nutrition: Goal: Adequate nutrition will be maintained Outcome: Not Progressing   Problem: Coping: Goal: Level of anxiety will decrease Outcome: Not Progressing   Problem: Elimination: Goal: Will not experience complications related to bowel motility Outcome: Not Progressing Goal: Will not experience complications related to urinary retention Outcome: Not Progressing   Problem: Pain Managment: Goal: General experience of comfort will improve Outcome: Not Progressing   Problem: Safety: Goal: Ability to remain free from injury will improve Outcome: Not Progressing   Problem: Skin Integrity: Goal: Risk for impaired skin integrity will decrease Outcome: Not Progressing   Problem: Education: Goal: Knowledge of [place] [facility] Education information/materials will improve Outcome: Not Progressing Goal: Emotional status will improve Outcome: Not Progressing Goal: Mental status will improve Outcome: Not Progressing Goal: Verbalization of understanding the information provided will  improve Outcome: Not Progressing   Problem: Safety: Goal: Periods of time without injury will increase Outcome: Not Progressing   Problem: Health Behavior/Discharge Planning: Goal: Identification of resources available to assist in meeting health care needs will improve Outcome: Not Progressing   Problem: Medication: Goal: Compliance with prescribed medication regimen will improve Outcome: Not Progressing   Problem: Coping: Goal: Coping ability will improve Outcome: Not Progressing Goal: Will verbalize feelings Outcome: Not Progressing   Problem: Self-Concept: Goal: Level of anxiety will decrease Outcome: Not Progressing

## 2022-07-20 NOTE — ED Notes (Signed)
Verbal report given to [facility] RN as requested.

## 2022-07-20 NOTE — ED Provider Notes (Addendum)
[facility] Provider Note    Event Date/Time   First MD Initiated Contact with Patient 07/20/22 1006     (approximate)   History   Depression   HPI  Matthew [name] is a 62 y.o. male history of substance abuse presents to the ER for evaluation of several weeks of feelings of depression and now having feelings of worsening hopelessness and suicidal ideation.  States that he would either slit his wrists or overdose on pills.  States he has previously tried to hurt himself by drinking Clorox.  No exact description as to what precipitates did these feelings is not currently on any medications.     Physical Exam   Triage Vital Signs: ED Triage Vitals  Enc Vitals Group     BP 07/20/22 0939 (!) 166/83     Pulse Rate 07/20/22 0939 95     Resp 07/20/22 0939 18     Temp 07/20/22 0939 98.8 F (37.1 C)     Temp Source 07/20/22 0939 Oral     SpO2 07/20/22 0939 97 %     Weight 07/20/22 0940 167 lb 8.8 oz (76 kg)     Height 07/20/22 0940 '5\' 11"'$  (1.803 m)     Head Circumference --      Peak Flow --      Pain Score 07/20/22 0940 0     Pain Loc --      Pain Edu? --      Excl. in [place]? --     Most recent vital signs: Vitals:   07/20/22 0939  BP: (!) 166/83  Pulse: 95  Resp: 18  Temp: 98.8 F (37.1 C)  SpO2: 97%     Constitutional: Alert  Eyes: Conjunctivae are normal.  Head: Atraumatic. Nose: No congestion/rhinnorhea. Mouth/Throat: Mucous membranes are moist.   Neck: Painless ROM.  Cardiovascular:   Good peripheral circulation. Respiratory: Normal respiratory effort.  No retractions.  Gastrointestinal: Soft and nontender.  Musculoskeletal:  no deformity Neurologic:  MAE spontaneously. No gross focal neurologic deficits are appreciated.  Skin:  Skin is warm, dry and intact. No rash noted. Psychiatric: Mood and affect are normal. Speech and behavior are normal.    ED Results / Procedures / Treatments   Labs (all labs ordered are listed, but only  abnormal results are displayed) Labs Reviewed  COMPREHENSIVE METABOLIC PANEL - Abnormal; Notable for the following components:      Result Value   Sodium 126 (*)    Chloride 90 (*)    Glucose, Bld 129 (*)    AST 96 (*)    Alkaline Phosphatase 166 (*)    Total Bilirubin 5.3 (*)    All other components within normal limits  SALICYLATE LEVEL - Abnormal; Notable for the following components:   Salicylate Lvl Q000111Q (*)    All other components within normal limits  ACETAMINOPHEN LEVEL - Abnormal; Notable for the following components:   Acetaminophen (Tylenol), Serum <10 (*)    All other components within normal limits  BASIC METABOLIC PANEL - Abnormal; Notable for the following components:   Sodium 128 (*)    Chloride 96 (*)    Glucose, Bld 105 (*)    All other components within normal limits  BASIC METABOLIC PANEL - Abnormal; Notable for the following components:   Sodium 130 (*)    Glucose, Bld 113 (*)    Calcium 8.8 (*)    All other components within normal limits  RESP PANEL BY RT-PCR (  RSV, FLU A&B, COVID)  [id number]  ETHANOL  CBC  URINE DRUG SCREEN, QUALITATIVE ([facility] ONLY)     EKG     RADIOLOGY    PROCEDURES:  Critical Care performed:   Procedures   MEDICATIONS ORDERED IN ED: Medications  sodium chloride 0.9 % bolus 1,000 mL (0 mLs Intravenous Stopped 07/20/22 1142)  sodium chloride 0.9 % bolus 1,000 mL (0 mLs Intravenous Stopped 07/20/22 1257)     IMPRESSION / MDM / ASSESSMENT AND PLAN / ED COURSE  I reviewed the triage vital signs and the nursing notes.                              Differential diagnosis includes, but is not limited to, Psychosis, delirium, medication effect, noncompliance, polysubstance abuse, Si, Hi, depression  Patient here for evaluation of SI and depressions.  Patient has psych history of substance abuse.  Laboratory testing was ordered to evaluation for underlying electrolyte derangement or signs of underlying organic pathology to  explain today's presentation.  Based on history and physical and laboratory evaluation, it appears that the patient's presentation is 2/2 underlying psychiatric disorder and will require further evaluation and management by inpatient psychiatry.  Patient was  made an IVC due to SI with plan .  Disposition pending psychiatric evaluation.    Clinical Course as of 07/20/22 1408  Wed Jul 20, 2022  1134 Patient's sodium is low likely secondary to alcohol use.  Bilirubin at baseline.  Plan will be given bolus IV fluids and recheck sodium. [PR]  1211 Repeat sodium is improving after hydration.  Will give additional IV fluids to continue the correction likely secondary to dehydration as well as mania.  He has no signs or symptoms of symptomatic hyponatremia.  Will be cleared for psychiatric evaluation. [PR]    Clinical Course User Index [PR] Merlyn Lot, MD  The patient has been placed in psychiatric observation due to the need to provide a safe environment for the patient while obtaining psychiatric consultation and evaluation, as well as ongoing medical and medication management to treat the patient's condition.  The patient has been placed under full IVC at this time.   FINAL CLINICAL IMPRESSION(S) / ED DIAGNOSES   Final diagnoses:  Suicidal ideation     Rx / DC Orders   ED Discharge Orders     None        Note:  This document was prepared using Dragon voice recognition software and may include unintentional dictation errors.    Merlyn Lot, MD 07/20/22 1023    Merlyn Lot, MD 07/20/22 [phone]

## 2022-07-20 NOTE — ED Notes (Addendum)
Hospital meal provided.  50% consumed, pt tolerated w/o complaints.   16 oz water consumed.

## 2022-07-20 NOTE — Tx Team (Signed)
Initial Treatment Plan 07/20/2022 6:00 PM Matthew [name] [id number]    PATIENT STRESSORS: Substance abuse   Other: Per patient, "life".     PATIENT STRENGTHS: Capable of independent living  Marketing executive fund of knowledge  Motivation for treatment/growth  Work skills    PATIENT IDENTIFIED PROBLEMS: SI   Alcohol abuse  Depression                 DISCHARGE CRITERIA:  Ability to meet basic life and health needs Need for constant or close observation no longer present Reduction of life-threatening or endangering symptoms to within safe limits  PRELIMINARY DISCHARGE PLAN: Outpatient therapy Return to previous living arrangement Return to previous work or school arrangements  PATIENT/FAMILY INVOLVEMENT: This treatment plan has been presented to and reviewed with the patient, Matthew [name]. The patient has been given the opportunity to ask questions and make suggestions.  Finneas Mathe, RN 07/20/2022, 6:00 PM

## 2022-07-20 NOTE — Progress Notes (Signed)
Patient calm and pleasant during assessment denying HI/AVH. Pt endorses passive SI, verbally contracts for safety. Pt observed interacting appropriately with staff and peers on the unit. Pt didn't have any medication scheduled tonight and hasn't requested anything PRN as of now. Pt given education, support, and encouragement to be active in his treatment plan. Pt being monitored Q 15 minutes for safety per unit protocol, remains safe on the unit

## 2022-07-20 NOTE — BH Assessment (Signed)
Patient is to be admitted to [facility] BMU today 07/20/22 by Dr. Weber Cooks.  Attending Physician will be Dr.  Weber Cooks .   Patient has been assigned to room 307, by [facility] Charge Nurse, Bella Kennedy.    ER staff is aware of the admission: Luann, ER Secretary   Dr. Jari Pigg, ER MD  Radonna Ricker, Patient's Nurse

## 2022-07-20 NOTE — Progress Notes (Signed)
EKG was completed on patient, with the assistance of Paige, LPN. Printout placed in patient's chart.

## 2022-07-20 NOTE — ED Notes (Signed)
IVC pending consult   

## 2022-07-20 NOTE — Progress Notes (Signed)
Admission Note:   Report was received from [place], [place] on a 62 year-old male who presents IVC in no acute distress for the treatment of SI and Depression. Patient appears flat and depressed. Patient was calm and cooperative with admission process. Patient presents with passive SI and contracts for safety upon admission. Patient denies HI/AVH and pain at this time. Patient denies anxiety, but endorsed depression, rating it an "8/10", stating "life", is making him feel this way. Patient also stated that he feels that the world would be a better place without him. Patient reports that he only takes BP medication, and when he doesn't take it, it's because "it makes me feel like my heart is going to jump out of my chest". Patient's goal for treatment is "my depression" and "alcoholism". Patient has a past medical history of HTN. Skin was assessed with Bella Kennedy, RN and found to be clear of any abnormal marks apart from multiple tattoos to bilateral arms, upper/lower; scabs on bilateral lower legs; discoloration to anterior/posterior neck; dry/flaky skin to posterior neck area. Patient searched and no contraband found and unit policies explained and understanding verbalized. Consents obtained. Food and fluids offered, and fluids accepted. Patient had no additional questions or concerns to voice to this Probation officer. Patient remains safe on the unit at this time.

## 2022-07-21 DIAGNOSIS — F332 Major depressive disorder, recurrent severe without psychotic features: Secondary | ICD-10-CM

## 2022-07-21 MED ORDER — NICOTINE POLACRILEX 2 MG MT GUM: 2.0000 mg | CHEWING_GUM | OROMUCOSAL | Status: DC | PRN

## 2022-07-21 MED ORDER — ESCITALOPRAM OXALATE 10 MG PO TABS
10.0000 mg | ORAL_TABLET | Freq: Every day | ORAL | Status: DC
  Administered 2022-07-21 – 2022-07-28 (×8): 10 mg via ORAL
  Filled 2022-07-21 (×8): qty 1

## 2022-07-21 MED ORDER — NICOTINE 21 MG/24HR TD PT24
21.0000 mg | MEDICATED_PATCH | Freq: Every day | TRANSDERMAL | Status: DC
  Administered 2022-07-24 – 2022-07-27 (×4): 21 mg via TRANSDERMAL
  Filled 2022-07-21 (×7): qty 1

## 2022-07-21 MED ORDER — AMLODIPINE BESYLATE 5 MG PO TABS
5.0000 mg | ORAL_TABLET | Freq: Every day | ORAL | Status: DC
  Administered 2022-07-21 – 2022-07-27 (×7): 5 mg via ORAL
  Filled 2022-07-21 (×7): qty 1

## 2022-07-21 NOTE — Group Note (Signed)
[facility] LCSW Group Therapy Note    Group Date: 07/21/2022 Start Time: 1300 End Time: 1400  Type of Therapy and Topic:  Group Therapy:  Overcoming Obstacles  Participation Level:  [facility] PARTICIPATION LEVEL: Active  Description of Group:   In this group patients will be encouraged to explore what they see as obstacles to their own wellness and recovery. They will be guided to discuss their thoughts, feelings, and behaviors related to these obstacles. The group will process together ways to cope with barriers, with attention given to specific choices patients can make. Each patient will be challenged to identify changes they are motivated to make in order to overcome their obstacles. This group will be process-oriented, with patients participating in exploration of their own experiences as well as giving and receiving support and challenge from other group members.  Therapeutic Goals: 1. Patient will identify personal and current obstacles as they relate to admission. 2. Patient will identify barriers that currently interfere with their wellness or overcoming obstacles.  3. Patient will identify feelings, thought process and behaviors related to these barriers. 4. Patient will identify two changes they are willing to make to overcome these obstacles:    Summary of Patient Progress Patient was present for the entirety of the group session. Patient was an active listener and participated in the topic of discussion, provided helpful advice to others, and added nuance to topic of conversation. Patient states he attempts to cope with his anger by drinking alcohol which leads to further conflict.    Therapeutic Modalities:   Cognitive Behavioral Therapy Solution Focused Therapy Motivational Interviewing Relapse Prevention Therapy   Durenda Hurt, [place]

## 2022-07-21 NOTE — [facility] Counselor (Signed)
Patient unsure at this time what he would like for his aftercare to be.  He requests time to think about it.   Assunta Curtis, MSW, LCSW 07/21/2022 11:33 AM

## 2022-07-21 NOTE — Progress Notes (Signed)
D- Patient alert and oriented x 4. Affect flat/mood depressed. Denies SI/ HI/ AVH. He denies pain. He endorces depression and anxiety. "I just want to sleep it off". Denies withdrawal symptoms from ETOH. A- Scheduled medications administered to patient, per MD orders. Support and encouragement provided.  Routine safety checks conducted every 15 minutes.  Patient informed to notify staff with problems or concerns. R- No adverse drug reactions noted. Patient contracts for safety at this time. Patient compliant with medications and treatment plan. Patient receptive, calm, and cooperative. Patient interacts well with others on the unit.  Patient remains safe at this time.

## 2022-07-21 NOTE — [facility] Suicide Risk Assessment (Signed)
[facility] INPATIENT:  Family/Significant Other Suicide Prevention Education  Suicide Prevention Education:  Patient Refusal for Family/Significant Other Suicide Prevention Education: The patient Matthew [name] has refused to provide written consent for family/significant other to be provided Family/Significant Other Suicide Prevention Education during admission and/or prior to discharge.  Physician notified.  SPE completed with pt, as pt refused to consent to family contact. SPI pamphlet provided to pt and pt was encouraged to share information with support network, ask questions, and talk about any concerns relating to SPE. Pt denies access to guns/firearms and verbalized understanding of information provided. Mobile Crisis information also provided to pt.   Rozann Lesches 07/21/2022, 11:32 AM

## 2022-07-21 NOTE — H&P (Signed)
Psychiatric Admission Assessment Adult  Patient Identification: Matthew [name] MRN:  [id number] Date of Evaluation:  07/21/2022 Chief Complaint:  MDD (major depressive disorder) [F32.9] Principal Diagnosis: MDD (major depressive disorder) Diagnosis:  Principal Problem:   MDD (major depressive disorder) Active Problems:   Alcohol use disorder, severe, dependence ([place])   Tobacco use disorder   HTN (hypertension), benign  History of Present Illness: Patient seen and chart reviewed.  62 year old man with a history of depression and alcohol abuse presented to the hospital stating he was having suicidal thoughts.  Patient states that he has been having more intrusive suicidal thoughts with thoughts of overdosing on pills although he has not yet acted on it.  Mood stays depressed and hopeless most of the time.  Has little enjoyment of life.  Sleeping poorly energy level poor.  Patient says he has felt depressed for a long time but just in the last week or so things have felt so bad that he was noting more intrusive suicidal thoughts.  Patient drinks between 15 and 24 beers daily.  Not using any other drugs.  Feels a certain hopelessness about stopping drinking.  Patient is not having any psychotic symptoms denies any hallucinations.  Currently not suffering any obvious signs of alcohol withdrawal.  He had not been engaging in any recent treatment.  Has no stable place to live. Associated Signs/Symptoms: Depression Symptoms:  depressed mood, anhedonia, insomnia, difficulty concentrating, hopelessness, suicidal thoughts with specific plan, (Hypo) Manic Symptoms:  Impulsivity, Anxiety Symptoms:  Excessive Worry, Psychotic Symptoms:   None reported PTSD Symptoms: Negative Total Time spent with patient: 45 minutes  Past Psychiatric History: Patient has a past history of treatment for depression and alcohol abuse.  Has been in several alcohol programs in the past but not in a long time.  He has a history  of withdrawal seizures but no history of delirium tremens.  He has been on antidepressants in the past but has no memory of it and has no idea whether they were ever effective.  Has threatened suicide in the past but has not acted on it.  Is the patient at risk to self? Yes.    Has the patient been a risk to self in the past 6 months? Yes.    Has the patient been a risk to self within the distant past? Yes.    Is the patient a risk to others? No.  Has the patient been a risk to others in the past 6 months? No.  Has the patient been a risk to others within the distant past? No.   [place] Scale:  [place] Admission (Current) from 07/20/2022 in [place] Most recent reading at 07/20/2022  5:53 PM ED from 07/20/2022 in [facility] Emergency Department at [facility] Most recent reading at 07/20/2022  9:40 AM ED from 07/06/2022 in [facility] Emergency Department at [facility] Most recent reading at 07/06/2022  8:29 AM  C-SSRS RISK CATEGORY High Risk High Risk No Risk        Prior Inpatient Therapy: Yes.   If yes, describe last known was in 2016 Prior Outpatient Therapy: Yes.   If yes, describe has had outpatient and residential substance use treatment  Alcohol Screening: 1. How often do you have a drink containing alcohol?: 2 to 4 times a month 2. How many drinks containing alcohol do you have on a typical day when you are drinking?: 10 or more 3. How often do you have six or  more drinks on one occasion?: Weekly AUDIT-C Score: 9 4. How often during the last year have you found that you were not able to stop drinking once you had started?: Monthly 5. How often during the last year have you failed to do what was normally expected from you because of drinking?: Monthly 6. How often during the last year have you needed a first drink in the morning to get yourself going after a heavy drinking session?: Less than monthly 7. How often during the last year have you  had a feeling of guilt of remorse after drinking?: Weekly 8. How often during the last year have you been unable to remember what happened the night before because you had been drinking?: Weekly 9. Have you or someone else been injured as a result of your drinking?: No 10. Has a relative or friend or a doctor or another health worker been concerned about your drinking or suggested you cut down?: Yes, during the last year Alcohol Use Disorder Identification Test Final Score (AUDIT): 24 Alcohol Brief Interventions/Follow-up: Alcohol education/Brief advice Substance Abuse History in the last 12 months:  Yes.   Consequences of Substance Abuse: History of seizures.  Worsening of depression.  General fatigue. Previous Psychotropic Medications: Yes  Psychological Evaluations: Yes  Past Medical History:  Past Medical History:  Diagnosis Date   Hypertension    Pneumothorax 12/2014    Past Surgical History:  Procedure Laterality Date   APPENDECTOMY     Family History:  Family History  Problem Relation Age of Onset   Diabetes Maternal Grandmother    Family Psychiatric  History: Denies knowing of any Tobacco Screening:  Social History   Tobacco Use  Smoking Status Every Day   Packs/day: 0.50   Years: 35.00   Total pack years: 17.50   Types: Cigarettes  Smokeless Tobacco Never    BH Tobacco Counseling     Are you interested in Tobacco Cessation Medications?  No, patient refused Counseled patient on smoking cessation:  Refused/Declined practical counseling Reason Tobacco Screening Not Completed: No value filed.       Social History:  Social History   Substance and Sexual Activity  Alcohol Use Yes   Comment: 80 oz beer daily, 1/2 pint liquor daily     Social History   Substance and Sexual Activity  Drug Use No    Additional Social History:                           Allergies:  No Known Allergies Lab Results:  Results for orders placed or performed during the  hospital encounter of 07/20/22 (from the past 48 hour(s))  Urine Drug Screen, Qualitative     Status: None   Collection Time: 07/20/22  9:50 AM  Result Value Ref Range   Tricyclic, Ur Screen NONE DETECTED NONE DETECTED   Amphetamines, Ur Screen NONE DETECTED NONE DETECTED   MDMA (Ecstasy)Ur Screen NONE DETECTED NONE DETECTED   Cocaine Metabolite,Ur [place] NONE DETECTED NONE DETECTED   Opiate, Ur Screen NONE DETECTED NONE DETECTED   Phencyclidine (PCP) Ur S NONE DETECTED NONE DETECTED   Cannabinoid 50 Ng, Ur  NONE DETECTED NONE DETECTED   Barbiturates, Ur Screen NONE DETECTED NONE DETECTED   Benzodiazepine, Ur Scrn NONE DETECTED NONE DETECTED   Methadone Scn, Ur NONE DETECTED NONE DETECTED    Comment: (NOTE) Tricyclics + metabolites, urine    Cutoff 1000 ng/mL Amphetamines + metabolites, urine  Cutoff 1000  ng/mL MDMA (Ecstasy), urine              Cutoff 500 ng/mL Cocaine Metabolite, urine          Cutoff 300 ng/mL Opiate + metabolites, urine        Cutoff 300 ng/mL Phencyclidine (PCP), urine         Cutoff 25 ng/mL Cannabinoid, urine                 Cutoff 50 ng/mL Barbiturates + metabolites, urine  Cutoff 200 ng/mL Benzodiazepine, urine              Cutoff 200 ng/mL Methadone, urine                   Cutoff 300 ng/mL  The urine drug screen provides only a preliminary, unconfirmed analytical test result and should not be used for non-medical purposes. Clinical consideration and professional judgment should be applied to any positive drug screen result due to possible interfering substances. A more specific alternate chemical method must be used in order to obtain a confirmed analytical result. Gas chromatography / mass spectrometry (GC/MS) is the preferred confirm atory method. Performed at [facility], [address] [place], [place] [address]   Comprehensive metabolic panel     Status: Abnormal   Collection Time: 07/20/22  9:58 AM  Result Value Ref Range   Sodium  126 (L) 135 - 145 mmol/L   Potassium 4.6 3.5 - 5.1 mmol/L   Chloride 90 (L) 98 - 111 mmol/L   CO2 24 22 - 32 mmol/L   Glucose, Bld 129 (H) 70 - 99 mg/dL    Comment: Glucose reference range applies only to samples taken after fasting for at least 8 hours.   BUN 12 6 - 20 mg/dL   Creatinine, Ser 0.92 0.61 - 1.24 mg/dL   Calcium 9.6 8.9 - 10.3 mg/dL   Total Protein 8.1 6.5 - 8.1 g/dL   Albumin 4.6 3.5 - 5.0 g/dL   AST 96 (H) 15 - 41 U/L   ALT 43 0 - 44 U/L   Alkaline Phosphatase 166 (H) 38 - 126 U/L   Total Bilirubin 5.3 (H) 0.3 - 1.2 mg/dL   GFR, Estimated >60 >60 mL/min    Comment: (NOTE) Calculated using the CKD-EPI Creatinine Equation (2021)    Anion gap 12 5 - 15    Comment: Performed at [facility], [address] [place], [place] [address]  Ethanol     Status: None   Collection Time: 07/20/22  9:58 AM  Result Value Ref Range   Alcohol, Ethyl (B) <10 <10 mg/dL    Comment: (NOTE) Lowest detectable limit for serum alcohol is 10 mg/dL.  For medical purposes only. Performed at [facility], [address] [place], [place] [id number]   Salicylate level     Status: Abnormal   Collection Time: 07/20/22  9:58 AM  Result Value Ref Range   Salicylate Lvl [id number] (L) 7.0 - 30.0 mg/dL    Comment: Performed at [facility], [address] [place], [place] [address]  Acetaminophen level     Status: Abnormal   Collection Time: 07/20/22  9:58 AM  Result Value Ref Range   Acetaminophen (Tylenol), Serum <10 (L) 10 - 30 ug/mL    Comment: (NOTE) Therapeutic concentrations vary significantly. A range of 10-30 ug/mL  may be an effective concentration for many patients. However, some  are best treated at concentrations outside of  this range. Acetaminophen concentrations >150 ug/mL at 4 hours after ingestion  and >50 ug/mL at 12 hours after ingestion are often associated with  toxic reactions.  Performed at [facility], [address]  [place], [place] [address]   cbc     Status: None   Collection Time: 07/20/22  9:58 AM  Result Value Ref Range   WBC 8.9 4.0 - 10.5 K/uL   RBC 4.62 4.22 - 5.81 MIL/uL   Hemoglobin 14.7 13.0 - 17.0 g/dL   HCT 41.1 39.0 - 52.0 %   MCV 89.0 80.0 - 100.0 fL   MCH 31.8 26.0 - 34.0 pg   MCHC 35.8 30.0 - 36.0 g/dL   RDW 12.1 11.5 - 15.5 %   Platelets 254 150 - 400 K/uL   nRBC 0.0 0.0 - 0.2 %    Comment: Performed at [facility], [address] [place], [place] [id number]  Basic metabolic panel     Status: Abnormal   Collection Time: 07/20/22 11:40 AM  Result Value Ref Range   Sodium 128 (L) 135 - 145 mmol/L   Potassium 4.8 3.5 - 5.1 mmol/L   Chloride 96 (L) 98 - 111 mmol/L   CO2 24 22 - 32 mmol/L   Glucose, Bld 105 (H) 70 - 99 mg/dL    Comment: Glucose reference range applies only to samples taken after fasting for at least 8 hours.   BUN 11 6 - 20 mg/dL   Creatinine, Ser 0.80 0.61 - 1.24 mg/dL   Calcium 8.9 8.9 - 10.3 mg/dL   GFR, Estimated >60 >60 mL/min    Comment: (NOTE) Calculated using the CKD-EPI Creatinine Equation (2021)    Anion gap 8 5 - 15    Comment: Performed at [facility], [address] [place], [place] [address]  Resp panel by RT-PCR (RSV, Flu A&B, Covid) Anterior Nasal Swab     Status: None   Collection Time: 07/20/22 11:50 AM   Specimen: Anterior Nasal Swab  Result Value Ref Range   SARS Coronavirus 2 by RT PCR NEGATIVE NEGATIVE    Comment: (NOTE) SARS-CoV-2 target nucleic acids are NOT DETECTED.  The SARS-CoV-2 RNA is generally detectable in upper respiratory specimens during the acute phase of infection. The lowest concentration of SARS-CoV-2 viral copies this assay can detect is 138 copies/mL. A negative result does not preclude SARS-Cov-2 infection and should not be used as the sole basis for treatment or other patient management decisions. A negative result may occur with  improper specimen collection/handling, submission of specimen  other than nasopharyngeal swab, presence of viral mutation(s) within the areas targeted by this assay, and inadequate number of viral copies(<138 copies/mL). A negative result must be combined with clinical observations, patient history, and epidemiological information. The expected result is Negative.  Fact Sheet for Patients:  EntrepreneurPulse.com.au  Fact Sheet for Healthcare Providers:  IncredibleEmployment.be  This test is no t yet approved or cleared by the [place] FDA and  has been authorized for detection and/or diagnosis of SARS-CoV-2 by FDA under an Emergency Use Authorization (EUA). This EUA will remain  in effect (meaning this test can be used) for the duration of the COVID-19 declaration under Section 564(b)(1) of the Act, 21 U.S.C.section 360bbb-3(b)(1), unless the authorization is terminated  or revoked sooner.       Influenza A by PCR NEGATIVE NEGATIVE   Influenza B by PCR NEGATIVE NEGATIVE    Comment: (NOTE) The Xpert Xpress SARS-CoV-2/FLU/RSV plus assay is  intended as an aid in the diagnosis of influenza from Nasopharyngeal swab specimens and should not be used as a sole basis for treatment. Nasal washings and aspirates are unacceptable for Xpert Xpress SARS-CoV-2/FLU/RSV testing.  Fact Sheet for Patients: EntrepreneurPulse.com.au  Fact Sheet for Healthcare Providers: IncredibleEmployment.be  This test is not yet approved or cleared by the [place] FDA and has been authorized for detection and/or diagnosis of SARS-CoV-2 by FDA under an Emergency Use Authorization (EUA). This EUA will remain in effect (meaning this test can be used) for the duration of the COVID-19 declaration under Section 564(b)(1) of the Act, 21 U.S.C. section 360bbb-3(b)(1), unless the authorization is terminated or revoked.     Resp Syncytial Virus by PCR NEGATIVE NEGATIVE    Comment: (NOTE) Fact  Sheet for Patients: EntrepreneurPulse.com.au  Fact Sheet for Healthcare Providers: IncredibleEmployment.be  This test is not yet approved or cleared by the [place] FDA and has been authorized for detection and/or diagnosis of SARS-CoV-2 by FDA under an Emergency Use Authorization (EUA). This EUA will remain in effect (meaning this test can be used) for the duration of the COVID-19 declaration under Section 564(b)(1) of the Act, 21 U.S.C. section 360bbb-3(b)(1), unless the authorization is terminated or revoked.  Performed at [facility], [address] [place], [place] [id number]   Basic metabolic panel     Status: Abnormal   Collection Time: 07/20/22 12:54 PM  Result Value Ref Range   Sodium 130 (L) 135 - 145 mmol/L   Potassium 4.0 3.5 - 5.1 mmol/L   Chloride 99 98 - 111 mmol/L   CO2 23 22 - 32 mmol/L   Glucose, Bld 113 (H) 70 - 99 mg/dL    Comment: Glucose reference range applies only to samples taken after fasting for at least 8 hours.   BUN 10 6 - 20 mg/dL   Creatinine, Ser 0.77 0.61 - 1.24 mg/dL   Calcium 8.8 (L) 8.9 - 10.3 mg/dL   GFR, Estimated >60 >60 mL/min    Comment: (NOTE) Calculated using the CKD-EPI Creatinine Equation (2021)    Anion gap 8 5 - 15    Comment: Performed at [facility], [address] [place], [place] [address]    Blood Alcohol level:  Lab Results  Component Value Date   [facility] <10 07/20/2022   ETH <10 0000000    Metabolic Disorder Labs:  Lab Results  Component Value Date   HGBA1C 4.4 12/22/2014   No results found for: "PROLACTIN" No results found for: "CHOL", "TRIG", "HDL", "CHOLHDL", "VLDL", "LDLCALC"  Current Medications: Current Facility-Administered Medications  Medication Dose Route Frequency Provider Last Rate Last Admin   acetaminophen (TYLENOL) tablet 650 mg  650 mg Oral Q6H PRN Bennett, Christal H, NP       alum & mag hydroxide-simeth (MAALOX/MYLANTA)  200-200-20 MG/5ML suspension 30 mL  30 mL Oral Q4H PRN Bennett, Christal H, NP       amLODipine (NORVASC) tablet 5 mg  5 mg Oral Daily Lua Feng T, MD       diphenhydrAMINE (BENADRYL) capsule 50 mg  50 mg Oral BID PRN Bennett, Christal H, NP       Or   diphenhydrAMINE (BENADRYL) injection 50 mg  50 mg Intramuscular BID PRN Bennett, Christal H, NP       escitalopram (LEXAPRO) tablet 10 mg  10 mg Oral Daily Miela Desjardin T, MD       haloperidol (HALDOL) tablet 5 mg  5 mg Oral BID  PRN Richardson Landry, Christal H, NP       Or   haloperidol lactate (HALDOL) injection 5 mg  5 mg Intramuscular BID PRN Bennett, Christal H, NP       LORazepam (ATIVAN) tablet 2 mg  2 mg Oral BID PRN Bennett, Christal H, NP       Or   LORazepam (ATIVAN) injection 2 mg  2 mg Intramuscular BID PRN Bennett, Christal H, NP       magnesium hydroxide (MILK OF MAGNESIA) suspension 30 mL  30 mL Oral Daily PRN Bennett, Christal H, NP       nicotine (NICODERM CQ - dosed in mg/24 hours) patch 21 mg  21 mg Transdermal Daily Caela Huot T, MD       nicotine polacrilex (NICORETTE) gum 2 mg  2 mg Oral PRN Yahel Fuston, Madie Reno, MD       PTA Medications: Medications Prior to Admission  Medication Sig Dispense Refill Last Dose   amLODipine (NORVASC) 5 MG tablet Take 1 tablet (5 mg total) by mouth daily. 30 tablet 11 Past Month   levETIRAcetam (KEPPRA) 500 MG tablet Take 1 tablet (500 mg total) by mouth 2 (two) times daily. (Patient not taking: Reported on 07/20/2022) 60 tablet 5 Not Taking    Musculoskeletal: Strength & Muscle Tone: within normal limits Gait & Station: normal Patient leans: N/A            Psychiatric Specialty Exam:  Presentation  General Appearance: No data recorded Eye Contact:No data recorded Speech:No data recorded Speech Volume:No data recorded Handedness:No data recorded  Mood and Affect  Mood:No data recorded Affect:No data recorded  Thought Process  Thought Processes:No data recorded Duration  of Psychotic Symptoms:   Past Diagnosis of Schizophrenia or Psychoactive disorder: No data recorded Descriptions of Associations:No data recorded Orientation:No data recorded Thought Content:No data recorded Hallucinations:No data recorded Ideas of Reference:No data recorded Suicidal Thoughts:No data recorded Homicidal Thoughts:No data recorded  Sensorium  Memory:No data recorded Judgment:No data recorded Insight:No data recorded  Executive Functions  Concentration:No data recorded Attention Span:No data recorded Recall:No data recorded Fund of Knowledge:No data recorded Language:No data recorded  Psychomotor Activity  Psychomotor Activity:No data recorded  Assets  Assets:No data recorded  Sleep  Sleep:No data recorded   Physical Exam: Physical Exam Vitals and nursing note reviewed.  Constitutional:      Appearance: Normal appearance.  HENT:     Head: Normocephalic and atraumatic.     Mouth/Throat:     Pharynx: Oropharynx is clear.  Eyes:     Pupils: Pupils are equal, round, and reactive to light.  Cardiovascular:     Rate and Rhythm: Normal rate and regular rhythm.  Pulmonary:     Effort: Pulmonary effort is normal.     Breath sounds: Normal breath sounds.  Abdominal:     General: Abdomen is flat.     Palpations: Abdomen is soft.  Musculoskeletal:        General: Normal range of motion.  Skin:    General: Skin is warm and dry.  Neurological:     General: No focal deficit present.     Mental Status: He is alert. Mental status is at baseline.  Psychiatric:        Attention and Perception: Attention normal.        Mood and Affect: Mood is depressed. Affect is blunt.        Speech: Speech normal.        Behavior: Behavior is slowed.  Thought Content: Thought content includes suicidal ideation. Thought content does not include suicidal plan.        Cognition and Memory: Cognition normal.        Judgment: Judgment normal.    Review of Systems   Constitutional: Negative.   HENT: Negative.    Eyes: Negative.   Respiratory: Negative.    Cardiovascular: Negative.   Gastrointestinal: Negative.   Musculoskeletal: Negative.   Skin: Negative.   Neurological: Negative.   Psychiatric/Behavioral:  Positive for depression, substance abuse and suicidal ideas. Negative for hallucinations. The patient is nervous/anxious and has insomnia.    Blood pressure (!) 126/112, pulse (!) 105, temperature 98.6 F (37 C), temperature source Oral, resp. rate 18, height '5\' 11"'$  (1.803 m), weight 74.8 kg, SpO2 100 %. Body mass index is 23.01 kg/m.  Treatment Plan Summary: Medication management and Plan reviewed treatment plan with patient.  Continue to monitor for signs of withdrawal although at this point he is probably passing the window of DTs and is out of the window for seizures.  No obvious physical withdrawal.  He does have elevated blood pressure but has a history of hypertension and that will be treated by restarting amlodipine.  Patient will talk with full treatment team and we can look into whether there are residential substance abuse options that would be available.  Treat for nicotine dependence.  Recommend restarting antidepressant initiate Lexapro.  Engage in individual and group therapy.  Observation Level/Precautions:  15 minute checks  Laboratory:  Chemistry Profile  Psychotherapy:    Medications:    Consultations:    Discharge Concerns:    Estimated LOS:  Other:     Physician Treatment Plan for Primary Diagnosis: MDD (major depressive disorder) Long Term Goal(s): Improvement in symptoms so as ready for discharge  Short Term Goals: Ability to disclose and discuss suicidal ideas and Ability to demonstrate self-control will improve  Physician Treatment Plan for Secondary Diagnosis: Principal Problem:   MDD (major depressive disorder) Active Problems:   Alcohol use disorder, severe, dependence ([place])   Tobacco use disorder   HTN  (hypertension), benign  Long Term Goal(s): Improvement in symptoms so as ready for discharge  Short Term Goals: Ability to maintain clinical measurements within normal limits will improve, Compliance with prescribed medications will improve, and Ability to identify triggers associated with substance abuse/mental health issues will improve  I certify that inpatient services furnished can reasonably be expected to improve the patient's condition.    Alethia Berthold, MD 3/7/202410:28 AM

## 2022-07-21 NOTE — BHH Counselor (Signed)
Adult Comprehensive Assessment  Patient ID: Matthew [name], male   DOB: 05-13-1961, 62 y.o.   MRN: [id number]  Information Source: Information source: Patient  Current Stressors:  Patient states their primary concerns and needs for treatment are:: "depression" Patient states their goals for this hospitilization and ongoing recovery are:: "try and get help for depression" Educational / Learning stressors: Pt denies. Employment / Job issues: Pt denies. Family Relationships: Pt denies. Financial / Lack of resources (include bankruptcy): Pt denies. Housing / Lack of housing: "somewhat" Physical health (include injuries & life threatening diseases): "high blood pressure" Social relationships: Pt denies. Substance abuse: "Alcohol" Bereavement / Loss: Pt denies.  Living/Environment/Situation:  Living Arrangements: Non-relatives/Friends Who else lives in the home?: "friend" How long has patient lived in current situation?: "3 years" What is atmosphere in current home: Other (Comment) ("intense sometimes, hostile")  Family History:  Marital status: Divorced Divorced, when?: "a long time" What types of issues is patient dealing with in the relationship?: Patient declined to provide details. Does patient have children?: Yes How is patient's relationship with their children?: "I talk to them every now and then and it's pretty decent"  Childhood History:  By whom was/is the patient raised?: Grandparents Description of patient's relationship with caregiver when they were a child: "loved them, they were like my parents" Patient's description of current relationship with people who raised him/her: Pt reports that grandparents are deceased. How were you disciplined when you got in trouble as a child/adolescent?: "spanking" Does patient have siblings?: Yes Number of Siblings: 5 Description of patient's current relationship with siblings: "I talk to them" Did patient suffer any  verbal/emotional/physical/sexual abuse as a child?: No Did patient suffer from severe childhood neglect?: Yes Patient description of severe childhood neglect: "I mean we were poor" Has patient ever been sexually abused/assaulted/raped as an adolescent or adult?: No Was the patient ever a victim of a crime or a disaster?: No Witnessed domestic violence?: Yes Has patient been affected by domestic violence as an adult?: No Description of domestic violence: "I've seen people fight"  Education:  Highest grade of school patient has completed: "12th grade" Currently a student?: No Learning disability?: No  Employment/Work Situation:   Employment Situation: Employed Where is Patient Currently Employed?: [employer]" How Long has Patient Been Employed?: "a month" Are You Satisfied With Your Job?: Yes Do You Work More Than One Job?: No Work Stressors: Pt denies. Patient's Job has Been Impacted by Current Illness: No What is the Longest Time Patient has Held a Job?: "20 years" Where was the Patient Employed at that Time?: "[employer]" Has Patient ever Been in the [employer]?: No  Financial Resources:   Financial resources: Income from employment Does patient have a representative payee or guardian?: No  Alcohol/Substance Abuse:   What has been your use of drugs/alcohol within the last 12 months?: Alcohol: "daily, a 12 pack to a case" Last use use was day before yesterday If attempted suicide, did drugs/alcohol play a role in this?: No Alcohol/Substance Abuse Treatment Hx: Denies past history Has alcohol/substance abuse ever caused legal problems?: Yes ("Drinking, DUI")  Social Support System:   Patient's Community Support System: None Describe Community Support System: Pt denies. Type of faith/religion: "Baptist" How does patient's faith help to cope with current illness?: "a lot of praying"  Leisure/Recreation:   Do You Have Hobbies?: No  Strengths/Needs:   What is the  patient's perception of their strengths?: "I really don't know" Patient states they can use these  personal strengths during their treatment to contribute to their recovery: Pt denies. Patient states these barriers may affect/interfere with their treatment: Pt denies. Patient states these barriers may affect their return to the community: Pt denies.  Discharge Plan:   Currently receiving community mental health services: No Patient states concerns and preferences for aftercare planning are: Pt states that he is unsure of what he would like his aftercare look like. Patient states they will know when they are safe and ready for discharge when: "when I'm gone" Does patient have access to transportation?: No Does patient have financial barriers related to discharge medications?: Yes Patient description of barriers related to discharge medications: Chart indicates that patient does not have insurance. Plan for no access to transportation at discharge: CSW to assist with transportation needs. Will patient be returning to same living situation after discharge?: Yes  Summary/Recommendations:   Summary and Recommendations (to be completed by the evaluator): Patient is a 62 year old male from [place].  Patient presents to the hospital with suicidal ideations with a plan and reported increasing depression.  Patient reports a plan to use his knives or to overdose on his blood pressure medication.  Patient reports that he feels that drinking "controls" his life.  He reports feeling that alcohol use affects his job, his living situation and his relationships.  He reports that he currently drinks between a 12-pack and a case of beer a night.  Patient did have recent treatment to address his alcohol use, however, was released due to drinking.  Patient reports additional trigger to his drinking is his current living situation, he reports that there is drinking in the home.  He reports that he does talk to his  children as well as with his siblings.  Patient reports that he does not have a current mental health provider or substance use provider.  He also reports that he isn't sure at this time what he would like for his aftercare to look like.  Recommendations include: crisis stabilization, therapeutic milieu, encourage group attendance and participation, medication management for mood stabilization and development of comprehensive mental wellness/sobriety plan.  Rozann Lesches. 07/21/2022

## 2022-07-21 NOTE — Plan of Care (Signed)
  Problem: Education: Goal: Knowledge of General Education information will improve Description: Including pain rating scale, medication(s)/side effects and non-pharmacologic comfort measures Outcome: Not Progressing   Problem: Health Behavior/Discharge Planning: Goal: Ability to manage health-related needs will improve Outcome: Not Progressing   Problem: Clinical Measurements: Goal: Ability to maintain clinical measurements within normal limits will improve Outcome: Not Progressing Goal: Will remain free from infection Outcome: Not Progressing Goal: Diagnostic test results will improve Outcome: Not Progressing Goal: Respiratory complications will improve Outcome: Not Progressing Goal: Cardiovascular complication will be avoided Outcome: Not Progressing   Problem: Activity: Goal: Risk for activity intolerance will decrease Outcome: Not Progressing   Problem: Nutrition: Goal: Adequate nutrition will be maintained Outcome: Not Progressing   Problem: Coping: Goal: Level of anxiety will decrease Outcome: Not Progressing   Problem: Elimination: Goal: Will not experience complications related to bowel motility Outcome: Not Progressing Goal: Will not experience complications related to urinary retention Outcome: Not Progressing   Problem: Pain Managment: Goal: General experience of comfort will improve Outcome: Not Progressing   Problem: Safety: Goal: Ability to remain free from injury will improve Outcome: Not Progressing   Problem: Skin Integrity: Goal: Risk for impaired skin integrity will decrease Outcome: Not Progressing   Problem: Education: Goal: Knowledge of [place] General Education information/materials will improve Outcome: Not Progressing Goal: Emotional status will improve Outcome: Not Progressing Goal: Mental status will improve Outcome: Not Progressing Goal: Verbalization of understanding the information provided will improve Outcome: Not  Progressing   Problem: Safety: Goal: Periods of time without injury will increase Outcome: Not Progressing   Problem: Health Behavior/Discharge Planning: Goal: Identification of resources available to assist in meeting health care needs will improve Outcome: Not Progressing   Problem: Medication: Goal: Compliance with prescribed medication regimen will improve Outcome: Not Progressing   Problem: Coping: Goal: Coping ability will improve Outcome: Not Progressing Goal: Will verbalize feelings Outcome: Not Progressing   Problem: Self-Concept: Goal: Level of anxiety will decrease Outcome: Not Progressing

## 2022-07-21 NOTE — Group Note (Signed)
Recreation Therapy Group Note   Group Topic:Emotion Expression  Group Date: 07/21/2022 Start Time: 1000 End Time: 1100 Facilitators: Vilma Prader, LRT, CTRS Location:  Craft Room  Group Description: Gratitude Journaling. Patients and LRT discussed what gratitude means, how we can express it and what it means to [place], personally. LRT gave an education handout on the definition of gratitude that also gave different examples of gratitude exercises that they could try. One of the examples was "Gratitude Letter", which prompted you to write a letter to someone you appreciate. LRT played soft music while everyone wrote their letter. Once letter was completed, LRT encouraged people to read their letter, if they wanted to, or share who they wrote it to, at minimum. LRT and pts processed how showing gratitude towards themselves, and others can be applied to everyday life post-discharge.   Affect/Mood: N/A   Participation Level: Did not attend    Clinical Observations/Individualized Feedback: Matthew [name] did not attend group due to resting in his room.  Plan: Continue to engage patient in RT group sessions 2-3x/week.   Vilma Prader, LRT, CTRS 07/21/2022 11:32 AM

## 2022-07-21 NOTE — BHH Suicide Risk Assessment (Signed)
[facility] Admission Suicide Risk Assessment   Nursing information obtained from:  Patient Demographic factors:  Male, Low socioeconomic status, Living alone Current Mental Status:  Suicidal ideation indicated by patient Loss Factors:  NA Historical Factors:  NA Risk Reduction Factors:  Employed  Total Time spent with patient: 45 minutes Principal Problem: MDD (major depressive disorder) Diagnosis:  Principal Problem:   MDD (major depressive disorder) Active Problems:   Alcohol use disorder, severe, dependence ([place])   Tobacco use disorder   HTN (hypertension), benign  Subjective Data: Patient seen and chart reviewed.  62 year old man with a history of depression and alcohol abuse.  Came in saying he was having suicidal thoughts with thoughts of overdosing.  Had not acted on it.  Continues to voice multiple symptoms of depression and concern about heavy alcohol abuse.  Passive suicidal thoughts with no intention or plan of acting on it and ability to positively engage in appropriate treatment planning.  No psychosis.  Continued Clinical Symptoms:  Alcohol Use Disorder Identification Test Final Score (AUDIT): 24 The "Alcohol Use Disorders Identification Test", Guidelines for Use in Primary Care, Second Edition.  World Pharmacologist [facility]). Score between 0-7:  no or low risk or alcohol related problems. Score between 8-15:  moderate risk of alcohol related problems. Score between 16-19:  high risk of alcohol related problems. Score 20 or above:  warrants further diagnostic evaluation for alcohol dependence and treatment.   CLINICAL FACTORS:   Depression:   Comorbid alcohol abuse/dependence Alcohol/Substance Abuse/Dependencies   Musculoskeletal: Strength & Muscle Tone: within normal limits Gait & Station: normal Patient leans: N/A  Psychiatric Specialty Exam:  Presentation  General Appearance: No data recorded Eye Contact:No data recorded Speech:No data recorded Speech  Volume:No data recorded Handedness:No data recorded  Mood and Affect  Mood:No data recorded Affect:No data recorded  Thought Process  Thought Processes:No data recorded Descriptions of Associations:No data recorded Orientation:No data recorded Thought Content:No data recorded History of Schizophrenia/Schizoaffective disorder:No data recorded Duration of Psychotic Symptoms:No data recorded Hallucinations:No data recorded Ideas of Reference:No data recorded Suicidal Thoughts:No data recorded Homicidal Thoughts:No data recorded  Sensorium  Memory:No data recorded Judgment:No data recorded Insight:No data recorded  Executive Functions  Concentration:No data recorded Attention Span:No data recorded Recall:No data recorded Fund of Knowledge:No data recorded Language:No data recorded  Psychomotor Activity  Psychomotor Activity:No data recorded  Assets  Assets:No data recorded  Sleep  Sleep:No data recorded   Physical Exam: Physical Exam Vitals and nursing note reviewed.  Constitutional:      Appearance: Normal appearance.  HENT:     Head: Normocephalic and atraumatic.     Mouth/Throat:     Pharynx: Oropharynx is clear.  Eyes:     Pupils: Pupils are equal, round, and reactive to light.  Cardiovascular:     Rate and Rhythm: Normal rate and regular rhythm.  Pulmonary:     Effort: Pulmonary effort is normal.     Breath sounds: Normal breath sounds.  Abdominal:     General: Abdomen is flat.     Palpations: Abdomen is soft.  Musculoskeletal:        General: Normal range of motion.  Skin:    General: Skin is warm and dry.  Neurological:     General: No focal deficit present.     Mental Status: He is alert. Mental status is at baseline.  Psychiatric:        Attention and Perception: Attention normal.        Mood and Affect:  Mood is depressed. Affect is blunt.        Speech: Speech is delayed.        Behavior: Behavior is slowed.        Thought Content: Thought  content includes suicidal ideation. Thought content does not include suicidal plan.        Cognition and Memory: Cognition normal.        Judgment: Judgment normal.    Review of Systems  Constitutional: Negative.   HENT: Negative.    Eyes: Negative.   Respiratory: Negative.    Cardiovascular: Negative.   Gastrointestinal: Negative.   Musculoskeletal: Negative.   Skin: Negative.   Neurological: Negative.   Psychiatric/Behavioral:  Positive for depression, substance abuse and suicidal ideas. Negative for hallucinations. The patient is nervous/anxious and has insomnia.    Blood pressure (!) 126/112, pulse (!) 105, temperature 98.6 F (37 C), temperature source Oral, resp. rate 18, height '5\' 11"'$  (1.803 m), weight 74.8 kg, SpO2 100 %. Body mass index is 23.01 kg/m.   COGNITIVE FEATURES THAT CONTRIBUTE TO RISK:  Thought constriction (tunnel vision)    SUICIDE RISK:   Minimal: No identifiable suicidal ideation.  Patients presenting with no risk factors but with morbid ruminations; may be classified as minimal risk based on the severity of the depressive symptoms  PLAN OF CARE: Continue daily engagement in individual and group therapy.  Treatment team meeting tomorrow.  Initiate appropriate medications.  Work on follow-up for substance abuse management.  Ongoing assessment of dangerousness prior to discharge  I certify that inpatient services furnished can reasonably be expected to improve the patient's condition.   Alethia Berthold, MD 07/21/2022, 10:25 AM

## 2022-07-21 NOTE — Progress Notes (Signed)
Patient calm and pleasant during assessment denying HI/AVH. Pt endorses passive SI, verbally contracts for safety. Pt observed interacting appropriately with staff and peers on the unit. Pt didn't have any medication scheduled tonight and hasn't requested anything PRN as of now. Pt given education, support, and encouragement to be active in his treatment plan. Pt being monitored Q 15 minutes for safety per unit protocol, remains safe on the unit

## 2022-07-22 DIAGNOSIS — F332 Major depressive disorder, recurrent severe without psychotic features: Secondary | ICD-10-CM | POA: Diagnosis not present

## 2022-07-22 MED ORDER — TRAZODONE HCL 100 MG PO TABS
100.0000 mg | ORAL_TABLET | Freq: Every evening | ORAL | Status: DC | PRN
  Administered 2022-07-22 – 2022-07-27 (×6): 100 mg via ORAL
  Filled 2022-07-22 (×6): qty 1

## 2022-07-22 MED ORDER — FAMOTIDINE 20 MG PO TABS
20.0000 mg | ORAL_TABLET | Freq: Two times a day (BID) | ORAL | Status: DC
  Administered 2022-07-22 – 2022-07-28 (×12): 20 mg via ORAL
  Filled 2022-07-22 (×12): qty 1

## 2022-07-22 MED ORDER — HYDROXYZINE HCL 50 MG PO TABS
50.0000 mg | ORAL_TABLET | Freq: Four times a day (QID) | ORAL | Status: DC | PRN
  Administered 2022-07-22 – 2022-07-27 (×6): 50 mg via ORAL
  Filled 2022-07-22 (×8): qty 1

## 2022-07-22 NOTE — Plan of Care (Signed)
  Problem: Education: Goal: Knowledge of General Education information will improve Description: Including pain rating scale, medication(s)/side effects and non-pharmacologic comfort measures Outcome: Progressing   Problem: Nutrition: Goal: Adequate nutrition will be maintained Outcome: Progressing   Problem: Education: Goal: Knowledge of [place] General Education information/materials will improve Outcome: Progressing   Problem: Coping: Goal: Level of anxiety will decrease Outcome: Not Progressing   Problem: Pain Managment: Goal: General experience of comfort will improve Outcome: Not Progressing

## 2022-07-22 NOTE — Progress Notes (Signed)
Pt denies HI/AVH but endorses SI stating thoughts are on and off but verbally agrees to approach staff if these become apparent or before harming themselves/others. Rates depression 8/10. Rates anxiety 8/10. Rates pain 0/10. Pt has been in and out of his room. Pt has been going to groups and is now in the dayroom interacting well with others. Pt stated his goal is to stop drinking because that is the main thing that is causing his depression. Scheduled medications administered to pt, per MD orders. RN provided support and encouragement to pt. Q15 min safety checks implemented and continued. Pt safe on the unit. RN will continue to monitor and intervene as needed.  07/22/22 0825  Psych Admission Type (Psych Patients Only)  Admission Status Voluntary  Psychosocial Assessment  Patient Complaints Anxiety;Depression  Eye Contact Fair  Facial Expression Flat;Sad  Affect Anxious;Depressed  Speech Logical/coherent;Soft  Interaction Assertive  Motor Activity Slow  Appearance/Hygiene Unremarkable  Behavior Characteristics Cooperative;Appropriate to situation;Anxious  Mood Pleasant  Aggressive Behavior  Effect No apparent injury  Thought Process  Coherency WDL  Content WDL  Delusions None reported or observed  Perception WDL  Hallucination None reported or observed  Judgment Impaired  Confusion None  Danger to Self  Current suicidal ideation? Passive  Self-Injurious Behavior No self-injurious ideation or behavior indicators observed or expressed   Agreement Not to Harm Self Yes  Description of Agreement verbal  Danger to Others  Danger to Others None reported or observed

## 2022-07-22 NOTE — BHH Group Notes (Signed)
[facility] Group Notes:  (Nursing/MHT/Case Management/Adjunct)  Date:  07/22/2022  Time:  9:16 PM  Type of Therapy:  Group Therapy  Participation Level:  Active  Participation Quality:  Appropriate  Affect:  Appropriate  Cognitive:  Appropriate and Oriented  Insight:  Appropriate  Engagement in Group:  Engaged  Modes of Intervention:  Discussion  Summary of Progress/Problems:  Maglione,Saahas Hidrogo E 07/22/2022, 9:16 PM

## 2022-07-22 NOTE — Group Note (Unsigned)
Date:  07/22/2022 Time:  11:29 PM  Group Topic/Focus:  Coping With Mental Health Crisis:   The purpose of this group is to help patients identify strategies for coping with mental health crisis.  Group discusses possible causes of crisis and ways to manage them effectively.     Participation Level:  {BHH PARTICIPATION HD:996081  Participation Quality:  {BHH PARTICIPATION QUALITY:22265}  Affect:  {BHH AFFECT:22266}  Cognitive:  {BHH COGNITIVE:22267}  Insight: {BHH Insight2:20797}  Engagement in Group:  {BHH ENGAGEMENT IN [place]:[id number]  Modes of Intervention:  {BHH MODES OF INTERVENTION:22269}  Additional Comments:  ***  Matthew [name] 07/22/2022, 11:29 PM

## 2022-07-22 NOTE — Group Note (Signed)
Recreation Therapy Group Note   Group Topic:Leisure Education  Group Date: 07/22/2022 Start Time: 0945 End Time: 1115 Facilitators: Leona Carry, CTRS Location:  Craft Room  Group Description: Leisure. Patients were given the option to play [place], [place] or [place]. Pts decided to play [place] as a group. After multiple rounds, pts chose to also play [place] as a group. Pt's identified 2 of their leisure interests outside of the hospital and engaged in discussion on the importance of leisure activities in their daily life post-discharge.   Affect/Mood: Appropriate, Congruent, and Happy   Participation Level: Active and Engaged   Participation Quality: Independent   Behavior: Appropriate, Attentive , and Calm   Speech/Thought Process: Coherent   Insight: Good   Judgement: Good   Modes of Intervention: Competitive Play   Patient Response to Interventions:  Attentive, Engaged, Interested , and Receptive   Education Outcome:  Acknowledges education   Clinical Observations/Individualized Feedback: Daddy was active in their participation of session activities and group discussion. Pt identified "watching tv and listening to music" as things that he does in his free time outside of the hospital. Pt was present duration of group and had a very bright affect when interacting with peers and LRT.   Plan: Continue to engage patient in RT group sessions 2-3x/week.   Vilma Prader, LRT, CTRS 07/22/2022 11:28 AM

## 2022-07-22 NOTE — Progress Notes (Signed)
[facility] MD Progress Note  07/22/2022 10:27 AM Matthew [name]  MRN:  [id number] Subjective: Follow-up for a 62 year old man with major depression and alcohol abuse.  Patient met today and attended treatment team.  Denies suicidal intent but continues to have depressed mood.  Affect withdrawn and down but coming out of his room and participating appropriately.  Complains of GI heartburn and had some trouble sleeping. Principal Problem: MDD (major depressive disorder) Diagnosis: Principal Problem:   MDD (major depressive disorder) Active Problems:   Alcohol use disorder, severe, dependence ([place])   Tobacco use disorder   HTN (hypertension), benign  Total Time spent with patient: 30 minutes  Past Psychiatric History: Past history of depression and substance use  Past Medical History:  Past Medical History:  Diagnosis Date   Hypertension    Pneumothorax 12/2014    Past Surgical History:  Procedure Laterality Date   APPENDECTOMY     Family History:  Family History  Problem Relation Age of Onset   Diabetes Maternal Grandmother    Family Psychiatric  History: See previous Social History:  Social History   Substance and Sexual Activity  Alcohol Use Yes   Comment: 80 oz beer daily, 1/2 pint liquor daily     Social History   Substance and Sexual Activity  Drug Use No    Social History   Socioeconomic History   Marital status: Divorced    Spouse name: Not on file   Number of children: Not on file   Years of education: Not on file   Highest education level: Not on file  Occupational History   Not on file  Tobacco Use   Smoking status: Every Day    Packs/day: 0.50    Years: 35.00    Total pack years: 17.50    Types: Cigarettes   Smokeless tobacco: Never  Substance and Sexual Activity   Alcohol use: Yes    Comment: 80 oz beer daily, 1/2 pint liquor daily   Drug use: No   Sexual activity: Not Currently    Birth control/protection: None  Other Topics Concern   Not on file   Social History Narrative   Not on file   Social Determinants of Health   Financial Resource Strain: Not on file  Food Insecurity: No Food Insecurity (07/20/2022)   Hunger Vital Sign    Worried About Running Out of Food in the Last Year: Never true    Ran Out of Food in the Last Year: Never true  Transportation Needs: No Transportation Needs (07/20/2022)   PRAPARE - Hydrologist (Medical): No    Lack of Transportation (Non-Medical): No  Physical Activity: Not on file  Stress: Not on file  Social Connections: Not on file   Additional Social History:                         Sleep: Fair  Appetite:  Fair  Current Medications: Current Facility-Administered Medications  Medication Dose Route Frequency Provider Last Rate Last Admin   acetaminophen (TYLENOL) tablet 650 mg  650 mg Oral Q6H PRN Bennett, Christal H, NP       alum & mag hydroxide-simeth (MAALOX/MYLANTA) 200-200-20 MG/5ML suspension 30 mL  30 mL Oral Q4H PRN Bennett, Christal H, NP       amLODipine (NORVASC) tablet 5 mg  5 mg Oral Daily Eyoel Throgmorton T, MD   5 mg at 07/22/22 0825   escitalopram (LEXAPRO) tablet 10  mg  10 mg Oral Daily Hikari Tripp, Madie Reno, MD   10 mg at 07/22/22 0825   famotidine (PEPCID) tablet 20 mg  20 mg Oral BID Alexande Sheerin, Madie Reno, MD       hydrOXYzine (ATARAX) tablet 50 mg  50 mg Oral Q6H PRN Stpehanie Montroy T, MD       magnesium hydroxide (MILK OF MAGNESIA) suspension 30 mL  30 mL Oral Daily PRN Bennett, Christal H, NP       nicotine (NICODERM CQ - dosed in mg/24 hours) patch 21 mg  21 mg Transdermal Daily Ramelo Oetken, Madie Reno, MD       nicotine polacrilex (NICORETTE) gum 2 mg  2 mg Oral PRN Wilhemenia Camba, Madie Reno, MD       traZODone (DESYREL) tablet 100 mg  100 mg Oral QHS PRN Chaunte Hornbeck, Madie Reno, MD        Lab Results:  Results for orders placed or performed during the hospital encounter of 07/20/22 (from the past 48 hour(s))  Basic metabolic panel     Status: Abnormal   Collection  Time: 07/20/22 11:40 AM  Result Value Ref Range   Sodium 128 (L) 135 - 145 mmol/L   Potassium 4.8 3.5 - 5.1 mmol/L   Chloride 96 (L) 98 - 111 mmol/L   CO2 24 22 - 32 mmol/L   Glucose, Bld 105 (H) 70 - 99 mg/dL    Comment: Glucose reference range applies only to samples taken after fasting for at least 8 hours.   BUN 11 6 - 20 mg/dL   Creatinine, Ser 0.80 0.61 - 1.24 mg/dL   Calcium 8.9 8.9 - 10.3 mg/dL   GFR, Estimated >60 >60 mL/min    Comment: (NOTE) Calculated using the CKD-EPI Creatinine Equation (2021)    Anion gap 8 5 - 15    Comment: Performed at [facility], [address] [place], [place] [address]  Resp panel by RT-PCR (RSV, Flu A&B, Covid) Anterior Nasal Swab     Status: None   Collection Time: 07/20/22 11:50 AM   Specimen: Anterior Nasal Swab  Result Value Ref Range   SARS Coronavirus 2 by RT PCR NEGATIVE NEGATIVE    Comment: (NOTE) SARS-CoV-2 target nucleic acids are NOT DETECTED.  The SARS-CoV-2 RNA is generally detectable in upper respiratory specimens during the acute phase of infection. The lowest concentration of SARS-CoV-2 viral copies this assay can detect is 138 copies/mL. A negative result does not preclude SARS-Cov-2 infection and should not be used as the sole basis for treatment or other patient management decisions. A negative result may occur with  improper specimen collection/handling, submission of specimen other than nasopharyngeal swab, presence of viral mutation(s) within the areas targeted by this assay, and inadequate number of viral copies(<138 copies/mL). A negative result must be combined with clinical observations, patient history, and epidemiological information. The expected result is Negative.  Fact Sheet for Patients:  EntrepreneurPulse.com.au  Fact Sheet for Healthcare Providers:  IncredibleEmployment.be  This test is no t yet approved or cleared by the [place] FDA and  has  been authorized for detection and/or diagnosis of SARS-CoV-2 by FDA under an Emergency Use Authorization (EUA). This EUA will remain  in effect (meaning this test can be used) for the duration of the COVID-19 declaration under Section 564(b)(1) of the Act, 21 U.S.C.section 360bbb-3(b)(1), unless the authorization is terminated  or revoked sooner.       Influenza A by PCR NEGATIVE NEGATIVE   Influenza B by  PCR NEGATIVE NEGATIVE    Comment: (NOTE) The Xpert Xpress SARS-CoV-2/FLU/RSV plus assay is intended as an aid in the diagnosis of influenza from Nasopharyngeal swab specimens and should not be used as a sole basis for treatment. Nasal washings and aspirates are unacceptable for Xpert Xpress SARS-CoV-2/FLU/RSV testing.  Fact Sheet for Patients: EntrepreneurPulse.com.au  Fact Sheet for Healthcare Providers: IncredibleEmployment.be  This test is not yet approved or cleared by the [place] FDA and has been authorized for detection and/or diagnosis of SARS-CoV-2 by FDA under an Emergency Use Authorization (EUA). This EUA will remain in effect (meaning this test can be used) for the duration of the COVID-19 declaration under Section 564(b)(1) of the Act, 21 U.S.C. section 360bbb-3(b)(1), unless the authorization is terminated or revoked.     Resp Syncytial Virus by PCR NEGATIVE NEGATIVE    Comment: (NOTE) Fact Sheet for Patients: EntrepreneurPulse.com.au  Fact Sheet for Healthcare Providers: IncredibleEmployment.be  This test is not yet approved or cleared by the [place] FDA and has been authorized for detection and/or diagnosis of SARS-CoV-2 by FDA under an Emergency Use Authorization (EUA). This EUA will remain in effect (meaning this test can be used) for the duration of the COVID-19 declaration under Section 564(b)(1) of the Act, 21 U.S.C. section 360bbb-3(b)(1), unless the authorization is  terminated or revoked.  Performed at [facility], [address] [place], [place] [id number]   Basic metabolic panel     Status: Abnormal   Collection Time: 07/20/22 12:54 PM  Result Value Ref Range   Sodium 130 (L) 135 - 145 mmol/L   Potassium 4.0 3.5 - 5.1 mmol/L   Chloride 99 98 - 111 mmol/L   CO2 23 22 - 32 mmol/L   Glucose, Bld 113 (H) 70 - 99 mg/dL    Comment: Glucose reference range applies only to samples taken after fasting for at least 8 hours.   BUN 10 6 - 20 mg/dL   Creatinine, Ser 0.77 0.61 - 1.24 mg/dL   Calcium 8.8 (L) 8.9 - 10.3 mg/dL   GFR, Estimated >60 >60 mL/min    Comment: (NOTE) Calculated using the CKD-EPI Creatinine Equation (2021)    Anion gap 8 5 - 15    Comment: Performed at [facility], [address] [place], [place] [address]    Blood Alcohol level:  Lab Results  Component Value Date   [facility] <10 07/20/2022   ETH <10 0000000    Metabolic Disorder Labs: Lab Results  Component Value Date   HGBA1C 4.4 12/22/2014   No results found for: "PROLACTIN" No results found for: "CHOL", "TRIG", "HDL", "CHOLHDL", "VLDL", "LDLCALC"  Physical Findings: AIMS:  , ,  ,  ,    CIWA:    COWS:     Musculoskeletal: Strength & Muscle Tone: within normal limits Gait & Station: normal Patient leans: N/A  Psychiatric Specialty Exam:  Presentation  General Appearance: No data recorded Eye Contact:No data recorded Speech:No data recorded Speech Volume:No data recorded Handedness:No data recorded  Mood and Affect  Mood:No data recorded Affect:No data recorded  Thought Process  Thought Processes:No data recorded Descriptions of Associations:No data recorded Orientation:No data recorded Thought Content:No data recorded History of Schizophrenia/Schizoaffective disorder:No data recorded Duration of Psychotic Symptoms:No data recorded Hallucinations:No data recorded Ideas of Reference:No data recorded Suicidal Thoughts:No data  recorded Homicidal Thoughts:No data recorded  Sensorium  Memory:No data recorded Judgment:No data recorded Insight:No data recorded  Executive Functions  Concentration:No data recorded Attention Span:No data recorded  Recall:No data recorded Brilliant recorded Hector recorded  Psychomotor Activity  Psychomotor Activity:No data recorded  Assets  Assets:No data recorded  Sleep  Sleep:No data recorded   Physical Exam: Physical Exam Vitals and nursing note reviewed.  Constitutional:      Appearance: Normal appearance.  HENT:     Head: Normocephalic and atraumatic.     Mouth/Throat:     Pharynx: Oropharynx is clear.  Eyes:     Pupils: Pupils are equal, round, and reactive to light.  Cardiovascular:     Rate and Rhythm: Normal rate and regular rhythm.  Pulmonary:     Effort: Pulmonary effort is normal.     Breath sounds: Normal breath sounds.  Abdominal:     General: Abdomen is flat.     Palpations: Abdomen is soft.  Musculoskeletal:        General: Normal range of motion.  Skin:    General: Skin is warm and dry.  Neurological:     General: No focal deficit present.     Mental Status: He is alert. Mental status is at baseline.  Psychiatric:        Attention and Perception: Attention normal.        Mood and Affect: Mood is depressed.        Speech: Speech normal.        Behavior: Behavior is cooperative.        Thought Content: Thought content normal.        Cognition and Memory: Cognition normal.    Review of Systems  Constitutional: Negative.   HENT: Negative.    Eyes: Negative.   Respiratory: Negative.    Cardiovascular: Negative.   Gastrointestinal:  Positive for heartburn.  Musculoskeletal: Negative.   Skin: Negative.   Neurological: Negative.   Psychiatric/Behavioral:  Positive for depression. Negative for suicidal ideas. The patient is nervous/anxious.    Blood pressure 120/86, pulse 94, temperature 98.3 F (36.8 C),  temperature source Oral, resp. rate 18, height '5\' 11"'$  (1.803 m), weight 74.8 kg, SpO2 98 %. Body mass index is 23.01 kg/m.   Treatment Plan Summary: Medication management and Plan no change to antidepressant medicine.  Added Pepcid standing for gastroesophageal reflux symptoms as well as as needed Vistaril and trazodone.  Treatment team met with patient and he will review options for substance abuse treatment  Alethia Berthold, MD 07/22/2022, 10:27 AM

## 2022-07-22 NOTE — BH IP Treatment Plan (Signed)
Interdisciplinary Treatment and Diagnostic Plan Update  07/22/2022 Time of Session: 8:51 AM HAO PARRA MRN: [id number]  Principal Diagnosis: MDD (major depressive disorder)  Secondary Diagnoses: Principal Problem:   MDD (major depressive disorder) Active Problems:   Alcohol use disorder, severe, dependence ([place])   Tobacco use disorder   HTN (hypertension), benign   Current Medications:  Current Facility-Administered Medications  Medication Dose Route Frequency Provider Last Rate Last Admin   acetaminophen (TYLENOL) tablet 650 mg  650 mg Oral Q6H PRN Bennett, Christal H, NP       alum & mag hydroxide-simeth (MAALOX/MYLANTA) 200-200-20 MG/5ML suspension 30 mL  30 mL Oral Q4H PRN Bennett, Christal H, NP       amLODipine (NORVASC) tablet 5 mg  5 mg Oral Daily Clapacs, John T, MD   5 mg at 07/22/22 0825   escitalopram (LEXAPRO) tablet 10 mg  10 mg Oral Daily Clapacs, Madie Reno, MD   10 mg at 07/22/22 0825   famotidine (PEPCID) tablet 20 mg  20 mg Oral BID Clapacs, Madie Reno, MD       hydrOXYzine (ATARAX) tablet 50 mg  50 mg Oral Q6H PRN Clapacs, John T, MD       magnesium hydroxide (MILK OF MAGNESIA) suspension 30 mL  30 mL Oral Daily PRN Bennett, Christal H, NP       nicotine (NICODERM CQ - dosed in mg/24 hours) patch 21 mg  21 mg Transdermal Daily Clapacs, John T, MD       nicotine polacrilex (NICORETTE) gum 2 mg  2 mg Oral PRN Clapacs, Madie Reno, MD       traZODone (DESYREL) tablet 100 mg  100 mg Oral QHS PRN Clapacs, Madie Reno, MD       PTA Medications: Medications Prior to Admission  Medication Sig Dispense Refill Last Dose   amLODipine (NORVASC) 5 MG tablet Take 1 tablet (5 mg total) by mouth daily. 30 tablet 11 Past Month   levETIRAcetam (KEPPRA) 500 MG tablet Take 1 tablet (500 mg total) by mouth 2 (two) times daily. (Patient not taking: Reported on 07/20/2022) 60 tablet 5 Not Taking    Patient Stressors: Substance abuse   Other: Per patient, "life".    Patient Strengths: Capable of  independent living  Marketing executive fund of knowledge  Motivation for treatment/growth  Work skills   Treatment Modalities: Medication Management, Group therapy, Case management,  1 to 1 session with clinician, Psychoeducation, Recreational therapy.   Physician Treatment Plan for Primary Diagnosis: MDD (major depressive disorder) Long Term Goal(s): Improvement in symptoms so as ready for discharge   Short Term Goals: Ability to maintain clinical measurements within normal limits will improve Compliance with prescribed medications will improve Ability to identify triggers associated with substance abuse/mental health issues will improve Ability to disclose and discuss suicidal ideas Ability to demonstrate self-control will improve  Medication Management: Evaluate patient's response, side effects, and tolerance of medication regimen.  Therapeutic Interventions: 1 to 1 sessions, Unit Group sessions and Medication administration.  Evaluation of Outcomes: Progressing  Physician Treatment Plan for Secondary Diagnosis: Principal Problem:   MDD (major depressive disorder) Active Problems:   Alcohol use disorder, severe, dependence ([place])   Tobacco use disorder   HTN (hypertension), benign  Long Term Goal(s): Improvement in symptoms so as ready for discharge   Short Term Goals: Ability to maintain clinical measurements within normal limits will improve Compliance with prescribed medications will improve Ability to identify triggers associated with substance abuse/mental  health issues will improve Ability to disclose and discuss suicidal ideas Ability to demonstrate self-control will improve     Medication Management: Evaluate patient's response, side effects, and tolerance of medication regimen.  Therapeutic Interventions: 1 to 1 sessions, Unit Group sessions and Medication administration.  Evaluation of Outcomes: Progressing   RN Treatment Plan for Primary  Diagnosis: MDD (major depressive disorder) Long Term Goal(s): Knowledge of disease and therapeutic regimen to maintain health will improve  Short Term Goals: Ability to demonstrate self-control, Ability to participate in decision making will improve, Ability to verbalize feelings will improve, Ability to disclose and discuss suicidal ideas, Ability to identify and develop effective coping behaviors will improve, and Compliance with prescribed medications will improve  Medication Management: RN will administer medications as ordered by provider, will assess and evaluate patient's response and provide education to patient for prescribed medication. RN will report any adverse and/or side effects to prescribing provider.  Therapeutic Interventions: 1 on 1 counseling sessions, Psychoeducation, Medication administration, Evaluate responses to treatment, Monitor vital signs and CBGs as ordered, Perform/monitor CIWA, COWS, AIMS and Fall Risk screenings as ordered, Perform wound care treatments as ordered.  Evaluation of Outcomes: Progressing   LCSW Treatment Plan for Primary Diagnosis: MDD (major depressive disorder) Long Term Goal(s): Safe transition to appropriate next level of care at discharge, Engage patient in therapeutic group addressing interpersonal concerns.  Short Term Goals: Engage patient in aftercare planning with referrals and resources, Increase social support, Increase ability to appropriately verbalize feelings, Increase emotional regulation, Facilitate acceptance of mental health diagnosis and concerns, Facilitate patient progression through stages of change regarding substance use diagnoses and concerns, Identify triggers associated with mental health/substance abuse issues, and Increase skills for wellness and recovery  Therapeutic Interventions: Assess for all discharge needs, 1 to 1 time with Social worker, Explore available resources and support systems, Assess for adequacy in  community support network, Educate family and significant other(s) on suicide prevention, Complete Psychosocial Assessment, Interpersonal group therapy.  Evaluation of Outcomes: Progressing   Progress in Treatment: Attending groups: Yes. Participating in groups: Yes. Taking medication as prescribed: Yes. Toleration medication: Yes. Family/Significant other contact made: No, will contact:  once permission is given Patient understands diagnosis: Yes. Discussing patient identified problems/goals with staff: Yes. Medical problems stabilized or resolved: Yes. Denies suicidal/homicidal ideation: Yes. Issues/concerns per patient self-inventory: No. Other: none  New problem(s) identified: No, Describe:  none  New Short Term/Long Term Goal(s): detox, elimination of symptoms of psychosis, medication management for mood stabilization; elimination of SI thoughts; development of comprehensive mental wellness/sobriety plan.   Patient Goals:  "quit drinking that's what's got me depressed"  Discharge Plan or Barriers: CSW to assist in the development of appropriate discharge plans.  At present patient is in contemplation stage as it relates to treatment.  Patient is unsure if he would like to go to an out patient provider or be referred to inpatient/residential substance abuse treatment.   Reason for Continuation of Hospitalization: Anxiety Depression Medication stabilization Suicidal ideation  Estimated Length of Stay:  1-5 days  Last 3 [place] Suicide Severity Risk Score: [place] Admission (Current) from 07/20/2022 in [place] Most recent reading at 07/20/2022  5:53 PM ED from 07/20/2022 in [facility] Emergency Department at [facility] Most recent reading at 07/20/2022  9:40 AM ED from 07/06/2022 in [facility] Emergency Department at [facility] Most recent reading at 07/06/2022  8:29 AM  C-SSRS RISK CATEGORY High Risk High Risk No Risk  Last  PHQ 2/9 Scores:     No data to display          Scribe for Treatment Team: Rozann Lesches, Marlinda Mike 07/22/2022 11:29 AM

## 2022-07-22 NOTE — Group Note (Signed)
LCSW Group Therapy Note    Group Date: 07/22/2022 Start Time: 1400 End Time: 1500   Type of Therapy and Topic: Group Therapy: Body Image  Participation Level:  Active  Description of Group:  Patients were educated about body image and asked to think about whether they have a healthy or unhealthy body image. Patients were led in a discussion about factors that contribute to body image, both internal and external. Patients were asked to discuss strengths of the human body outside of appearance, such as being able to fight off diseases and provide stress relief. Lastly, patients were asked to identify one way in which they appreciate their own body outside of appearance.   Therapeutic Goals:   1. Patient will differentiate between a healthy and unhealthy body image. 2. Patient will identify what contributes to body image 3. Patient will discuss the strengths of the human body. 4. Patient will identify a positive attribute of their body outside of physical appearance.  Summary of Patient Progress:  Patient actively engaged in processing and exploring how they are affected by body image. Patient proved open to input from peers and feedback from [place]. Patient demonstrated fair insight into the subject matter, was respectful and supportive of peers, and participated throughout the entire session.  Patient asked appropriate questions. Patient asked about alcoholism and how this affects him.  Therapeutic Modalities: Cognitive Behavioral Therapy; Solution-Focused Therapy  Maryjane Hurter 07/22/2022  3:18 PM

## 2022-07-23 DIAGNOSIS — F332 Major depressive disorder, recurrent severe without psychotic features: Secondary | ICD-10-CM | POA: Diagnosis not present

## 2022-07-23 NOTE — Progress Notes (Signed)
[facility] MD Progress Note  07/23/2022 10:47 AM Matthew [name]  MRN:  [id number] Subjective: Matthew [name] is seen on rounds.  He has no complaints.  He has been compliant with medications.  He denies any side effects.  Nurses report no issues. Principal Problem: MDD (major depressive disorder) Diagnosis: Principal Problem:   MDD (major depressive disorder) Active Problems:   Alcohol use disorder, severe, dependence ([place])   Tobacco use disorder   HTN (hypertension), benign  Total Time spent with patient: 15 minutes  Past Psychiatric History: Depression  Past Medical History:  Past Medical History:  Diagnosis Date   Hypertension    Pneumothorax 12/2014    Past Surgical History:  Procedure Laterality Date   APPENDECTOMY     Family History:  Family History  Problem Relation Age of Onset   Diabetes Maternal Grandmother    Family Psychiatric  History: Unremarkable Social History:  Social History   Substance and Sexual Activity  Alcohol Use Yes   Comment: 80 oz beer daily, 1/2 pint liquor daily     Social History   Substance and Sexual Activity  Drug Use No    Social History   Socioeconomic History   Marital status: Divorced    Spouse name: Not on file   Number of children: Not on file   Years of education: Not on file   Highest education level: Not on file  Occupational History   Not on file  Tobacco Use   Smoking status: Every Day    Packs/day: 0.50    Years: 35.00    Total pack years: 17.50    Types: Cigarettes   Smokeless tobacco: Never  Substance and Sexual Activity   Alcohol use: Yes    Comment: 80 oz beer daily, 1/2 pint liquor daily   Drug use: No   Sexual activity: Not Currently    Birth control/protection: None  Other Topics Concern   Not on file  Social History Narrative   Not on file   Social Determinants of Health   Financial Resource Strain: Not on file  Food Insecurity: No Food Insecurity (07/20/2022)   Hunger Vital Sign    Worried About Running Out of  Food in the Last Year: Never true    Ran Out of Food in the Last Year: Never true  Transportation Needs: No Transportation Needs (07/20/2022)   PRAPARE - Hydrologist (Medical): No    Lack of Transportation (Non-Medical): No  Physical Activity: Not on file  Stress: Not on file  Social Connections: Not on file   Additional Social History:                         Sleep: Good  Appetite:  Good  Current Medications: Current Facility-Administered Medications  Medication Dose Route Frequency Provider Last Rate Last Admin   acetaminophen (TYLENOL) tablet 650 mg  650 mg Oral Q6H PRN Bennett, Christal H, NP       alum & mag hydroxide-simeth (MAALOX/MYLANTA) 200-200-20 MG/5ML suspension 30 mL  30 mL Oral Q4H PRN Bennett, Christal H, NP       amLODipine (NORVASC) tablet 5 mg  5 mg Oral Daily Clapacs, John T, MD   5 mg at 07/23/22 0807   escitalopram (LEXAPRO) tablet 10 mg  10 mg Oral Daily Clapacs, Madie Reno, MD   10 mg at 07/23/22 0807   famotidine (PEPCID) tablet 20 mg  20 mg Oral BID Clapacs, Madie Reno,  MD   20 mg at 07/23/22 [id number]   hydrOXYzine (ATARAX) tablet 50 mg  50 mg Oral Q6H PRN Clapacs, Madie Reno, MD   50 mg at 07/22/22 1641   magnesium hydroxide (MILK OF MAGNESIA) suspension 30 mL  30 mL Oral Daily PRN Bennett, Christal H, NP       nicotine (NICODERM CQ - dosed in mg/24 hours) patch 21 mg  21 mg Transdermal Daily Clapacs, John T, MD       nicotine polacrilex (NICORETTE) gum 2 mg  2 mg Oral PRN Clapacs, Madie Reno, MD       traZODone (DESYREL) tablet 100 mg  100 mg Oral QHS PRN Clapacs, Madie Reno, MD   100 mg at 07/22/22 2127    Lab Results: No results found for this or any previous visit (from the past 48 hour(s)).  Blood Alcohol level:  Lab Results  Component Value Date   ETH <10 07/20/2022   ETH <10 0000000    Metabolic Disorder Labs: Lab Results  Component Value Date   HGBA1C 4.4 12/22/2014   No results found for: "PROLACTIN" No results found  for: "CHOL", "TRIG", "HDL", "CHOLHDL", "VLDL", "LDLCALC"  Physical Findings: AIMS: Facial and Oral Movements Muscles of Facial Expression: None, normal Lips and Perioral Area: None, normal Jaw: None, normal Tongue: None, normal,Extremity Movements Upper (arms, wrists, hands, fingers): None, normal Lower (legs, knees, ankles, toes): None, normal, Trunk Movements Neck, shoulders, hips: None, normal, Overall Severity Severity of abnormal movements (highest score from questions above): None, normal Incapacitation due to abnormal movements: None, normal Patient's awareness of abnormal movements (rate only patient's report): No Awareness, Dental Status Current problems with teeth and/or dentures?: No Does patient usually wear dentures?: No  CIWA:    COWS:     Musculoskeletal: Strength & Muscle Tone: within normal limits Gait & Station: normal Patient leans: N/A  Psychiatric Specialty Exam:  Presentation  General Appearance: No data recorded Eye Contact:No data recorded Speech:No data recorded Speech Volume:No data recorded Handedness:No data recorded  Mood and Affect  Mood:No data recorded Affect:No data recorded  Thought Process  Thought Processes:No data recorded Descriptions of Associations:No data recorded Orientation:No data recorded Thought Content:No data recorded History of Schizophrenia/Schizoaffective disorder:No data recorded Duration of Psychotic Symptoms:No data recorded Hallucinations:No data recorded Ideas of Reference:No data recorded Suicidal Thoughts:No data recorded Homicidal Thoughts:No data recorded  Sensorium  Memory:No data recorded Judgment:No data recorded Insight:No data recorded  Executive Functions  Concentration:No data recorded Attention Span:No data recorded Recall:No data recorded Fund of Knowledge:No data recorded Language:No data recorded  Psychomotor Activity  Psychomotor Activity:No data recorded  Assets  Assets:No data  recorded  Sleep  Sleep:No data recorded    Blood pressure 126/70, pulse 68, temperature 98.7 F (37.1 C), resp. rate 17, height '5\' 11"'$  (1.803 m), weight 74.8 kg, SpO2 97 %. Body mass index is 23.01 kg/m.   Treatment Plan Summary: Daily contact with patient to assess and evaluate symptoms and progress in treatment, Medication management, and Plan continue current medications.  Par[place] Ranger, DO 07/23/2022, 10:47 AM

## 2022-07-23 NOTE — Progress Notes (Signed)
Patient refused Nicotine patch, stating that he doesn't use them.

## 2022-07-23 NOTE — Plan of Care (Signed)
  Problem: Education: Goal: Knowledge of General Education information will improve Description: Including pain rating scale, medication(s)/side effects and non-pharmacologic comfort measures Outcome: Progressing   Problem: Health Behavior/Discharge Planning: Goal: Ability to manage health-related needs will improve Outcome: Progressing   Problem: Clinical Measurements: Goal: Ability to maintain clinical measurements within normal limits will improve Outcome: Progressing Goal: Will remain free from infection Outcome: Progressing Goal: Diagnostic test results will improve Outcome: Progressing Goal: Respiratory complications will improve Outcome: Progressing Goal: Cardiovascular complication will be avoided Outcome: Progressing   Problem: Activity: Goal: Risk for activity intolerance will decrease Outcome: Progressing   Problem: Nutrition: Goal: Adequate nutrition will be maintained Outcome: Progressing   Problem: Coping: Goal: Level of anxiety will decrease Outcome: Progressing   Problem: Elimination: Goal: Will not experience complications related to bowel motility Outcome: Progressing Goal: Will not experience complications related to urinary retention Outcome: Progressing   Problem: Pain Managment: Goal: General experience of comfort will improve Outcome: Progressing   Problem: Safety: Goal: Ability to remain free from injury will improve Outcome: Progressing   Problem: Skin Integrity: Goal: Risk for impaired skin integrity will decrease Outcome: Progressing   Problem: Education: Goal: Knowledge of [place] General Education information/materials will improve Outcome: Progressing Goal: Emotional status will improve Outcome: Progressing Goal: Mental status will improve Outcome: Progressing Goal: Verbalization of understanding the information provided will improve Outcome: Progressing   Problem: Safety: Goal: Periods of time without injury will  increase Outcome: Progressing   Problem: Health Behavior/Discharge Planning: Goal: Identification of resources available to assist in meeting health care needs will improve Outcome: Progressing   Problem: Medication: Goal: Compliance with prescribed medication regimen will improve Outcome: Progressing   Problem: Coping: Goal: Coping ability will improve Outcome: Progressing Goal: Will verbalize feelings Outcome: Progressing   Problem: Self-Concept: Goal: Level of anxiety will decrease Outcome: Progressing   

## 2022-07-23 NOTE — Progress Notes (Signed)
D- Patient alert and oriented x 4. Affect bright/mood euthymic. Denies SI/ HI/ AVH. Denies pain. States he's feeling better. "I haven't really thought bad thought about myself". He attended Social Work Meeting and outdoor activities. A- Scheduled medications administered to patient, per MD orders. Support and encouragement provided.  Routine safety checks conducted every 15 minutes.  Patient informed to notify staff with problems or concerns and verbalizes understanding. R- No adverse drug reactions noted. Patient compliant with medications and treatment plan. Patient receptive, calm, and cooperative and he interacts well with others on the unit.  Patient contracts for safety and remains safe on the unit at this time.

## 2022-07-23 NOTE — BHH Group Notes (Signed)
LCSW Group Therapy Note  07/23/2022 1:00pm  Type of Therapy/Topic:  Group Therapy:  Balance in Life  Participation Level:  Active  Description of Group:    This group will address the concept of balance and how it feels and looks when one is unbalanced. Patients will be encouraged to process areas in their lives that are out of balance and identify reasons for remaining unbalanced. Facilitators will guide patients in utilizing problem-solving interventions to address and correct the stressor making their life unbalanced. Understanding and applying boundaries will be explored and addressed for obtaining and maintaining a balanced life. Patients will be encouraged to explore ways to assertively make their unbalanced needs known to significant others in their lives, using other group members and facilitator for support and feedback.  Therapeutic Goals: Patient will identify two or more emotions or situations they have that consume much of in their lives. Patient will identify signs/triggers that life has become out of balance:  Patient will identify two ways to set boundaries in order to achieve balance in their lives:  Patient will demonstrate ability to communicate their needs through discussion and/or role plays  Summary of Patient Progress: Pt active in group discussion. Made a number of good comments regarding areas of his life that can get out of balance such as money and alcohol use.  Pt also shared about budgeting and recovery and how he has learned to avoid these areas getting out of balance. Attentive and appropriate throughout.       Therapeutic Modalities:   Cognitive Behavioral Therapy Solution-Focused Therapy Assertiveness Training  Deirdre Evener 07/23/2022 2:37 PM

## 2022-07-23 NOTE — Progress Notes (Signed)
D- Patient alert and oriented x3-4. Affect flat/mood sad. Denies SI/ HI/AVH. He denies  pain. He consents to depression, anxiety and feelings of hopelessness. He states he wants to "stop feeling useless and try to think positive". He attended Social work group today.  A- Scheduled medications administered to patient, per MD orders. Support and encouragement provided.  Routine safety checks conducted every 15 minutes without incidence.  Patient informed to notify staff with problems or concerns and verbalizes understanding. R- No adverse drug reactions noted. Patient contracts for safety at this time. Patient compliant with medications and treatment plan. Patient receptive, calm, and cooperative. Patient interacts well with others on the unit.  Patient remains safe at this time.

## 2022-07-24 DIAGNOSIS — F332 Major depressive disorder, recurrent severe without psychotic features: Secondary | ICD-10-CM | POA: Diagnosis not present

## 2022-07-24 NOTE — Plan of Care (Signed)
  Problem: Education: Goal: Knowledge of General Education information will improve Description: Including pain rating scale, medication(s)/side effects and non-pharmacologic comfort measures Outcome: Progressing   Problem: Health Behavior/Discharge Planning: Goal: Ability to manage health-related needs will improve Outcome: Progressing   Problem: Clinical Measurements: Goal: Ability to maintain clinical measurements within normal limits will improve Outcome: Progressing Goal: Will remain free from infection Outcome: Progressing Goal: Diagnostic test results will improve Outcome: Progressing Goal: Respiratory complications will improve Outcome: Progressing Goal: Cardiovascular complication will be avoided Outcome: Progressing   Problem: Activity: Goal: Risk for activity intolerance will decrease Outcome: Progressing   Problem: Nutrition: Goal: Adequate nutrition will be maintained Outcome: Progressing   Problem: Coping: Goal: Level of anxiety will decrease Outcome: Progressing   Problem: Elimination: Goal: Will not experience complications related to bowel motility Outcome: Progressing Goal: Will not experience complications related to urinary retention Outcome: Progressing   Problem: Pain Managment: Goal: General experience of comfort will improve Outcome: Progressing   Problem: Safety: Goal: Ability to remain free from injury will improve Outcome: Progressing   Problem: Skin Integrity: Goal: Risk for impaired skin integrity will decrease Outcome: Progressing   Problem: Education: Goal: Knowledge of  General Education information/materials will improve Outcome: Progressing Goal: Emotional status will improve Outcome: Progressing Goal: Mental status will improve Outcome: Progressing Goal: Verbalization of understanding the information provided will improve Outcome: Progressing   Problem: Safety: Goal: Periods of time without injury will  increase Outcome: Progressing   Problem: Health Behavior/Discharge Planning: Goal: Identification of resources available to assist in meeting health care needs will improve Outcome: Progressing   Problem: Medication: Goal: Compliance with prescribed medication regimen will improve Outcome: Progressing   Problem: Coping: Goal: Coping ability will improve Outcome: Progressing Goal: Will verbalize feelings Outcome: Progressing   Problem: Self-Concept: Goal: Level of anxiety will decrease Outcome: Progressing

## 2022-07-24 NOTE — BHH Group Notes (Signed)
[facility] Group Notes:  (Nursing/MHT/Case Management/Adjunct)  Date:  07/24/2022  Time:  7:12 PM  Type of Therapy:  Psychoeducational Skills  Participation Level:  Active  Participation Quality:  Appropriate  Affect:  Appropriate  Cognitive:  Appropriate  Insight:  Appropriate  Engagement in Group:  Engaged  Modes of Intervention:  Activity  Summary of Progress/Problems:  Adela Lank [facility] 07/24/2022, 7:12 PM

## 2022-07-24 NOTE — Progress Notes (Signed)
Patient alert and oriented x 4, affect is flat but brightens upon approach, no distress noted, he was noted interacting appropriately with peers and staff, and complaint with medication regimen. 15 minutes safety checks maintained will continue to monitor.

## 2022-07-24 NOTE — Progress Notes (Signed)
[facility] MD Progress Note  07/24/2022 11:02 AM Matthew [name]  MRN:  [id number] Subjective: Matthew [name] is seen on rounds.  He has no complaints.  Nurses report no issues.  Pleasant and cooperative. Principal Problem: MDD (major depressive disorder) Diagnosis: Principal Problem:   MDD (major depressive disorder) Active Problems:   Alcohol use disorder, severe, dependence ([place])   Tobacco use disorder   HTN (hypertension), benign  Total Time spent with patient: 15 minutes  Past Psychiatric History: Depression  Past Medical History:  Past Medical History:  Diagnosis Date   Hypertension    Pneumothorax 12/2014    Past Surgical History:  Procedure Laterality Date   APPENDECTOMY     Family History:  Family History  Problem Relation Age of Onset   Diabetes Maternal Grandmother    Family Psychiatric  History: Unremarkable Social History:  Social History   Substance and Sexual Activity  Alcohol Use Yes   Comment: 80 oz beer daily, 1/2 pint liquor daily     Social History   Substance and Sexual Activity  Drug Use No    Social History   Socioeconomic History   Marital status: Divorced    Spouse name: Not on file   Number of children: Not on file   Years of education: Not on file   Highest education level: Not on file  Occupational History   Not on file  Tobacco Use   Smoking status: Every Day    Packs/day: 0.50    Years: 35.00    Total pack years: 17.50    Types: Cigarettes   Smokeless tobacco: Never  Substance and Sexual Activity   Alcohol use: Yes    Comment: 80 oz beer daily, 1/2 pint liquor daily   Drug use: No   Sexual activity: Not Currently    Birth control/protection: None  Other Topics Concern   Not on file  Social History Narrative   Not on file   Social Determinants of Health   Financial Resource Strain: Not on file  Food Insecurity: No Food Insecurity (07/20/2022)   Hunger Vital Sign    Worried About Running Out of Food in the Last Year: Never true    Ran  Out of Food in the Last Year: Never true  Transportation Needs: No Transportation Needs (07/20/2022)   PRAPARE - Hydrologist (Medical): No    Lack of Transportation (Non-Medical): No  Physical Activity: Not on file  Stress: Not on file  Social Connections: Not on file   Additional Social History:                         Sleep: Good  Appetite:  Good  Current Medications: Current Facility-Administered Medications  Medication Dose Route Frequency Provider Last Rate Last Admin   acetaminophen (TYLENOL) tablet 650 mg  650 mg Oral Q6H PRN Bennett, Christal H, NP       alum & mag hydroxide-simeth (MAALOX/MYLANTA) 200-200-20 MG/5ML suspension 30 mL  30 mL Oral Q4H PRN Bennett, Christal H, NP       amLODipine (NORVASC) tablet 5 mg  5 mg Oral Daily Clapacs, John T, MD   5 mg at 07/24/22 [id number]   escitalopram (LEXAPRO) tablet 10 mg  10 mg Oral Daily Clapacs, Madie Reno, MD   10 mg at 07/24/22 0823   famotidine (PEPCID) tablet 20 mg  20 mg Oral BID Clapacs, Madie Reno, MD   20 mg at 07/24/22 [phone]  hydrOXYzine (ATARAX) tablet 50 mg  50 mg Oral Q6H PRN Clapacs, John T, MD   50 mg at 07/23/22 2123   magnesium hydroxide (MILK OF MAGNESIA) suspension 30 mL  30 mL Oral Daily PRN Bennett, Christal H, NP       nicotine (NICODERM CQ - dosed in mg/24 hours) patch 21 mg  21 mg Transdermal Daily Clapacs, John T, MD   21 mg at 07/24/22 [id number]   nicotine polacrilex (NICORETTE) gum 2 mg  2 mg Oral PRN Clapacs, Madie Reno, MD       traZODone (DESYREL) tablet 100 mg  100 mg Oral QHS PRN Clapacs, Madie Reno, MD   100 mg at 07/23/22 2123    Lab Results: No results found for this or any previous visit (from the past 48 hour(s)).  Blood Alcohol level:  Lab Results  Component Value Date   ETH <10 07/20/2022   ETH <10 0000000    Metabolic Disorder Labs: Lab Results  Component Value Date   HGBA1C 4.4 12/22/2014   No results found for: "PROLACTIN" No results found for: "CHOL", "TRIG",  "HDL", "CHOLHDL", "VLDL", "LDLCALC"  Physical Findings: AIMS: Facial and Oral Movements Muscles of Facial Expression: None, normal Lips and Perioral Area: None, normal Jaw: None, normal Tongue: None, normal,Extremity Movements Upper (arms, wrists, hands, fingers): None, normal Lower (legs, knees, ankles, toes): None, normal, Trunk Movements Neck, shoulders, hips: None, normal, Overall Severity Severity of abnormal movements (highest score from questions above): None, normal Incapacitation due to abnormal movements: None, normal Patient's awareness of abnormal movements (rate only patient's report): No Awareness, Dental Status Current problems with teeth and/or dentures?: No Does patient usually wear dentures?: No  CIWA:    COWS:     Musculoskeletal: Strength & Muscle Tone: within normal limits Gait & Station: normal Patient leans: N/A  Psychiatric Specialty Exam:  Presentation  General Appearance: No data recorded Eye Contact:No data recorded Speech:No data recorded Speech Volume:No data recorded Handedness:No data recorded  Mood and Affect  Mood:No data recorded Affect:No data recorded  Thought Process  Thought Processes:No data recorded Descriptions of Associations:No data recorded Orientation:No data recorded Thought Content:No data recorded History of Schizophrenia/Schizoaffective disorder:No data recorded Duration of Psychotic Symptoms:No data recorded Hallucinations:No data recorded Ideas of Reference:No data recorded Suicidal Thoughts:No data recorded Homicidal Thoughts:No data recorded  Sensorium  Memory:No data recorded Judgment:No data recorded Insight:No data recorded  Executive Functions  Concentration:No data recorded Attention Span:No data recorded Recall:No data recorded Fund of Knowledge:No data recorded Language:No data recorded  Psychomotor Activity  Psychomotor Activity:No data recorded  Assets  Assets:No data recorded  Sleep   Sleep:No data recorded    Blood pressure (!) 145/81, pulse 81, temperature 98.6 F (37 C), temperature source Oral, resp. rate 18, height '5\' 11"'$  (1.803 m), weight 74.8 kg, SpO2 98 %. Body mass index is 23.01 kg/m.   Treatment Plan Summary: Daily contact with patient to assess and evaluate symptoms and progress in treatment, Medication management, and Plan continue current medications.  [place], DO 07/24/2022, 11:02 AM

## 2022-07-24 NOTE — Progress Notes (Signed)
Patient calm and pleasant during assessment denying SI/HI/AVH. Pt observed interacting appropriately with staff and peers on the unit. Pt didn't have any medication scheduled tonight and hasn't requested anything PRN as of now. Pt given education, support, and encouragement to be active in his treatment plan. Pt being monitored Q 15 minutes for safety per unit protocol, remains safe on the unit

## 2022-07-24 NOTE — BHH Group Notes (Signed)
LCSW Wellness Group Note   07/24/2022 12:45pm  Type of Group and Topic: Psychoeducational Group:  Wellness  Participation Level:  Active  Description of Group  Wellness group introduces the topic and its focus on developing healthy habits across the spectrum and its relationship to a decrease in hospital admissions.  Six areas of wellness are discussed: physical, social spiritual, intellectual, occupational, and emotional.  Patients are asked to consider their current wellness habits and to identify areas of wellness where they are interested and able to focus on improvements.    Therapeutic Goals Patients will understand components of wellness and how they can positively impact overall health.  Patients will identify areas of wellness where they have developed good habits. Patients will identify areas of wellness where they would like to make improvements.    Summary of Patient Progress;Pt once again active and appropriate in group, made a number of contributions to the discussion.  Identified financial wellness as an area of strength and emotional wellness as an area that needs some work.     Therapeutic Modalities: Cognitive Behavioral Therapy Psychoeducation    Joanne Chars, LCSW

## 2022-07-25 DIAGNOSIS — F332 Major depressive disorder, recurrent severe without psychotic features: Secondary | ICD-10-CM | POA: Diagnosis not present

## 2022-07-25 NOTE — Group Note (Signed)
[facility] LCSW Group Therapy Note    Group Date: 07/25/2022 Start Time: 1300 End Time: 1400  Type of Therapy and Topic:  Group Therapy:  Overcoming Obstacles  Participation Level:  [facility] PARTICIPATION LEVEL: Minimal   Description of Group:   In this group patients will be encouraged to explore what they see as obstacles to their own wellness and recovery. They will be guided to discuss their thoughts, feelings, and behaviors related to these obstacles. The group will process together ways to cope with barriers, with attention given to specific choices patients can make. Each patient will be challenged to identify changes they are motivated to make in order to overcome their obstacles. This group will be process-oriented, with patients participating in exploration of their own experiences as well as giving and receiving support and challenge from other group members.  Therapeutic Goals: 1. Patient will identify personal and current obstacles as they relate to admission. 2. Patient will identify barriers that currently interfere with their wellness or overcoming obstacles.  3. Patient will identify feelings, thought process and behaviors related to these barriers. 4. Patient will identify two changes they are willing to make to overcome these obstacles:    Summary of Patient Progress Patient came into the group room during the last few minutes of class. He had just finished with a phone interview with a program. Pt shared a bit about his history of relapse and recovery. He stated that every time that he makes progress, he ends up falling back down. During his time in the room, he was appropriate. He appeared open and receptive to comments from both peers and facilitator.    Therapeutic Modalities:   Cognitive Behavioral Therapy Solution Focused Therapy Motivational Interviewing Relapse Prevention Therapy   Shirl Harris, LCSW

## 2022-07-25 NOTE — [facility] Group Notes (Signed)
[facility] Group Notes:  (Nursing/MHT/Case Management/Adjunct)  Date:  07/25/2022  Time:  9:13 PM  Type of Therapy:   Wrap up  Participation Level:  Active  Participation Quality:  Appropriate  Affect:  Appropriate  Cognitive:  Alert  Insight:  Good  Engagement in Group:  Engaged and goal is to make amend with some people.  Modes of Intervention:  Support  Summary of Progress/Problems:  Matthew [name] 07/25/2022, 9:13 PM

## 2022-07-25 NOTE — Progress Notes (Signed)
Patient calm and pleasant during assessment denying SI/HI/AVH. Pt observed interacting appropriately with staff and peers on the unit. Pt didn't have any medication scheduled tonight and hasn't requested anything PRN as of now. Pt given education, support, and encouragement to be active in his treatment plan. Pt being monitored Q 15 minutes for safety per unit protocol, remains safe on the unit

## 2022-07-25 NOTE — Progress Notes (Signed)
[facility] MD Progress Note  07/25/2022 3:58 PM Matthew [name]  MRN:  [id number] Subjective: Patient seen and chart reviewed.  62 year old man with depression and alcohol use problems.  Patient working hard on recovery.  Made some calls to [facility] today and is hoping for exceptions.  Attending groups.  No suicidal thoughts.  No psychosis.  Good self-care. Principal Problem: MDD (major depressive disorder) Diagnosis: Principal Problem:   MDD (major depressive disorder) Active Problems:   Alcohol use disorder, severe, dependence ([place])   Tobacco use disorder   HTN (hypertension), benign  Total Time spent with patient: 20 minutes  Past Psychiatric History: History of alcohol abuse and depression  Past Medical History:  Past Medical History:  Diagnosis Date   Hypertension    Pneumothorax 12/2014    Past Surgical History:  Procedure Laterality Date   APPENDECTOMY     Family History:  Family History  Problem Relation Age of Onset   Diabetes Maternal Grandmother    Family Psychiatric  History: See previous Social History:  Social History   Substance and Sexual Activity  Alcohol Use Yes   Comment: 80 oz beer daily, 1/2 pint liquor daily     Social History   Substance and Sexual Activity  Drug Use No    Social History   Socioeconomic History   Marital status: Divorced    Spouse name: Not on file   Number of children: Not on file   Years of education: Not on file   Highest education level: Not on file  Occupational History   Not on file  Tobacco Use   Smoking status: Every Day    Packs/day: 0.50    Years: 35.00    Total pack years: 17.50    Types: Cigarettes   Smokeless tobacco: Never  Substance and Sexual Activity   Alcohol use: Yes    Comment: 80 oz beer daily, 1/2 pint liquor daily   Drug use: No   Sexual activity: Not Currently    Birth control/protection: None  Other Topics Concern   Not on file  Social History Narrative   Not on file   Social Determinants of  Health   Financial Resource Strain: Not on file  Food Insecurity: No Food Insecurity (07/20/2022)   Hunger Vital Sign    Worried About Running Out of Food in the Last Year: Never true    Ran Out of Food in the Last Year: Never true  Transportation Needs: No Transportation Needs (07/20/2022)   PRAPARE - Hydrologist (Medical): No    Lack of Transportation (Non-Medical): No  Physical Activity: Not on file  Stress: Not on file  Social Connections: Not on file   Additional Social History:                         Sleep: Fair  Appetite:  Fair  Current Medications: Current Facility-Administered Medications  Medication Dose Route Frequency Provider Last Rate Last Admin   acetaminophen (TYLENOL) tablet 650 mg  650 mg Oral Q6H PRN Bennett, Christal H, NP       alum & mag hydroxide-simeth (MAALOX/MYLANTA) 200-200-20 MG/5ML suspension 30 mL  30 mL Oral Q4H PRN Bennett, Christal H, NP       amLODipine (NORVASC) tablet 5 mg  5 mg Oral Daily Pike Scantlebury T, MD   5 mg at 07/25/22 0820   escitalopram (LEXAPRO) tablet 10 mg  10 mg Oral Daily Traylen Eckels, Madie Reno,  MD   10 mg at 07/25/22 0820   famotidine (PEPCID) tablet 20 mg  20 mg Oral BID Eknoor Novack T, MD   20 mg at 07/25/22 0820   hydrOXYzine (ATARAX) tablet 50 mg  50 mg Oral Q6H PRN Dartanyon Frankowski T, MD   50 mg at 07/24/22 2218   magnesium hydroxide (MILK OF MAGNESIA) suspension 30 mL  30 mL Oral Daily PRN Bennett, Christal H, NP       nicotine (NICODERM CQ - dosed in mg/24 hours) patch 21 mg  21 mg Transdermal Daily Lakin Romer, Madie Reno, MD   21 mg at 07/25/22 [id number]   nicotine polacrilex (NICORETTE) gum 2 mg  2 mg Oral PRN Elleni Mozingo, Madie Reno, MD       traZODone (DESYREL) tablet 100 mg  100 mg Oral QHS PRN Kersti Scavone, Madie Reno, MD   100 mg at 07/24/22 2218    Lab Results: No results found for this or any previous visit (from the past 62 hour(s)).  Blood Alcohol level:  Lab Results  Component Value Date   ETH <10  07/20/2022   ETH <10 0000000    Metabolic Disorder Labs: Lab Results  Component Value Date   HGBA1C 4.4 12/22/2014   No results found for: "PROLACTIN" No results found for: "CHOL", "TRIG", "HDL", "CHOLHDL", "VLDL", "LDLCALC"  Physical Findings: AIMS: Facial and Oral Movements Muscles of Facial Expression: None, normal Lips and Perioral Area: None, normal Jaw: None, normal Tongue: None, normal,Extremity Movements Upper (arms, wrists, hands, fingers): None, normal Lower (legs, knees, ankles, toes): None, normal, Trunk Movements Neck, shoulders, hips: None, normal, Overall Severity Severity of abnormal movements (highest score from questions above): None, normal Incapacitation due to abnormal movements: None, normal Patient's awareness of abnormal movements (rate only patient's report): No Awareness, Dental Status Current problems with teeth and/or dentures?: No Does patient usually wear dentures?: No  CIWA:    COWS:     Musculoskeletal: Strength & Muscle Tone: within normal limits Gait & Station: normal Patient leans: N/A  Psychiatric Specialty Exam:  Presentation  General Appearance: No data recorded Eye Contact:No data recorded Speech:No data recorded Speech Volume:No data recorded Handedness:No data recorded  Mood and Affect  Mood:No data recorded Affect:No data recorded  Thought Process  Thought Processes:No data recorded Descriptions of Associations:No data recorded Orientation:No data recorded Thought Content:No data recorded History of Schizophrenia/Schizoaffective disorder:No data recorded Duration of Psychotic Symptoms:No data recorded Hallucinations:No data recorded Ideas of Reference:No data recorded Suicidal Thoughts:No data recorded Homicidal Thoughts:No data recorded  Sensorium  Memory:No data recorded Judgment:No data recorded Insight:No data recorded  Executive Functions  Concentration:No data recorded Attention Span:No data  recorded Recall:No data recorded Fund of Knowledge:No data recorded Language:No data recorded  Psychomotor Activity  Psychomotor Activity:No data recorded  Assets  Assets:No data recorded  Sleep  Sleep:No data recorded   Physical Exam: Physical Exam Vitals reviewed.  Constitutional:      Appearance: Normal appearance.  HENT:     Head: Normocephalic and atraumatic.     Mouth/Throat:     Pharynx: Oropharynx is clear.  Eyes:     Pupils: Pupils are equal, round, and reactive to light.  Cardiovascular:     Rate and Rhythm: Normal rate and regular rhythm.  Pulmonary:     Effort: Pulmonary effort is normal.     Breath sounds: Normal breath sounds.  Abdominal:     General: Abdomen is flat.     Palpations: Abdomen is soft.  Musculoskeletal:  General: Normal range of motion.  Skin:    General: Skin is warm and dry.  Neurological:     General: No focal deficit present.     Mental Status: He is alert. Mental status is at baseline.  Psychiatric:        Mood and Affect: Mood normal.        Thought Content: Thought content normal.    Review of Systems  Constitutional: Negative.   HENT: Negative.    Eyes: Negative.   Respiratory: Negative.    Cardiovascular: Negative.   Gastrointestinal: Negative.   Musculoskeletal: Negative.   Skin: Negative.   Neurological: Negative.   Psychiatric/Behavioral: Negative.     Blood pressure 127/66, pulse 67, temperature 98.8 F (37.1 C), resp. rate 18, height '5\' 11"'$  (1.803 m), weight 74.8 kg, SpO2 97 %. Body mass index is 23.01 kg/m.   Treatment Plan Summary: Plan no change to medication.  We hope that he will be accepted at [facility].  Continues to make progress with his mood and his stability and his commitment to sobriety.  Alethia Berthold, MD 07/25/2022, 3:58 PM

## 2022-07-25 NOTE — Group Note (Signed)
Recreation Therapy Group Note   Group Topic:Goal Setting  Group Date: 07/25/2022 Start Time: 1000 End Time: 1055 Facilitators: Vilma Prader, LRT, CTRS Location:  Craft Room  Group Description: Scientist, physiological. Patients were given many different magazines, a glue stick, markers, and a piece of cardstock paper. LRT and pts discussed the importance of having goals in life. LRT and pts discussed the difference between short-term and long-term goals. LRT encouraged pts to create a vision board, with images they picked and then cut out by LRT from the magazine, for themselves that capture their short and long-term goals they have for themselves. On the back of the paper, pt encouraged to write 3 different coping skills that can help them reach those goals. LRT encouraged pts to show and explain their vision board to the group once complete. LRT offered to laminate vision board once dry and complete.   Affect/Mood: Appropriate   Participation Level: Active and Engaged   Participation Quality: Independent   Behavior: Alert, Appropriate, and Calm   Speech/Thought Process: Coherent   Insight: Good and Improved   Judgement: Good   Modes of Intervention: Activity and Education   Patient Response to Interventions:  Attentive, Engaged, Interested , and Receptive   Education Outcome:  Acknowledges education   Clinical Observations/Individualized Feedback: Matthew [name] was active in their participation of session activities and group discussion. Pt identified "Keep control of your life. Make sure your goals are achievable" as well as " life, careers and kids". Pt spontaneously contributed to group discussion more than once and provided good insight each time. Pt engaged appropriately with LRT and peers throughout session.    Plan: Continue to engage patient in RT group sessions 2-3x/week.   Vilma Prader, LRT, CTRS 07/25/2022 11:09 AM

## 2022-07-25 NOTE — [facility] Group Notes (Signed)
[facility] Group Notes:  (Nursing/MHT/Case Management/Adjunct)  Date:  07/25/2022  Time:  3:19 PM  Type of Therapy:  Psychoeducational Skills  Participation Level:  Active  Participation Quality:  Appropriate  Affect:  Appropriate  Cognitive:  Appropriate  Insight:  Appropriate  Engagement in Group:  Engaged  Modes of Intervention:  Activity  Summary of Progress/Problems:  Adela Lank [facility] 07/25/2022, 3:19 PM

## 2022-07-25 NOTE — Plan of Care (Signed)
D- Patient alert and oriented. Patient presented in a pleasant mood on assessment reporting that "it took me a while, but it's better than I have been", regarding his sleep last night. Patient had no complaints to voice to this Probation officer. Patient endorsed depression, anxiety, and hopelessness on his self-inventory, but denied all to this Probation officer. Patient stated that he has to make a phone call at 1 pm, for potential placement. Patient denied SI, HI, AVH, and pain at this time, stating "not today". Patient's goal for today is "self-esteem", in which "prayer" will help him achieve his goal.  A- Scheduled medications administered to patient, per MD orders. Support and encouragement provided.  Routine safety checks conducted every 15 minutes.  Patient informed to notify staff with problems or concerns.  R- No adverse drug reactions noted. Patient contracts for safety at this time. Patient compliant with medications and treatment plan. Patient receptive, calm, and cooperative. Patient interacts well with others on the unit. Patient remains safe at this time.  Problem: Education: Goal: Knowledge of General Education information will improve Description: Including pain rating scale, medication(s)/side effects and non-pharmacologic comfort measures Outcome: Progressing   Problem: Health Behavior/Discharge Planning: Goal: Ability to manage health-related needs will improve Outcome: Progressing   Problem: Clinical Measurements: Goal: Ability to maintain clinical measurements within normal limits will improve Outcome: Progressing Goal: Will remain free from infection Outcome: Progressing Goal: Diagnostic test results will improve Outcome: Progressing Goal: Respiratory complications will improve Outcome: Progressing Goal: Cardiovascular complication will be avoided Outcome: Progressing   Problem: Activity: Goal: Risk for activity intolerance will decrease Outcome: Progressing   Problem:  Nutrition: Goal: Adequate nutrition will be maintained Outcome: Progressing   Problem: Coping: Goal: Level of anxiety will decrease Outcome: Progressing   Problem: Elimination: Goal: Will not experience complications related to bowel motility Outcome: Progressing Goal: Will not experience complications related to urinary retention Outcome: Progressing   Problem: Pain Managment: Goal: General experience of comfort will improve Outcome: Progressing   Problem: Safety: Goal: Ability to remain free from injury will improve Outcome: Progressing   Problem: Skin Integrity: Goal: Risk for impaired skin integrity will decrease Outcome: Progressing   Problem: Education: Goal: Knowledge of [place] [facility] Education information/materials will improve Outcome: Progressing Goal: Emotional status will improve Outcome: Progressing Goal: Mental status will improve Outcome: Progressing Goal: Verbalization of understanding the information provided will improve Outcome: Progressing   Problem: Safety: Goal: Periods of time without injury will increase Outcome: Progressing   Problem: Health Behavior/Discharge Planning: Goal: Identification of resources available to assist in meeting health care needs will improve Outcome: Progressing   Problem: Medication: Goal: Compliance with prescribed medication regimen will improve Outcome: Progressing   Problem: Coping: Goal: Coping ability will improve Outcome: Progressing Goal: Will verbalize feelings Outcome: Progressing   Problem: Self-Concept: Goal: Level of anxiety will decrease Outcome: Progressing

## 2022-07-26 DIAGNOSIS — F332 Major depressive disorder, recurrent severe without psychotic features: Secondary | ICD-10-CM | POA: Diagnosis not present

## 2022-07-26 NOTE — Progress Notes (Signed)
[facility] MD Progress Note  07/26/2022 4:03 PM NUEL COOKSEY  MRN:  [id number] Subjective: Follow-up 62 year old man with depression and substance abuse.  He found out that he got into [facility] and will be going on Thursday.  Mood is good affect is more upbeat and more relaxed today.  No suicidal thoughts reported. Principal Problem: MDD (major depressive disorder) Diagnosis: Principal Problem:   MDD (major depressive disorder) Active Problems:   Alcohol use disorder, severe, dependence ([place])   Tobacco use disorder   HTN (hypertension), benign  Total Time spent with patient: 30 minutes  Past Psychiatric History: Past history of recurrent substance use problems and depression  Past Medical History:  Past Medical History:  Diagnosis Date   Hypertension    Pneumothorax 12/2014    Past Surgical History:  Procedure Laterality Date   APPENDECTOMY     Family History:  Family History  Problem Relation Age of Onset   Diabetes Maternal Grandmother    Family Psychiatric  History: See previous Social History:  Social History   Substance and Sexual Activity  Alcohol Use Yes   Comment: 80 oz beer daily, 1/2 pint liquor daily     Social History   Substance and Sexual Activity  Drug Use No    Social History   Socioeconomic History   Marital status: Divorced    Spouse name: Not on file   Number of children: Not on file   Years of education: Not on file   Highest education level: Not on file  Occupational History   Not on file  Tobacco Use   Smoking status: Every Day    Packs/day: 0.50    Years: 35.00    Total pack years: 17.50    Types: Cigarettes   Smokeless tobacco: Never  Substance and Sexual Activity   Alcohol use: Yes    Comment: 80 oz beer daily, 1/2 pint liquor daily   Drug use: No   Sexual activity: Not Currently    Birth control/protection: None  Other Topics Concern   Not on file  Social History Narrative   Not on file   Social Determinants of Health    Financial Resource Strain: Not on file  Food Insecurity: No Food Insecurity (07/20/2022)   Hunger Vital Sign    Worried About Running Out of Food in the Last Year: Never true    Ran Out of Food in the Last Year: Never true  Transportation Needs: No Transportation Needs (07/20/2022)   PRAPARE - Hydrologist (Medical): No    Lack of Transportation (Non-Medical): No  Physical Activity: Not on file  Stress: Not on file  Social Connections: Not on file   Additional Social History:                         Sleep: Fair  Appetite:  Fair  Current Medications: Current Facility-Administered Medications  Medication Dose Route Frequency Provider Last Rate Last Admin   acetaminophen (TYLENOL) tablet 650 mg  650 mg Oral Q6H PRN Bennett, Christal H, NP       alum & mag hydroxide-simeth (MAALOX/MYLANTA) 200-200-20 MG/5ML suspension 30 mL  30 mL Oral Q4H PRN Bennett, Christal H, NP       amLODipine (NORVASC) tablet 5 mg  5 mg Oral Daily Ronney Honeywell T, MD   5 mg at 07/26/22 0826   escitalopram (LEXAPRO) tablet 10 mg  10 mg Oral Daily Zerina Hallinan, Madie Reno, MD  10 mg at 07/26/22 0827   famotidine (PEPCID) tablet 20 mg  20 mg Oral BID Cayci Mcnabb, Madie Reno, MD   20 mg at 07/26/22 0827   hydrOXYzine (ATARAX) tablet 50 mg  50 mg Oral Q6H PRN Adreanna Fickel T, MD   50 mg at 07/25/22 2232   magnesium hydroxide (MILK OF MAGNESIA) suspension 30 mL  30 mL Oral Daily PRN Bennett, Christal H, NP       nicotine (NICODERM CQ - dosed in mg/24 hours) patch 21 mg  21 mg Transdermal Daily Damiano Stamper T, MD   21 mg at 07/26/22 0827   nicotine polacrilex (NICORETTE) gum 2 mg  2 mg Oral PRN Kriti Katayama, Madie Reno, MD       traZODone (DESYREL) tablet 100 mg  100 mg Oral QHS PRN Clarabell Matsuoka, Madie Reno, MD   100 mg at 07/25/22 2232    Lab Results: No results found for this or any previous visit (from the past 3 hour(s)).  Blood Alcohol level:  Lab Results  Component Value Date   ETH <10 07/20/2022    ETH <10 0000000    Metabolic Disorder Labs: Lab Results  Component Value Date   HGBA1C 4.4 12/22/2014   No results found for: "PROLACTIN" No results found for: "CHOL", "TRIG", "HDL", "CHOLHDL", "VLDL", "LDLCALC"  Physical Findings: AIMS: Facial and Oral Movements Muscles of Facial Expression: None, normal Lips and Perioral Area: None, normal Jaw: None, normal Tongue: None, normal,Extremity Movements Upper (arms, wrists, hands, fingers): None, normal Lower (legs, knees, ankles, toes): None, normal, Trunk Movements Neck, shoulders, hips: None, normal, Overall Severity Severity of abnormal movements (highest score from questions above): None, normal Incapacitation due to abnormal movements: None, normal Patient's awareness of abnormal movements (rate only patient's report): No Awareness, Dental Status Current problems with teeth and/or dentures?: No Does patient usually wear dentures?: No  CIWA:    COWS:     Musculoskeletal: Strength & Muscle Tone: within normal limits Gait & Station: normal Patient leans: N/A  Psychiatric Specialty Exam:  Presentation  General Appearance: No data recorded Eye Contact:No data recorded Speech:No data recorded Speech Volume:No data recorded Handedness:No data recorded  Mood and Affect  Mood:No data recorded Affect:No data recorded  Thought Process  Thought Processes:No data recorded Descriptions of Associations:No data recorded Orientation:No data recorded Thought Content:No data recorded History of Schizophrenia/Schizoaffective disorder:No data recorded Duration of Psychotic Symptoms:No data recorded Hallucinations:No data recorded Ideas of Reference:No data recorded Suicidal Thoughts:No data recorded Homicidal Thoughts:No data recorded  Sensorium  Memory:No data recorded Judgment:No data recorded Insight:No data recorded  Executive Functions  Concentration:No data recorded Attention Span:No data recorded Recall:No  data recorded Fund of Knowledge:No data recorded Language:No data recorded  Psychomotor Activity  Psychomotor Activity:No data recorded  Assets  Assets:No data recorded  Sleep  Sleep:No data recorded   Physical Exam: Physical Exam Vitals and nursing note reviewed.  Constitutional:      Appearance: Normal appearance.  HENT:     Head: Normocephalic and atraumatic.     Mouth/Throat:     Pharynx: Oropharynx is clear.  Eyes:     Pupils: Pupils are equal, round, and reactive to light.  Cardiovascular:     Rate and Rhythm: Normal rate and regular rhythm.  Pulmonary:     Effort: Pulmonary effort is normal.     Breath sounds: Normal breath sounds.  Abdominal:     General: Abdomen is flat.     Palpations: Abdomen is soft.  Musculoskeletal:  General: Normal range of motion.  Skin:    General: Skin is warm and dry.  Neurological:     General: No focal deficit present.     Mental Status: He is alert. Mental status is at baseline.  Psychiatric:        Attention and Perception: Attention normal.        Mood and Affect: Mood normal.        Speech: Speech normal.        Behavior: Behavior normal.        Thought Content: Thought content normal.        Cognition and Memory: Cognition normal.    Review of Systems  Constitutional: Negative.   HENT: Negative.    Eyes: Negative.   Respiratory: Negative.    Cardiovascular: Negative.   Gastrointestinal: Negative.   Musculoskeletal: Negative.   Skin: Negative.   Neurological: Negative.   Psychiatric/Behavioral: Negative.     Blood pressure (!) 151/73, pulse 75, temperature 97.8 F (36.6 C), temperature source Oral, resp. rate 18, height '5\' 11"'$  (1.803 m), weight 74.8 kg, SpO2 100 %. Body mass index is 23.01 kg/m.   Treatment Plan Summary: Medication management and Plan no change to medication.  Congratulated him on what I think is a very good plan.  We will get to work on making sure things are prepared for discharge in  time for Thursday.  Alethia Berthold, MD 07/26/2022, 4:03 PM

## 2022-07-26 NOTE — Plan of Care (Signed)
D- Patient alert and oriented. Patient presented in a pleasant mood on assessment reporting that he slept "fair" last night and had no complaints to voice to this Probation officer. Patient endorsed passive SI, stating that the thoughts "comes and go", but he did contract for safety. Although patient endorsed hopelessness, depression, and anxiety on his self-inventory, he did not report any of this on assessment. Patient denied HI/AVH and pain at this time. Patient's goal for today is "getting rid of negative thoughts", in which he will "try to stay busy and pray", in order to achieve his goal.  A- Scheduled medications administered to patient, per MD orders. Support and encouragement provided.  Routine safety checks conducted every 15 minutes.  Patient informed to notify staff with problems or concerns.  R- No adverse drug reactions noted. Patient contracts for safety at this time. Patient compliant with medications and treatment plan. Patient receptive, calm, and cooperative. Patient interacts well with others on the unit.  Patient remains safe at this time.  Problem: Education: Goal: Knowledge of General Education information will improve Description: Including pain rating scale, medication(s)/side effects and non-pharmacologic comfort measures Outcome: Progressing   Problem: Health Behavior/Discharge Planning: Goal: Ability to manage health-related needs will improve Outcome: Progressing   Problem: Clinical Measurements: Goal: Ability to maintain clinical measurements within normal limits will improve Outcome: Progressing Goal: Will remain free from infection Outcome: Progressing Goal: Diagnostic test results will improve Outcome: Progressing Goal: Respiratory complications will improve Outcome: Progressing Goal: Cardiovascular complication will be avoided Outcome: Progressing   Problem: Activity: Goal: Risk for activity intolerance will decrease Outcome: Progressing   Problem:  Nutrition: Goal: Adequate nutrition will be maintained Outcome: Progressing   Problem: Coping: Goal: Level of anxiety will decrease Outcome: Progressing   Problem: Elimination: Goal: Will not experience complications related to bowel motility Outcome: Progressing Goal: Will not experience complications related to urinary retention Outcome: Progressing   Problem: Pain Managment: Goal: General experience of comfort will improve Outcome: Progressing   Problem: Safety: Goal: Ability to remain free from injury will improve Outcome: Progressing   Problem: Skin Integrity: Goal: Risk for impaired skin integrity will decrease Outcome: Progressing   Problem: Education: Goal: Knowledge of [place] General Education information/materials will improve Outcome: Progressing Goal: Emotional status will improve Outcome: Progressing Goal: Mental status will improve Outcome: Progressing Goal: Verbalization of understanding the information provided will improve Outcome: Progressing   Problem: Safety: Goal: Periods of time without injury will increase Outcome: Progressing   Problem: Health Behavior/Discharge Planning: Goal: Identification of resources available to assist in meeting health care needs will improve Outcome: Progressing   Problem: Medication: Goal: Compliance with prescribed medication regimen will improve Outcome: Progressing   Problem: Coping: Goal: Coping ability will improve Outcome: Progressing Goal: Will verbalize feelings Outcome: Progressing   Problem: Self-Concept: Goal: Level of anxiety will decrease Outcome: Progressing

## 2022-07-26 NOTE — BHH Counselor (Addendum)
CSW received a call from [facility]. CSW was informed that pt has been accepted into their program with an admission date of 07/28/22. They asked that he be there at 11:00. No other concerns expressed. Contact ended without incident.   CSW met with pt briefly to update regarding his acceptance. Pt was happy to hear this. No concerns expressed. Contact ended without incident.   Matthew [name]. Matthew [name], MSW, LCSW, LCAS 07/26/2022 9:45 AM

## 2022-07-26 NOTE — Group Note (Signed)
[facility] LCSW Group Therapy Note   Group Date: 07/26/2022 Start Time: 1300 End Time: 1400   Type of Therapy/Topic:  Group Therapy:  Emotion Regulation  Participation Level:  Active   Mood:  Description of Group:    The purpose of this group is to assist patients in learning to regulate negative emotions and experience positive emotions. Patients will be guided to discuss ways in which they have been vulnerable to their negative emotions. These vulnerabilities will be juxtaposed with experiences of positive emotions or situations, and patients challenged to use positive emotions to combat negative ones. Special emphasis will be placed on coping with negative emotions in conflict situations, and patients will process healthy conflict resolution skills.  Therapeutic Goals: Patient will identify two positive emotions or experiences to reflect on in order to balance out negative emotions:  Patient will label two or more emotions that they find the most difficult to experience:  Patient will be able to demonstrate positive conflict resolution skills through discussion or role plays:   Summary of Patient Progress: Patient was present for the entirety of the group session. Patient was an active listener and participated in the topic of discussion, provided helpful advice to others, and added nuance to topic of conversation. Patient shared that he has difficulty with alcohol relapse when he has money. Group worked out Administrator, sports to mitigate potential triggers.     Therapeutic Modalities:   Cognitive Behavioral Therapy Feelings Identification Dialectical Behavioral Therapy   Durenda Hurt, [place]

## 2022-07-26 NOTE — Group Note (Signed)
Recreation Therapy Group Note   Group Topic:Healthy Support Systems  Group Date: 07/26/2022 Start Time: 1000 End Time: 1055 Facilitators: Vilma Prader, LRT, CTRS Location:  Craft Room  Group Description: Straw Bridge. Patients were given 10 plastic drinking straws and an equal length of masking tape. Using the materials provided, patients were instructed to build a free-standing bridge-like structure to suspend an everyday item (ex: deck of cards) off the floor or table surface. All materials were required to be used in Conservation officer, nature. LRT facilitated post-activity discussion reviewing the importance of having strong and healthy support systems in our lives. LRT discussed how the people in our lives serve as the tape and the book we placed on top of our straw structure are the stressors we face in daily life. LRT and pts discussed what happens in our life when things get too heavy for [place], and we don't have strong supports outside of the hospital. Pt shared 2 of their healthy supports aloud in the group.   Affect/Mood: Appropriate   Participation Level: Active and Engaged   Participation Quality: Independent   Behavior: Alert, Appropriate, and Calm   Speech/Thought Process: Coherent   Insight: Good and Improved   Judgement: Good and Improved   Modes of Intervention: Activity and Group work   Patient Response to Interventions:  Attentive, Engaged, Interested , and Receptive   Education Outcome:  Acknowledges education   Clinical Observations/Individualized Feedback: Matthew [name] was active in their participation of session activities and group discussion. Pt identified "my daughter and ex-wife" as his support systems. Pt provided good insight and spontaneously contributed to group discussion multiple times. Pt interacted well with peers and LRT throughout session.   Plan: Continue to engage patient in RT group sessions 2-3x/week.   Vilma Prader, LRT, CTRS 07/26/2022 11:06 AM

## 2022-07-27 ENCOUNTER — Other Ambulatory Visit: Payer: Self-pay

## 2022-07-27 DIAGNOSIS — F332 Major depressive disorder, recurrent severe without psychotic features: Secondary | ICD-10-CM | POA: Diagnosis not present

## 2022-07-27 MED ORDER — NICOTINE POLACRILEX 2 MG MT GUM: 2.0000 mg | CHEWING_GUM | OROMUCOSAL | 1 refills | Status: DC | PRN

## 2022-07-27 MED ORDER — ESCITALOPRAM OXALATE 10 MG PO TABS
10.0000 mg | ORAL_TABLET | Freq: Every day | ORAL | 0 refills | Status: DC
  Filled 2022-07-27: qty 30, 30d supply, fill #0

## 2022-07-27 MED ORDER — HYDROXYZINE HCL 50 MG PO TABS: 50.0000 mg | ORAL_TABLET | Freq: Four times a day (QID) | ORAL | 1 refills | Status: DC | PRN

## 2022-07-27 MED ORDER — FAMOTIDINE 20 MG PO TABS: 20.0000 mg | ORAL_TABLET | Freq: Two times a day (BID) | ORAL | 1 refills | Status: DC

## 2022-07-27 MED ORDER — AMLODIPINE BESYLATE 5 MG PO TABS
10.0000 mg | ORAL_TABLET | Freq: Every day | ORAL | Status: DC
  Administered 2022-07-28: 10 mg via ORAL
  Filled 2022-07-27: qty 2

## 2022-07-27 MED ORDER — TRAZODONE HCL 100 MG PO TABS
100.0000 mg | ORAL_TABLET | Freq: Every evening | ORAL | 0 refills | Status: DC | PRN
  Filled 2022-07-27: qty 30, 30d supply, fill #0

## 2022-07-27 MED ORDER — FAMOTIDINE 20 MG PO TABS
20.0000 mg | ORAL_TABLET | Freq: Two times a day (BID) | ORAL | 0 refills | Status: DC
  Filled 2022-07-27: qty 60, 30d supply, fill #0

## 2022-07-27 MED ORDER — NICOTINE 21 MG/24HR TD PT24: 21.0000 mg | MEDICATED_PATCH | Freq: Every day | TRANSDERMAL | 1 refills | Status: DC

## 2022-07-27 MED ORDER — HYDROXYZINE HCL 50 MG PO TABS
50.0000 mg | ORAL_TABLET | Freq: Four times a day (QID) | ORAL | 0 refills | Status: DC | PRN
  Filled 2022-07-27: qty 30, 8d supply, fill #0

## 2022-07-27 MED ORDER — NICOTINE POLACRILEX 2 MG MT GUM
2.0000 mg | CHEWING_GUM | OROMUCOSAL | 0 refills | Status: DC | PRN
  Filled 2022-07-27: qty 50, 10d supply, fill #0
  Filled 2022-07-27: qty 100, fill #0

## 2022-07-27 MED ORDER — TRAZODONE HCL 100 MG PO TABS: 100.0000 mg | ORAL_TABLET | Freq: Every evening | ORAL | 1 refills | Status: DC | PRN

## 2022-07-27 MED ORDER — AMLODIPINE BESYLATE 10 MG PO TABS: 10.0000 mg | ORAL_TABLET | Freq: Every day | ORAL | 1 refills | Status: DC

## 2022-07-27 MED ORDER — ESCITALOPRAM OXALATE 10 MG PO TABS: 10.0000 mg | ORAL_TABLET | Freq: Every day | ORAL | 1 refills | Status: DC

## 2022-07-27 MED ORDER — NICOTINE 21 MG/24HR TD PT24
21.0000 mg | MEDICATED_PATCH | Freq: Every day | TRANSDERMAL | 0 refills | Status: DC
  Filled 2022-07-27: qty 28, 28d supply, fill #0

## 2022-07-27 MED ORDER — AMLODIPINE BESYLATE 10 MG PO TABS
10.0000 mg | ORAL_TABLET | Freq: Every day | ORAL | 0 refills | Status: DC
  Filled 2022-07-27: qty 30, 30d supply, fill #0

## 2022-07-27 NOTE — Progress Notes (Signed)
Pt denies SI/HI/AVH and verbally agrees to approach staff if these become apparent or before harming themselves/others. Rates depression 0/10. Rates anxiety 0/10. Rates pain 0/10. Pt stated that he had "is what it is" kind of day. Pt stated that he is ready to leave tomorrow. Pt has been out in the milieu all day. Scheduled medications administered to pt, per MD orders. RN provided support and encouragement to pt. Q15 min safety checks implemented and continued. Pt is safe on the unit. Plan of care on going and no other concerns expressed at this time.  07/27/22 0818  Psych Admission Type (Psych Patients Only)  Admission Status Voluntary  Psychosocial Assessment  Patient Complaints None  Eye Contact Fair  Facial Expression Other (Comment) (WDL)  Affect Appropriate to circumstance  Speech Logical/coherent  Interaction Assertive  Motor Activity Slow  Appearance/Hygiene Unremarkable  Behavior Characteristics Cooperative;Appropriate to situation;Calm  Mood Pleasant  Aggressive Behavior  Effect No apparent injury  Thought Process  Coherency WDL  Content WDL  Delusions None reported or observed  Perception WDL  Hallucination None reported or observed  Judgment WDL  Confusion None  Danger to Self  Current suicidal ideation? Denies  Danger to Others  Danger to Others None reported or observed

## 2022-07-27 NOTE — BH IP Treatment Plan (Signed)
Interdisciplinary Treatment and Diagnostic Plan Update  07/27/2022 Time of Session: 08:30 DONSHA GOLIN MRN: [id number]  Principal Diagnosis: MDD (major depressive disorder)  Secondary Diagnoses: Principal Problem:   MDD (major depressive disorder) Active Problems:   Alcohol use disorder, severe, dependence ([place])   Tobacco use disorder   HTN (hypertension), benign   Current Medications:  Current Facility-Administered Medications  Medication Dose Route Frequency Provider Last Rate Last Admin   acetaminophen (TYLENOL) tablet 650 mg  650 mg Oral Q6H PRN Bennett, Christal H, NP       alum & mag hydroxide-simeth (MAALOX/MYLANTA) 200-200-20 MG/5ML suspension 30 mL  30 mL Oral Q4H PRN Bennett, Christal H, NP       amLODipine (NORVASC) tablet 5 mg  5 mg Oral Daily Clapacs, John T, MD   5 mg at 07/27/22 0818   escitalopram (LEXAPRO) tablet 10 mg  10 mg Oral Daily Clapacs, Madie Reno, MD   10 mg at 07/27/22 0818   famotidine (PEPCID) tablet 20 mg  20 mg Oral BID Clapacs, John T, MD   20 mg at 07/27/22 0818   hydrOXYzine (ATARAX) tablet 50 mg  50 mg Oral Q6H PRN Clapacs, John T, MD   50 mg at 07/26/22 2253   magnesium hydroxide (MILK OF MAGNESIA) suspension 30 mL  30 mL Oral Daily PRN Bennett, Christal H, NP       nicotine (NICODERM CQ - dosed in mg/24 hours) patch 21 mg  21 mg Transdermal Daily Clapacs, John T, MD   21 mg at 07/27/22 0820   nicotine polacrilex (NICORETTE) gum 2 mg  2 mg Oral PRN Clapacs, Madie Reno, MD       traZODone (DESYREL) tablet 100 mg  100 mg Oral QHS PRN Clapacs, John T, MD   100 mg at 07/26/22 2253   PTA Medications: Medications Prior to Admission  Medication Sig Dispense Refill Last Dose   amLODipine (NORVASC) 5 MG tablet Take 1 tablet (5 mg total) by mouth daily. 30 tablet 11 Past Month   levETIRAcetam (KEPPRA) 500 MG tablet Take 1 tablet (500 mg total) by mouth 2 (two) times daily. (Patient not taking: Reported on 07/20/2022) 60 tablet 5 Not Taking    Patient Stressors:  Substance abuse   Other: Per patient, "life".    Patient Strengths: Capable of independent living  Marketing executive fund of knowledge  Motivation for treatment/growth  Work skills   Treatment Modalities: Medication Management, Group therapy, Case management,  1 to 1 session with clinician, Psychoeducation, Recreational therapy.   Physician Treatment Plan for Primary Diagnosis: MDD (major depressive disorder) Long Term Goal(s): Improvement in symptoms so as ready for discharge   Short Term Goals: Ability to maintain clinical measurements within normal limits will improve Compliance with prescribed medications will improve Ability to identify triggers associated with substance abuse/mental health issues will improve Ability to disclose and discuss suicidal ideas Ability to demonstrate self-control will improve  Medication Management: Evaluate patient's response, side effects, and tolerance of medication regimen.  Therapeutic Interventions: 1 to 1 sessions, Unit Group sessions and Medication administration.  Evaluation of Outcomes: Progressing  Physician Treatment Plan for Secondary Diagnosis: Principal Problem:   MDD (major depressive disorder) Active Problems:   Alcohol use disorder, severe, dependence ([place])   Tobacco use disorder   HTN (hypertension), benign  Long Term Goal(s): Improvement in symptoms so as ready for discharge   Short Term Goals: Ability to maintain clinical measurements within normal limits will improve Compliance with prescribed  medications will improve Ability to identify triggers associated with substance abuse/mental health issues will improve Ability to disclose and discuss suicidal ideas Ability to demonstrate self-control will improve     Medication Management: Evaluate patient's response, side effects, and tolerance of medication regimen.  Therapeutic Interventions: 1 to 1 sessions, Unit Group sessions and Medication  administration.  Evaluation of Outcomes: Progressing   RN Treatment Plan for Primary Diagnosis: MDD (major depressive disorder) Long Term Goal(s): Knowledge of disease and therapeutic regimen to maintain health will improve  Short Term Goals: Ability to remain free from injury will improve, Ability to verbalize frustration and anger appropriately will improve, Ability to demonstrate self-control, Ability to participate in decision making will improve, Ability to verbalize feelings will improve, Ability to disclose and discuss suicidal ideas, Ability to identify and develop effective coping behaviors will improve, and Compliance with prescribed medications will improve  Medication Management: RN will administer medications as ordered by provider, will assess and evaluate patient's response and provide education to patient for prescribed medication. RN will report any adverse and/or side effects to prescribing provider.  Therapeutic Interventions: 1 on 1 counseling sessions, Psychoeducation, Medication administration, Evaluate responses to treatment, Monitor vital signs and CBGs as ordered, Perform/monitor CIWA, COWS, AIMS and Fall Risk screenings as ordered, Perform wound care treatments as ordered.  Evaluation of Outcomes: Progressing   LCSW Treatment Plan for Primary Diagnosis: MDD (major depressive disorder) Long Term Goal(s): Safe transition to appropriate next level of care at discharge, Engage patient in therapeutic group addressing interpersonal concerns.  Short Term Goals: Engage patient in aftercare planning with referrals and resources, Increase social support, Increase ability to appropriately verbalize feelings, Increase emotional regulation, Facilitate acceptance of mental health diagnosis and concerns, Facilitate patient progression through stages of change regarding substance use diagnoses and concerns, Identify triggers associated with mental health/substance abuse issues, and  Increase skills for wellness and recovery  Therapeutic Interventions: Assess for all discharge needs, 1 to 1 time with Social worker, Explore available resources and support systems, Assess for adequacy in community support network, Educate family and significant other(s) on suicide prevention, Complete Psychosocial Assessment, Interpersonal group therapy.  Evaluation of Outcomes: Progressing   Progress in Treatment: Attending groups: Yes. Participating in groups: Yes. Taking medication as prescribed: Yes. Toleration medication: Yes. Family/Significant other contact made: No, will contact:  if given permission. Patient understands diagnosis: Yes. Discussing patient identified problems/goals with staff: Yes. Medical problems stabilized or resolved: Yes. Denies suicidal/homicidal ideation: Yes. Issues/concerns per patient self-inventory: No. Other: none.  New problem(s) identified: No, Describe:  none Update 07/27/22: No changes at this time.    New Short Term/Long Term Goal(s): detox, elimination of symptoms of psychosis, medication management for mood stabilization; elimination of SI thoughts; development of comprehensive mental wellness/sobriety plan. Update 07/27/22: No changes at this time.   Patient Goals:  "quit drinking that's what's got me depressed" Update 07/27/22: No changes at this time.   Discharge Plan or Barriers: CSW to assist in the development of appropriate discharge plans.  At present patient is in contemplation stage as it relates to treatment.  Patient is unsure if he would like to go to an out patient provider or be referred to inpatient/residential substance abuse treatment. Update 07/27/22: Pt has been accepted at [facility] on tomorrow, 07/28/22. He will need assistance with transportation to get there if he does not have his own.    Reason for Continuation of Hospitalization: Anxiety Depression Medication stabilization Suicidal ideation   Estimated Length of  Stay:  1-5  days Update 07/27/22: Pt discharge set for tomorrow.  Last 3 [place] Suicide Severity Risk Score: Flowsheet Row Admission (Current) from 07/20/2022 in [place] Most recent reading at 07/20/2022  5:53 PM ED from 07/20/2022 in [facility] Emergency Department at [facility] Most recent reading at 07/20/2022  9:40 AM ED from 07/06/2022 in [facility] Emergency Department at [facility] Most recent reading at 07/06/2022  8:29 AM  C-SSRS RISK CATEGORY High Risk High Risk No Risk       Last PHQ 2/9 Scores:     No data to display          Scribe for Treatment Team: Shirl Harris, LCSW 07/27/2022 9:35 AM

## 2022-07-27 NOTE — Group Note (Signed)
LCSW Group Therapy Note   Group Date: 07/27/2022 Start Time: 1300 End Time: 1400   Type of Therapy and Topic:  Group Therapy: Boundaries  Participation Level:  Active  Description of Group: This group will address the use of boundaries in their personal lives. Patients will explore why boundaries are important, the difference between healthy and unhealthy boundaries, and negative and postive outcomes of different boundaries and will look at how boundaries can be crossed.  Patients will be encouraged to identify current boundaries in their own lives and identify what kind of boundary is being set. Facilitators will guide patients in utilizing problem-solving interventions to address and correct types boundaries being used and to address when no boundary is being used. Understanding and applying boundaries will be explored and addressed for obtaining and maintaining a balanced life. Patients will be encouraged to explore ways to assertively make their boundaries and needs known to significant others in their lives, using other group members and facilitator for role play, support, and feedback.  Therapeutic Goals:  1.  Patient will identify areas in their life where setting clear boundaries could be  used to improve their life.  2.  Patient will identify signs/triggers that a boundary is not being respected. 3.  Patient will identify two ways to set boundaries in order to achieve balance in  their lives: 4.  Patient will demonstrate ability to communicate their needs and set boundaries  through discussion and/or role plays  Summary of Patient Progress:  Patient was present and active throughout the session and proved open to feedback from [place] and peers. Patient demonstrated fair insight into the subject matter, was respectful of peers, and was present throughout the entire session.  Therapeutic Modalities:   Cognitive Behavioral Therapy Solution-Focused Therapy  Rozann Lesches,  LCSWA 07/27/2022  2:00 PM

## 2022-07-27 NOTE — Progress Notes (Signed)
[facility] MD Progress Note  07/27/2022 2:16 PM Matthew [name]  MRN:  [id number] Subjective: Patient seen and chart reviewed.  Patient continues to do well.  Mood improved.  Mild anxiety over being discharged tomorrow.  No dangerous behavior and no suicidality.  Thinking clearly. Principal Problem: MDD (major depressive disorder) Diagnosis: Principal Problem:   MDD (major depressive disorder) Active Problems:   Alcohol use disorder, severe, dependence ([place])   Tobacco use disorder   HTN (hypertension), benign  Total Time spent with patient: 20 minutes  Past Psychiatric History: Past history of depression and substance use problems  Past Medical History:  Past Medical History:  Diagnosis Date   Hypertension    Pneumothorax 12/2014    Past Surgical History:  Procedure Laterality Date   APPENDECTOMY     Family History:  Family History  Problem Relation Age of Onset   Diabetes Maternal Grandmother    Family Psychiatric  History: See previous Social History:  Social History   Substance and Sexual Activity  Alcohol Use Yes   Comment: 80 oz beer daily, 1/2 pint liquor daily     Social History   Substance and Sexual Activity  Drug Use No    Social History   Socioeconomic History   Marital status: Divorced    Spouse name: Not on file   Number of children: Not on file   Years of education: Not on file   Highest education level: Not on file  Occupational History   Not on file  Tobacco Use   Smoking status: Every Day    Packs/day: 0.50    Years: 35.00    Total pack years: 17.50    Types: Cigarettes   Smokeless tobacco: Never  Substance and Sexual Activity   Alcohol use: Yes    Comment: 80 oz beer daily, 1/2 pint liquor daily   Drug use: No   Sexual activity: Not Currently    Birth control/protection: None  Other Topics Concern   Not on file  Social History Narrative   Not on file   Social Determinants of Health   Financial Resource Strain: Not on file  Food  Insecurity: No Food Insecurity (07/20/2022)   Hunger Vital Sign    Worried About Running Out of Food in the Last Year: Never true    Ran Out of Food in the Last Year: Never true  Transportation Needs: No Transportation Needs (07/20/2022)   PRAPARE - Hydrologist (Medical): No    Lack of Transportation (Non-Medical): No  Physical Activity: Not on file  Stress: Not on file  Social Connections: Not on file   Additional Social History:                         Sleep: Fair  Appetite:  Fair  Current Medications: Current Facility-Administered Medications  Medication Dose Route Frequency Provider Last Rate Last Admin   acetaminophen (TYLENOL) tablet 650 mg  650 mg Oral Q6H PRN Bennett, Christal H, NP       alum & mag hydroxide-simeth (MAALOX/MYLANTA) 200-200-20 MG/5ML suspension 30 mL  30 mL Oral Q4H PRN Bennett, Christal H, NP       amLODipine (NORVASC) tablet 5 mg  5 mg Oral Daily Rhys Anchondo T, MD   5 mg at 07/27/22 0818   escitalopram (LEXAPRO) tablet 10 mg  10 mg Oral Daily Yazan Gatling, Madie Reno, MD   10 mg at 07/27/22 0818   famotidine (PEPCID)  tablet 20 mg  20 mg Oral BID Cayne Yom T, MD   20 mg at 07/27/22 0818   hydrOXYzine (ATARAX) tablet 50 mg  50 mg Oral Q6H PRN Jorah Hua T, MD   50 mg at 07/26/22 2253   magnesium hydroxide (MILK OF MAGNESIA) suspension 30 mL  30 mL Oral Daily PRN Bennett, Christal H, NP       nicotine (NICODERM CQ - dosed in mg/24 hours) patch 21 mg  21 mg Transdermal Daily Jaecion Dempster T, MD   21 mg at 07/27/22 0820   nicotine polacrilex (NICORETTE) gum 2 mg  2 mg Oral PRN Abubakar Crispo, Madie Reno, MD       traZODone (DESYREL) tablet 100 mg  100 mg Oral QHS PRN Jahon Bart, Madie Reno, MD   100 mg at 07/26/22 2253    Lab Results: No results found for this or any previous visit (from the past 48 hour(s)).  Blood Alcohol level:  Lab Results  Component Value Date   ETH <10 07/20/2022   ETH <10 0000000    Metabolic Disorder  Labs: Lab Results  Component Value Date   HGBA1C 4.4 12/22/2014   No results found for: "PROLACTIN" No results found for: "CHOL", "TRIG", "HDL", "CHOLHDL", "VLDL", "LDLCALC"  Physical Findings: AIMS: Facial and Oral Movements Muscles of Facial Expression: None, normal Lips and Perioral Area: None, normal Jaw: None, normal Tongue: None, normal,Extremity Movements Upper (arms, wrists, hands, fingers): None, normal Lower (legs, knees, ankles, toes): None, normal, Trunk Movements Neck, shoulders, hips: None, normal, Overall Severity Severity of abnormal movements (highest score from questions above): None, normal Incapacitation due to abnormal movements: None, normal Patient's awareness of abnormal movements (rate only patient's report): No Awareness, Dental Status Current problems with teeth and/or dentures?: No Does patient usually wear dentures?: No  CIWA:    COWS:     Musculoskeletal: Strength & Muscle Tone: within normal limits Gait & Station: normal Patient leans: N/A  Psychiatric Specialty Exam:  Presentation  General Appearance: No data recorded Eye Contact:No data recorded Speech:No data recorded Speech Volume:No data recorded Handedness:No data recorded  Mood and Affect  Mood:No data recorded Affect:No data recorded  Thought Process  Thought Processes:No data recorded Descriptions of Associations:No data recorded Orientation:No data recorded Thought Content:No data recorded History of Schizophrenia/Schizoaffective disorder:No data recorded Duration of Psychotic Symptoms:No data recorded Hallucinations:No data recorded Ideas of Reference:No data recorded Suicidal Thoughts:No data recorded Homicidal Thoughts:No data recorded  Sensorium  Memory:No data recorded Judgment:No data recorded Insight:No data recorded  Executive Functions  Concentration:No data recorded Attention Span:No data recorded Recall:No data recorded Fund of Knowledge:No data  recorded Language:No data recorded  Psychomotor Activity  Psychomotor Activity:No data recorded  Assets  Assets:No data recorded  Sleep  Sleep:No data recorded   Physical Exam: Physical Exam Vitals and nursing note reviewed.  Constitutional:      Appearance: Normal appearance.  HENT:     Head: Normocephalic and atraumatic.     Mouth/Throat:     Pharynx: Oropharynx is clear.  Eyes:     Pupils: Pupils are equal, round, and reactive to light.  Cardiovascular:     Rate and Rhythm: Normal rate and regular rhythm.  Pulmonary:     Effort: Pulmonary effort is normal.     Breath sounds: Normal breath sounds.  Abdominal:     General: Abdomen is flat.     Palpations: Abdomen is soft.  Musculoskeletal:        General: Normal range of motion.  Skin:    General: Skin is warm and dry.  Neurological:     General: No focal deficit present.     Mental Status: He is alert. Mental status is at baseline.  Psychiatric:        Attention and Perception: Attention normal.        Mood and Affect: Mood normal.        Speech: Speech normal.        Behavior: Behavior normal.        Thought Content: Thought content normal.        Cognition and Memory: Cognition normal.        Judgment: Judgment normal.    Review of Systems  Constitutional: Negative.   HENT: Negative.    Eyes: Negative.   Respiratory: Negative.    Cardiovascular: Negative.   Gastrointestinal: Negative.   Musculoskeletal: Negative.   Skin: Negative.   Neurological: Negative.   Psychiatric/Behavioral: Negative.     Blood pressure (!) 172/78, pulse 70, temperature 97.7 F (36.5 C), temperature source Oral, resp. rate 16, height '5\' 11"'$  (1.803 m), weight 74.8 kg, SpO2 100 %. Body mass index is 23.01 kg/m.   Treatment Plan Summary: Medication management and Plan no change to medication.  Preparations are underway to allow him to be discharged tomorrow to [facility] where he will have continued substance abuse treatment.   Orders placed for medication.  Alethia Berthold, MD 07/27/2022, 2:16 PM

## 2022-07-27 NOTE — Group Note (Signed)
Recreation Therapy Group Note   Group Topic:Relaxation  Group Date: 07/27/2022 Start Time: 1000 End Time: 1045 Facilitators: Vilma Prader, LRT, CTRS  Location:  C[facility]  Group Description: Mindfulness Body Scan. LRT asked pt their current level of stress and anxiety. LRT educated on the benefits of mindfulness and how it can apply to everyday life post-discharge. LRT and pt's followed along to an audio script of a "mindfulness body scan" video. LRT asked pt their level of stress and anxiety once the prompt was finished.   Affect/Mood: Appropriate and Congruent   Participation Level: Active and Engaged   Participation Quality: Independent   Behavior: Alert, Appropriate, and Calm   Speech/Thought Process: Coherent   Insight: Good   Judgement: Good   Modes of Intervention: Activity and Education   Patient Response to Interventions:  Attentive, Engaged, Interested , and Receptive   Education Outcome:  Acknowledges education   Clinical Observations/Individualized Feedback: Matthew [name] was active in their participation of session activities and group discussion. Pt identified that his anxiety level and stress level were both a 5 out of 10, with 10 being the highest, before the session. Pt rated his anxiety and stress levels a 5 after the session. Pt actively engaged in all prompted exercises during the session. Pt shared that he felt like he was getting a massage.    Plan: Continue to engage patient in RT group sessions 2-3x/week.   Vilma Prader, LRT, CTRS 07/27/2022 10:59 AM

## 2022-07-27 NOTE — Plan of Care (Signed)
  Problem: Education: Goal: Knowledge of General Education information will improve Description: Including pain rating scale, medication(s)/side effects and non-pharmacologic comfort measures Outcome: Progressing   Problem: Activity: Goal: Risk for activity intolerance will decrease Outcome: Progressing   Problem: Nutrition: Goal: Adequate nutrition will be maintained Outcome: Progressing   Problem: Coping: Goal: Level of anxiety will decrease Outcome: Progressing   Problem: Clinical Measurements: Goal: Cardiovascular complication will be avoided Outcome: Not Progressing

## 2022-07-27 NOTE — Progress Notes (Signed)
Patient  compliant with medications interacting well with Peers and Staff. Denies SI/HI/A/VH and verbally contracted for safety. Q 15 minutes safety checks ongoing. Patient remains safe. Support and encouragement provided.

## 2022-07-28 DIAGNOSIS — F332 Major depressive disorder, recurrent severe without psychotic features: Secondary | ICD-10-CM | POA: Diagnosis not present

## 2022-07-28 NOTE — [facility] Group Notes (Signed)
[place] Group Notes:  (Nursing/MHT/Case Management/Adjunct)  Date:  07/28/2022  Time:  9:29 AM  Type of Therapy:   community meeting  Participation Level:  Active  Participation Quality:  Appropriate  Affect:  Appropriate  Cognitive:  Appropriate  Insight:  Appropriate  Engagement in Group:  Engaged  Modes of Intervention:  Discussion and Education  Summary of Progress/Problems:  Antonieta Pert 07/28/2022, 9:29 AM

## 2022-07-28 NOTE — Plan of Care (Signed)
  Problem: Education: Goal: Knowledge of General Education information will improve Description: Including pain rating scale, medication(s)/side effects and non-pharmacologic comfort measures Outcome: Adequate for Discharge   Problem: Nutrition: Goal: Adequate nutrition will be maintained Outcome: Adequate for Discharge   Problem: Coping: Goal: Level of anxiety will decrease Outcome: Adequate for Discharge   Problem: Education: Goal: Emotional status will improve Outcome: Adequate for Discharge Goal: Mental status will improve Outcome: Adequate for Discharge   Problem: Self-Concept: Goal: Level of anxiety will decrease Outcome: Adequate for Discharge

## 2022-07-28 NOTE — Progress Notes (Signed)
Discharge note: Suicide safety plan complete. RN met with pt and reviewed pt's discharge instructions. Pt verbalized understanding of discharge instructions and pt did not have any questions. RN reviewed and provided pt with a copy of SRA, AVS and Transition Record. RN returned pt's belongings to pt. Prescriptions and samples were given to pt. Pt denied SI/HI/AVH and voiced no concerns. Pt was appreciative of the care pt received at [facility]. Patient discharged to the lobby without incident.  07/28/22 [id number]  Psych Admission Type (Psych Patients Only)  Admission Status Voluntary  Psychosocial Assessment  Patient Complaints None  Eye Contact Fair  Facial Expression Animated  Affect Euphoric  Speech Logical/coherent  Interaction Flirtatious;Assertive  Motor Activity Slow  Appearance/Hygiene Unremarkable  Behavior Characteristics Cooperative;Appropriate to situation;Calm  Mood Euphoric;Pleasant  Aggressive Behavior  Effect No apparent injury  Thought Process  Coherency WDL  Content WDL  Delusions None reported or observed  Perception WDL  Hallucination None reported or observed  Judgment WDL  Confusion None  Danger to Self  Current suicidal ideation? Denies  Danger to Others  Danger to Others None reported or observed

## 2022-07-28 NOTE — Progress Notes (Addendum)
  [facility] Adult Case Management Discharge Plan :  Will you be returning to the same living situation after discharge:  Yes,  Patient to discharge to residential substance use program.  At discharge, do you have transportation home?: Yes,  Patient has vehicle on campus, patient to drive to treatment facility.  Do you have the ability to pay for your medications: No. Patient has no listed insurance, to be provided with 7 day supply of medication at discharge. CSW has provided patient with clinic information for free/reduced medications.   Release of information consent forms completed and in the chart;  Patient's signature needed at discharge.  Patient to Follow up at:  Follow-up Information     [place] Follow up.   Why: Your appointment is scheduled for Thursday, 07/28/22 at 11. Thanks! Contact information: [place] [address] [phone]         [place] Schedule an appointment as soon as possible for a visit.   Why: Please schedule appointment with clinic to continue medications. Lanae Boast, peer suport, will follow up with you at [facility]. Contact information: [place] [address] [phone]                 Next level of care provider has access to [place] and Suicide Prevention discussed: Yes,  SPE completed with patient by nursing staff patient declined consent for CSW to reach family/friend.    Has patient been referred to the Quitline?: Patient refused referral Tobacco Use: High Risk (07/20/2022)   Patient History    Smoking Tobacco Use: Every Day    Smokeless Tobacco Use: Never    Passive Exposure: Not on file   Patient has been referred for addiction treatment: Yes Patient accepted to residential substance use treatment, see appointment details.  Social History   Substance and Sexual Activity  Drug Use No   Social History   Substance and Sexual  Activity  Alcohol Use Yes   Comment: 80 oz beer daily, 1/2 pint liquor daily   Durenda Hurt, LCSWA 07/28/2022, 10:12 AM

## 2022-07-28 NOTE — Discharge Summary (Signed)
Physician Discharge Summary Note  Patient:  Matthew [name] is an 62 y.o., male MRN:  [id number] DOB:  01-28-1961 Patient phone:  [phone] (home)  Patient address:   [place] [address],  Total Time spent with patient: 30 minutes  Date of Admission:  07/20/2022 Date of Discharge: 07/28/2022  Reason for Admission: Patient was admitted to the psychiatric unit because of stated suicidal ideation in the context of depressed mood and ongoing substance abuse lack of resources.  Patient was restarted on antidepressant and engaged in appropriate therapy groups and ongoing assessment.  Patient expressed a desire to be in a longer term substance abuse program to try and achieve lasting sobriety.  Treatment team was able to arrange for admission to residential treatment services long-term residential program starting today.  Patient is agreeable.  He will be going to [facility] and will be on his current medication and given a supply as well.  At the time of discharge no evidence of suicidality or psychosis.  Cooperative with plan.  Principal Problem: MDD (major depressive disorder) Discharge Diagnoses: Principal Problem:   MDD (major depressive disorder) Active Problems:   Alcohol use disorder, severe, dependence ([place])   Tobacco use disorder   HTN (hypertension), benign   Past Psychiatric History: See previous  Past Medical History:  Past Medical History:  Diagnosis Date   Hypertension    Pneumothorax 12/2014    Past Surgical History:  Procedure Laterality Date   APPENDECTOMY     Family History:  Family History  Problem Relation Age of Onset   Diabetes Maternal Grandmother    Family Psychiatric  History: See previous Social History:  Social History   Substance and Sexual Activity  Alcohol Use Yes   Comment: 80 oz beer daily, 1/2 pint liquor daily     Social History   Substance and Sexual Activity  Drug Use No    Social History   Socioeconomic History   Marital  status: Divorced    Spouse name: Not on file   Number of children: Not on file   Years of education: Not on file   Highest education level: Not on file  Occupational History   Not on file  Tobacco Use   Smoking status: Every Day    Packs/day: 0.50    Years: 35.00    Additional pack years: 0.00    Total pack years: 17.50    Types: Cigarettes   Smokeless tobacco: Never  Substance and Sexual Activity   Alcohol use: Yes    Comment: 80 oz beer daily, 1/2 pint liquor daily   Drug use: No   Sexual activity: Not Currently    Birth control/protection: None  Other Topics Concern   Not on file  Social History Narrative   Not on file   Social Determinants of Health   Financial Resource Strain: Not on file  Food Insecurity: No Food Insecurity (07/20/2022)   Hunger Vital Sign    Worried About Running Out of Food in the Last Year: Never true    Ran Out of Food in the Last Year: Never true  Transportation Needs: No Transportation Needs (07/20/2022)   PRAPARE - Hydrologist (Medical): No    Lack of Transportation (Non-Medical): No  Physical Activity: Not on file  Stress: Not on file  Social Connections: Not on file    Hospital Course: "It looks like I accidentally put the hospital course up above in the reason for admission but  to just briefly repeat it he was cooperative with restarting an antidepressant and appropriate medical treatment.  He engaged appropriately in therapeutic groups and interviews.  He requested referral to long-term substance abuse treatment and has been accepted to residential treatment services residential program.  He will be discharged today with a plan that he go directly to [facility].  Prescriptions and medicine supplied.  No evidence of dangerousness at discharge.  Physical Findings: AIMS: Facial and Oral Movements Muscles of Facial Expression: None, normal Lips and Perioral Area: None, normal Jaw: None, normal Tongue: None,  normal,Extremity Movements Upper (arms, wrists, hands, fingers): None, normal Lower (legs, knees, ankles, toes): None, normal, Trunk Movements Neck, shoulders, hips: None, normal, Overall Severity Severity of abnormal movements (highest score from questions above): None, normal Incapacitation due to abnormal movements: None, normal Patient's awareness of abnormal movements (rate only patient's report): No Awareness, Dental Status Current problems with teeth and/or dentures?: No Does patient usually wear dentures?: No  CIWA:    COWS:     Musculoskeletal: Strength & Muscle Tone: within normal limits Gait & Station: normal Patient leans: N/A   Psychiatric Specialty Exam:  Presentation  General Appearance: No data recorded Eye Contact:No data recorded Speech:No data recorded Speech Volume:No data recorded Handedness:No data recorded  Mood and Affect  Mood:No data recorded Affect:No data recorded  Thought Process  Thought Processes:No data recorded Descriptions of Associations:No data recorded Orientation:No data recorded Thought Content:No data recorded History of Schizophrenia/Schizoaffective disorder:No data recorded Duration of Psychotic Symptoms:No data recorded Hallucinations:No data recorded Ideas of Reference:No data recorded Suicidal Thoughts:No data recorded Homicidal Thoughts:No data recorded  Sensorium  Memory:No data recorded Judgment:No data recorded Insight:No data recorded  Executive Functions  Concentration:No data recorded Attention Span:No data recorded Recall:No data recorded Fund of Knowledge:No data recorded Language:No data recorded  Psychomotor Activity  Psychomotor Activity:No data recorded  Assets  Assets:No data recorded  Sleep  Sleep:No data recorded   Physical Exam: Physical Exam Vitals and nursing note reviewed.  Constitutional:      Appearance: Normal appearance.  HENT:     Head: Normocephalic and atraumatic.      Mouth/Throat:     Pharynx: Oropharynx is clear.  Eyes:     Pupils: Pupils are equal, round, and reactive to light.  Cardiovascular:     Rate and Rhythm: Normal rate and regular rhythm.  Pulmonary:     Effort: Pulmonary effort is normal.     Breath sounds: Normal breath sounds.  Abdominal:     General: Abdomen is flat.     Palpations: Abdomen is soft.  Musculoskeletal:        General: Normal range of motion.  Skin:    General: Skin is warm and dry.  Neurological:     General: No focal deficit present.     Mental Status: He is alert. Mental status is at baseline.  Psychiatric:        Attention and Perception: Attention normal.        Mood and Affect: Mood is anxious.        Speech: Speech normal.        Behavior: Behavior normal. Behavior is cooperative.        Thought Content: Thought content normal.        Cognition and Memory: Cognition normal.        Judgment: Judgment normal.    Review of Systems  Constitutional: Negative.   HENT: Negative.    Eyes: Negative.   Respiratory: Negative.  Cardiovascular: Negative.   Gastrointestinal: Negative.   Musculoskeletal: Negative.   Skin: Negative.   Neurological: Negative.   Psychiatric/Behavioral: Negative.     Blood pressure (!) 155/83, pulse 88, temperature 97.9 F (36.6 C), temperature source Oral, resp. rate 18, height '5\' 11"'$  (1.803 m), weight 74.8 kg, SpO2 100 %. Body mass index is 23.01 kg/m.   Social History   Tobacco Use  Smoking Status Every Day   Packs/day: 0.50   Years: 35.00   Additional pack years: 0.00   Total pack years: 17.50   Types: Cigarettes  Smokeless Tobacco Never   Tobacco Cessation:  A prescription for an FDA-approved tobacco cessation medication provided at discharge   Blood Alcohol level:  Lab Results  Component Value Date   ETH <10 07/20/2022   ETH <10 0000000    Metabolic Disorder Labs:  Lab Results  Component Value Date   HGBA1C 4.4 12/22/2014   No results found for:  "PROLACTIN" No results found for: "CHOL", "TRIG", "HDL", "CHOLHDL", "VLDL", "[place]"  See Psychiatric Specialty Exam and Suicide Risk Assessment completed by Attending Physician prior to discharge.  Discharge destination:  [facility]  Is patient on multiple antipsychotic therapies at discharge:  No   Has Patient had three or more failed trials of antipsychotic monotherapy by history:  No  Recommended Plan for Multiple Antipsychotic Therapies: NA  Discharge Instructions     Diet - low sodium heart healthy   Complete by: As directed    Increase activity slowly   Complete by: As directed       Allergies as of 07/28/2022   No Known Allergies      Medication List     STOP taking these medications    levETIRAcetam 500 MG tablet Commonly known as: KEPPRA       TAKE these medications      Indication  amLODipine 10 MG tablet Commonly known as: NORVASC Take 1 tablet (10 mg total) by mouth daily. What changed:  medication strength how much to take  Indication: High Blood Pressure Disorder   escitalopram 10 MG tablet Commonly known as: LEXAPRO Take 1 tablet (10 mg total) by mouth daily.  Indication: Major Depressive Disorder   famotidine 20 MG tablet Commonly known as: PEPCID Take 1 tablet (20 mg total) by mouth 2 (two) times daily.  Indication: Gastroesophageal Reflux Disease   hydrOXYzine 50 MG tablet Commonly known as: ATARAX Take 1 tablet (50 mg total) by mouth every 6 (six) hours as needed for anxiety.  Indication: Feeling Anxious   nicotine 21 mg/24hr patch Commonly known as: NICODERM CQ - dosed in mg/24 hours Place 1 patch (21 mg total) onto the skin daily.  Indication: Nicotine Addiction   SM Nicotine Polacrilex 2 MG gum Generic drug: nicotine polacrilex Take 1 each (2 mg total) by mouth as needed for smoking cessation.  Indication: Nicotine Addiction   traZODone 100 MG tablet Commonly known as: DESYREL Take 1 tablet (100 mg total) by mouth at bedtime  as needed for sleep.  Indication: [place] Follow up.   Why: Your appointment is scheduled for Thursday, 07/28/22 at 11. Thanks! Contact information: [place] [address] [phone]                 Follow-up recommendations:  Other:  Patient is directed to go directly to residential treatment services for admission.  Prescriptions and supply  provided.  Comments: Patient agrees to plan  Signed: Alethia Berthold, MD 07/28/2022, 9:38 AM

## 2022-07-28 NOTE — [facility] Suicide Risk Assessment (Signed)
[facility] Discharge Suicide Risk Assessment   Principal Problem: MDD (major depressive disorder) Discharge Diagnoses: Principal Problem:   MDD (major depressive disorder) Active Problems:   Alcohol use disorder, severe, dependence ([place])   Tobacco use disorder   HTN (hypertension), benign   Total Time spent with patient: 30 minutes  Musculoskeletal: Strength & Muscle Tone: within normal limits Gait & Station: normal Patient leans: N/A  Psychiatric Specialty Exam  Presentation  General Appearance: No data recorded Eye Contact:No data recorded Speech:No data recorded Speech Volume:No data recorded Handedness:No data recorded  Mood and Affect  Mood:No data recorded Duration of Depression Symptoms: No data recorded Affect:No data recorded  Thought Process  Thought Processes:No data recorded Descriptions of Associations:No data recorded Orientation:No data recorded Thought Content:No data recorded History of Schizophrenia/Schizoaffective disorder:No data recorded Duration of Psychotic Symptoms:No data recorded Hallucinations:No data recorded Ideas of Reference:No data recorded Suicidal Thoughts:No data recorded Homicidal Thoughts:No data recorded  Sensorium  Memory:No data recorded Judgment:No data recorded Insight:No data recorded  Executive Functions  Concentration:No data recorded Attention Span:No data recorded Recall:No data recorded Fund of Knowledge:No data recorded Language:No data recorded  Psychomotor Activity  Psychomotor Activity:No data recorded  Assets  Assets:No data recorded  Sleep  Sleep:No data recorded  Physical Exam: Physical Exam Vitals and nursing note reviewed.  Constitutional:      Appearance: Normal appearance.  HENT:     Head: Normocephalic and atraumatic.     Mouth/Throat:     Pharynx: Oropharynx is clear.  Eyes:     Pupils: Pupils are equal, round, and reactive to light.  Cardiovascular:     Rate and Rhythm: Normal rate and  regular rhythm.  Pulmonary:     Effort: Pulmonary effort is normal.     Breath sounds: Normal breath sounds.  Abdominal:     General: Abdomen is flat.     Palpations: Abdomen is soft.  Musculoskeletal:        General: Normal range of motion.  Skin:    General: Skin is warm and dry.  Neurological:     General: No focal deficit present.     Mental Status: He is alert. Mental status is at baseline.  Psychiatric:        Attention and Perception: Attention normal.        Mood and Affect: Mood is anxious.        Speech: Speech normal.        Behavior: Behavior normal.        Thought Content: Thought content normal.        Cognition and Memory: Cognition normal.        Judgment: Judgment normal.    Review of Systems  Constitutional: Negative.   HENT: Negative.    Eyes: Negative.   Respiratory: Negative.    Cardiovascular: Negative.   Gastrointestinal: Negative.   Musculoskeletal: Negative.   Skin: Negative.   Neurological: Negative.   Psychiatric/Behavioral: Negative.     Blood pressure (!) 155/83, pulse 88, temperature 97.9 F (36.6 C), temperature source Oral, resp. rate 18, height '5\' 11"'$  (1.803 m), weight 74.8 kg, SpO2 100 %. Body mass index is 23.01 kg/m.  Mental Status Per Nursing Assessment::   On Admission:  Suicidal ideation indicated by patient  Demographic Factors:  Male and Low socioeconomic status  Loss Factors: Financial problems/change in socioeconomic status  Historical Factors: NA  Risk Reduction Factors:   Positive social support and Positive therapeutic relationship  Continued Clinical Symptoms:  Depression:   Comorbid  alcohol abuse/dependence Alcohol/Substance Abuse/Dependencies  Cognitive Features That Contribute To Risk:  None    Suicide Risk:  Minimal: No identifiable suicidal ideation.  Patients presenting with no risk factors but with morbid ruminations; may be classified as minimal risk based on the severity of the depressive  symptoms   Follow-up Information     [place].   Why: Your appointment is scheduled for Thursday, 07/28/22 at 11. Thanks! Contact information: [place] [address] [phone]                 Plan Of Care/Follow-up recommendations:  Other:  Patient is calm and clear in his thinking and denies suicidal ideation and is looking forward to the treatment plan.  He will be going to residential treatment services for long-term residential rehab follow-up.  Prescriptions and supply provided.  Alethia Berthold, MD 07/28/2022, 9:35 AM

## 2022-10-05 ENCOUNTER — Other Ambulatory Visit: Payer: Self-pay

## 2022-10-13 ENCOUNTER — Ambulatory Visit: Payer: Self-pay | Admitting: Gerontology

## 2022-10-13 ENCOUNTER — Other Ambulatory Visit: Payer: Self-pay

## 2022-10-13 ENCOUNTER — Encounter: Payer: Self-pay | Admitting: Gerontology

## 2022-10-13 VITALS — BP 119/75 | HR 59 | Temp 97.6°F | Resp 16 | Ht 70.0 in | Wt 190.2 lb

## 2022-10-13 DIAGNOSIS — Z1211 Encounter for screening for malignant neoplasm of colon: Secondary | ICD-10-CM | POA: Insufficient documentation

## 2022-10-13 DIAGNOSIS — F322 Major depressive disorder, single episode, severe without psychotic features: Secondary | ICD-10-CM

## 2022-10-13 DIAGNOSIS — I10 Essential (primary) hypertension: Secondary | ICD-10-CM

## 2022-10-13 DIAGNOSIS — Z7689 Persons encountering health services in other specified circumstances: Secondary | ICD-10-CM

## 2022-10-13 MED ORDER — AMLODIPINE BESYLATE 10 MG PO TABS
10.0000 mg | ORAL_TABLET | Freq: Every day | ORAL | 0 refills | Status: DC
  Filled 2022-10-13: qty 30, 30d supply, fill #0

## 2022-10-13 NOTE — Patient Instructions (Addendum)
DASH Eating Plan DASH stands for Dietary Approaches to Stop Hypertension. The DASH eating plan is a healthy eating plan that has been shown to: Lower high blood pressure (hypertension). Reduce your risk for type 2 diabetes, heart disease, and stroke. Help with weight loss. What are tips for following this plan? Reading food labels Check food labels for the amount of salt (sodium) per serving. Choose foods with less than 5 percent of the Daily Value (DV) of sodium. In general, foods with less than 300 milligrams (mg) of sodium per serving fit into this eating plan. To find whole grains, look for the word "whole" as the first word in the ingredient list. Shopping Buy products labeled as "low-sodium" or "no salt added." Buy fresh foods. Avoid canned foods and pre-made or frozen meals. Cooking Try not to add salt when you cook. Use salt-free seasonings or herbs instead of table salt or sea salt. Check with your health care provider or pharmacist before using salt substitutes. Do not fry foods. Cook foods in healthy ways, such as baking, boiling, grilling, roasting, or broiling. Cook using oils that are good for your heart. These include olive, canola, avocado, soybean, and sunflower oil. Meal planning  Eat a balanced diet. This should include: 4 or more servings of fruits and 4 or more servings of vegetables each day. Try to fill half of your plate with fruits and vegetables. 6-8 servings of whole grains each day. 6 or less servings of lean meat, poultry, or fish each day. 1 oz is 1 serving. A 3 oz (85 g) serving of meat is about the same size as the palm of your hand. One egg is 1 oz (28 g). 2-3 servings of low-fat dairy each day. One serving is 1 cup (237 mL). 1 serving of nuts, seeds, or beans 5 times each week. 2-3 servings of heart-healthy fats. Healthy fats called omega-3 fatty acids are found in foods such as walnuts, flaxseeds, fortified milks, and eggs. These fats are also found in  cold-water fish, such as sardines, salmon, and mackerel. Limit how much you eat of: Canned or prepackaged foods. Food that is high in trans fat, such as fried foods. Food that is high in saturated fat, such as fatty meat. Desserts and other sweets, sugary drinks, and other foods with added sugar. Full-fat dairy products. Do not salt foods before eating. Do not eat more than 4 egg yolks a week. Try to eat at least 2 vegetarian meals a week. Eat more home-cooked food and less restaurant, buffet, and fast food. Lifestyle When eating at a restaurant, ask if your food can be made with less salt or no salt. If you drink alcohol: Limit how much you have to: 0-1 drink a day if you are male. 0-2 drinks a day if you are male. Know how much alcohol is in your drink. In the [place] one drink is one 12 oz bottle of beer (355 mL), one 5 oz glass of wine (148 mL), or one 1 oz glass of hard liquor (44 mL). General information Avoid eating more than 2,300 mg of salt a day. If you have hypertension, you may need to reduce your sodium intake to 1,500 mg a day. Work with your provider to stay at a healthy body weight or lose weight. Ask what the best weight range is for you. On most days of the week, get at least 30 minutes of exercise that causes your heart to beat faster. This may include walking, swimming, or  biking. Work with your provider or dietitian to adjust your eating plan to meet your specific calorie needs. What foods should I eat? Fruits All fresh, dried, or frozen fruit. Canned fruits that are in their natural juice and do not have sugar added to them. Vegetables Fresh or frozen vegetables that are raw, steamed, roasted, or grilled. Low-sodium or reduced-sodium tomato and vegetable juice. Low-sodium or reduced-sodium tomato sauce and tomato paste. Low-sodium or reduced-sodium canned vegetables. Grains Whole-grain or whole-wheat bread. Whole-grain or whole-wheat pasta. Brown rice. Orpah Cobb. Bulgur. Whole-grain and low-sodium cereals. Pita bread. Low-fat, low-sodium crackers. Whole-wheat flour tortillas. Meats and other proteins Skinless chicken or [place]. Ground chicken or [place]. Pork with fat trimmed off. Fish and seafood. Egg whites. Dried beans, peas, or lentils. Unsalted nuts, nut butters, and seeds. Unsalted canned beans. Lean cuts of beef with fat trimmed off. Low-sodium, lean precooked or cured meat, such as sausages or meat loaves. Dairy Low-fat (1%) or fat-free (skim) milk. Reduced-fat, low-fat, or fat-free cheeses. Nonfat, low-sodium ricotta or cottage cheese. Low-fat or nonfat yogurt. Low-fat, low-sodium cheese. Fats and oils Soft margarine without trans fats. Vegetable oil. Reduced-fat, low-fat, or light mayonnaise and salad dressings (reduced-sodium). Canola, safflower, olive, avocado, soybean, and sunflower oils. Avocado. Seasonings and condiments Herbs. Spices. Seasoning mixes without salt. Other foods Unsalted popcorn and pretzels. Fat-free sweets. The items listed above may not be all the foods and drinks you can have. Talk to a dietitian to learn more. What foods should I avoid? Fruits Canned fruit in a light or heavy syrup. Fried fruit. Fruit in cream or butter sauce. Vegetables Creamed or fried vegetables. Vegetables in a cheese sauce. Regular canned vegetables that are not marked as low-sodium or reduced-sodium. Regular canned tomato sauce and paste that are not marked as low-sodium or reduced-sodium. Regular tomato and vegetable juices that are not marked as low-sodium or reduced-sodium. Rosita Fire. Olives. Grains Baked goods made with fat, such as croissants, muffins, or some breads. Dry pasta or rice meal packs. Meats and other proteins Fatty cuts of meat. Ribs. Fried meat. Tomasa Blase. Bologna, salami, and other precooked or cured meats, such as sausages or meat loaves, that are not lean and low in sodium. Fat from the back of a pig (fatback). Bratwurst.  Salted nuts and seeds. Canned beans with added salt. Canned or smoked fish. Whole eggs or egg yolks. Chicken or [place] with skin. Dairy Whole or 2% milk, cream, and half-and-half. Whole or full-fat cream cheese. Whole-fat or sweetened yogurt. Full-fat cheese. Nondairy creamers. Whipped toppings. Processed cheese and cheese spreads. Fats and oils Butter. Stick margarine. Lard. Shortening. Ghee. Bacon fat. Tropical oils, such as coconut, palm kernel, or palm oil. Seasonings and condiments Onion salt, garlic salt, seasoned salt, table salt, and sea salt. Worcestershire sauce. Tartar sauce. Barbecue sauce. Teriyaki sauce. Soy sauce, including reduced-sodium soy sauce. Steak sauce. Canned and packaged gravies. Fish sauce. Oyster sauce. Cocktail sauce. Store-bought horseradish. Ketchup. Mustard. Meat flavorings and tenderizers. Bouillon cubes. Hot sauces. Pre-made or packaged marinades. Pre-made or packaged taco seasonings. Relishes. Regular salad dressings. Other foods Salted popcorn and pretzels. The items listed above may not be all the foods and drinks you should avoid. Talk to a dietitian to learn more. Where to find more information National Heart, Lung, and Blood Institute (NHLBI): BuffaloDryCleaner.gl American Heart Association (AHA): heart.org Academy of Nutrition and Dietetics: eatright.org National Kidney Foundation (NKF): kidney.org This information is not intended to replace advice given to you by your health care provider. Make sure  you discuss any questions you have with your health care provider. Document Revised: 05/19/2022 Document Reviewed: 05/19/2022 Elsevier Patient Education  2024 Elsevier Inc. Smoking Tobacco Information, Adult Smoking tobacco can be harmful to your health. Tobacco contains a toxic colorless chemical called nicotine. Nicotine causes changes in your brain that make you want more and more. This is called addiction. This can make it hard to stop smoking once you start.  Tobacco also has other toxic chemicals that can hurt your body and raise your risk of many cancers. Menthol or "lite" tobacco or cigarette brands are not safer than regular brands. How can smoking tobacco affect me? Smoking tobacco puts you at risk for: Cancer. Smoking is most commonly associated with lung cancer, but can also lead to cancer in other parts of the body. Chronic obstructive pulmonary disease (COPD). This is a long-term lung condition that makes it hard to breathe. It also gets worse over time. High blood pressure (hypertension), heart disease, stroke, heart attack, and lung infections, such as pneumonia. Cataracts. This is when the lenses in the eyes become clouded. Digestive problems. This may include peptic ulcers, heartburn, and gastroesophageal reflux disease (GERD). Oral health problems, such as gum disease, mouth sores, and tooth loss. Loss of taste and smell. Smoking also affects how you look and smell. Smoking may cause: Wrinkles. Yellow or stained teeth, fingers, and fingernails. Bad breath. Bad-smelling clothes and hair. Smoking tobacco can also affect your social life, because: It may be challenging to find places to smoke when away from home. Many workplaces, [employer], hotels, and public places are tobacco-free. Smoking is expensive. This is due to the cost of tobacco and the long-term costs of treating health problems from smoking. Secondhand smoke may affect those around you. Secondhand smoke can cause lung cancer, breathing problems, and heart disease. Children of smokers have a higher risk for: Sudden infant death syndrome (SIDS). Ear infections. Lung infections. What actions can I take to prevent health problems? Quit smoking  Do not start smoking. Quit if you already smoke. Do not replace cigarette smoking with vaping devices, such as e-cigarettes. Make a plan to quit smoking and commit to it. Look for programs to help you, and ask your health care  provider for recommendations and ideas. Set a date and write down all the reasons you want to quit. Let your friends and family know you are quitting so they can help and support you. Consider finding friends who also want to quit. It can be easier to quit with someone else, so that you can support each other. Talk with your health care provider about using nicotine replacement medicines to help you quit. These include gum, lozenges, patches, sprays, or pills. If you try to quit but return to smoking, stay positive. It is common to slip up when you first quit, so take it one day at a time. Be prepared for cravings. When you feel the urge to smoke, chew gum or suck on hard candy. Lifestyle Stay busy. Take care of your body. Get plenty of exercise, eat a healthy diet, and drink plenty of water. Find ways to manage your stress, such as meditation, yoga, exercise, or time spent with friends and family. Ask your health care provider about having regular tests (screenings) to check for cancer. This may include blood tests, imaging tests, and other tests. Where to find support To get support to quit smoking, consider: Asking your health care provider for more information and resources. Joining a support group for  people who want to quit smoking in your local community. There are many effective programs that may help you to quit. Calling the smokefree.gov counselor helpline at 1-800-QUIT-NOW (669) 124-9471). Where to find more information You may find more information about quitting smoking from: Centers for Disease Control and Prevention: http://www.osborne.com/ BankRights.uy: smokefree.gov American Lung Association: freedomfromsmoking.org Contact a health care provider if: You have problems breathing. Your lips, nose, or fingers turn blue. You have chest pain. You are coughing up blood. You feel like you will faint. You have other health changes that cause you to worry. Summary Smoking tobacco can  negatively affect your health, the health of those around you, your finances, and your social life. Do not start smoking. Quit if you already smoke. If you need help quitting, ask your health care provider. Consider joining a support group for people in your local community who want to quit smoking. There are many effective programs that may help you to quit. This information is not intended to replace advice given to you by your health care provider. Make sure you discuss any questions you have with your health care provider. Document Revised: 04/27/2021 Document Reviewed: 04/27/2021 Elsevier Patient Education  2024 [employer].

## 2022-10-13 NOTE — Progress Notes (Signed)
New Patient Office Visit  Subjective    Patient ID: Matthew [name], male    DOB: [birth year]  Age: 62 y.o. MRN: [id number]  CC: No chief complaint on file.   HPI Matthew [name] is 62 year old male who has history of hypertension, pneumothorax, anxiety, and depression who presents to establish care. He takes Amlodipine 10 mg daily  for hypertension and denies peripheral edema. He smokes 1/2 pack cigarette daily and denies the desire to quit. He has a history of anxiety and depression, takes Lexapro 10 mg daily, states that his mood is good, denies suicidal and homicidal ideation and follows up at [facility] for his mental care. He denies any pain, SOB, fever/chills.  Overall, he states that he is doing well and offers no further complaint.   GAD 7 =1 PHQ =9  Outpatient Encounter Medications as of 10/13/2022  Medication Sig   amLODipine (NORVASC) 10 MG tablet Take 1 tablet (10 mg total) by mouth daily.   escitalopram (LEXAPRO) 10 MG tablet Take 1 tablet (10 mg total) by mouth daily.   famotidine (PEPCID) 20 MG tablet Take 1 tablet (20 mg total) by mouth 2 (two) times daily.   hydrOXYzine (ATARAX) 50 MG tablet Take 1 tablet (50 mg total) by mouth every 6 (six) hours as needed for anxiety.   nicotine (NICODERM CQ - DOSED IN MG/24 HOURS) 21 mg/24hr patch Place 1 patch (21 mg total) onto the skin daily.   nicotine polacrilex (NICORETTE) 2 MG gum Take 1 each (2 mg total) by mouth as needed for smoking cessation.   traZODone (DESYREL) 100 MG tablet Take 1 tablet (100 mg total) by mouth at bedtime as needed for sleep.   No facility-administered encounter medications on file as of 10/13/2022.    Past Medical History:  Diagnosis Date   Hypertension    Pneumothorax 12/2014    Past Surgical History:  Procedure Laterality Date   APPENDECTOMY      Family History  Problem Relation Age of Onset   Diabetes Maternal Grandmother     Social History   Socioeconomic History   Marital status: Divorced     Spouse name: Not on file   Number of children: Not on file   Years of education: Not on file   Highest education level: Not on file  Occupational History   Not on file  Tobacco Use   Smoking status: Every Day    Packs/day: 0.50    Years: 35.00    Additional pack years: 0.00    Total pack years: 17.50    Types: Cigarettes   Smokeless tobacco: Never  Substance and Sexual Activity   Alcohol use: Yes    Comment: 80 oz beer daily, 1/2 pint liquor daily   Drug use: No   Sexual activity: Not Currently    Birth control/protection: None  Other Topics Concern   Not on file  Social History Narrative   Not on file   Social Determinants of Health   Financial Resource Strain: Not on file  Food Insecurity: No Food Insecurity (07/20/2022)   Hunger Vital Sign    Worried About Running Out of Food in the Last Year: Never true    Ran Out of Food in the Last Year: Never true  Transportation Needs: No Transportation Needs (07/20/2022)   PRAPARE - Administrator, Civil Service (Medical): No    Lack of Transportation (Non-Medical): No  Physical Activity: Not on file  Stress: Not on  file  Social Connections: Not on file  Intimate Partner Violence: Not At Risk (07/20/2022)   Humiliation, Afraid, Rape, and Kick questionnaire    Fear of Current or Ex-Partner: No    Emotionally Abused: No    Physically Abused: No    Sexually Abused: No    Review of Systems  Constitutional: Negative.   HENT: Negative.    Eyes:  Negative for blurred vision (wears glasses to read).  Respiratory: Negative.    Cardiovascular: Negative.   Gastrointestinal: Negative.   Genitourinary: Negative.   Musculoskeletal: Negative.   Skin: Negative.   Neurological: Negative.   Psychiatric/Behavioral: Negative.          Objective    There were no vitals taken for this visit.  Physical Exam Constitutional:      Appearance: Normal appearance.  HENT:     Head: Normocephalic.     Nose: Nose normal.  Eyes:      Extraocular Movements: Extraocular movements intact.     Conjunctiva/sclera: Conjunctivae normal.     Pupils: Pupils are equal, round, and reactive to light.  Cardiovascular:     Rate and Rhythm: Normal rate and regular rhythm.     Pulses: Normal pulses.     Heart sounds: Normal heart sounds.  Pulmonary:     Effort: Pulmonary effort is normal.  Abdominal:     General: Bowel sounds are normal.     Palpations: Abdomen is soft.  Genitourinary:    Comments: Deferred per patient Musculoskeletal:        General: Normal range of motion.     Cervical back: Normal range of motion.  Skin:    General: Skin is warm.     Capillary Refill: Capillary refill takes less than 2 seconds.  Neurological:     General: No focal deficit present.     Mental Status: He is alert and oriented to person, place, and time.  Psychiatric:        Mood and Affect: Mood normal.        Behavior: Behavior normal.        Thought Content: Thought content normal.        Judgment: Judgment normal.         Assessment & Plan:  1.Encounter to establish care. Routine labs will be checked CBC, Cmet, Lipid and panel HgbA1C today  2. History of Hypertension His blood pressure is under control, will continue his medications, Norvasc 10 mg daily. He was educated on signs and symptoms of Stroke, and advised to go to the ED for worsening symptoms.   2. History anxiety and depression .History of depression - His depression is under control,  PhQ9 =9 GAD 7=1. he will continue current medication. Lexapro 10 mg daily.He was advised to call the Crisis helpline with worsening symptoms. -     Return in about 2 weeks (around 10/27/2022), or if symptoms worsen or fail to improve.     Mickie Hillier, FNP

## 2022-10-14 ENCOUNTER — Other Ambulatory Visit: Payer: Self-pay

## 2022-10-14 LAB — COMPREHENSIVE METABOLIC PANEL
ALT: 15 IU/L (ref 0–44)
AST: 20 IU/L (ref 0–40)
Albumin/Globulin Ratio: 1.7 (ref 1.2–2.2)
Albumin: 4.6 g/dL (ref 3.8–4.9)
Alkaline Phosphatase: 133 IU/L — ABNORMAL HIGH (ref 44–121)
BUN/Creatinine Ratio: 12 (ref 9–20)
BUN: 10 mg/dL (ref 6–24)
Bilirubin Total: 1.3 mg/dL — ABNORMAL HIGH (ref 0.0–1.2)
CO2: 24 mmol/L (ref 20–29)
Calcium: 9.8 mg/dL (ref 8.7–10.2)
Chloride: 103 mmol/L (ref 96–106)
Creatinine, Ser: 0.82 mg/dL (ref 0.76–1.27)
Globulin, Total: 2.7 g/dL (ref 1.5–4.5)
Glucose: 85 mg/dL (ref 70–99)
Potassium: 4.5 mmol/L (ref 3.5–5.2)
Sodium: 139 mmol/L (ref 134–144)
Total Protein: 7.3 g/dL (ref 6.0–8.5)
eGFR: 101 mL/min/{1.73_m2} (ref >59)

## 2022-10-14 LAB — CBC WITH DIFFERENTIAL/PLATELET
Basophils Absolute: 0 10*3/uL (ref 0.0–0.2)
Basos: 1 %
EOS (ABSOLUTE): 0.2 10*3/uL (ref 0.0–0.4)
Eos: 3 %
Hematocrit: 39.6 % (ref 37.5–51.0)
Hemoglobin: 13.3 g/dL (ref 13.0–17.7)
Immature Grans (Abs): 0 10*3/uL (ref 0.0–0.1)
Immature Granulocytes: 0 %
Lymphocytes Absolute: 1.7 10*3/uL (ref 0.7–3.1)
Lymphs: 30 %
MCH: 30.7 pg (ref 26.6–33.0)
MCHC: 33.6 g/dL (ref 31.5–35.7)
MCV: 92 fL (ref 79–97)
Monocytes Absolute: 0.5 10*3/uL (ref 0.1–0.9)
Monocytes: 8 %
Neutrophils Absolute: 3.3 10*3/uL (ref 1.4–7.0)
Neutrophils: 58 %
Platelets: 252 10*3/uL (ref 150–450)
RBC: 4.33 x10E6/uL (ref 4.14–5.80)
RDW: 11.2 % — ABNORMAL LOW (ref 11.6–15.4)
WBC: 5.7 10*3/uL (ref 3.4–10.8)

## 2022-10-14 LAB — LIPID PANEL
Chol/HDL Ratio: 3.2 ratio (ref 0.0–5.0)
Cholesterol, Total: 193 mg/dL (ref 100–199)
HDL: 60 mg/dL (ref >39)
LDL Chol Calc (NIH): 120 mg/dL — ABNORMAL HIGH (ref 0–99)
Triglycerides: 72 mg/dL (ref 0–149)
VLDL Cholesterol Cal: 13 mg/dL (ref 5–40)

## 2022-10-18 ENCOUNTER — Other Ambulatory Visit: Payer: Self-pay

## 2022-10-18 ENCOUNTER — Telehealth: Payer: Self-pay | Admitting: *Deleted

## 2022-10-18 ENCOUNTER — Other Ambulatory Visit: Payer: Self-pay | Admitting: *Deleted

## 2022-10-18 DIAGNOSIS — Z1211 Encounter for screening for malignant neoplasm of colon: Secondary | ICD-10-CM

## 2022-10-18 MED ORDER — PEG 3350-KCL-NABCB-NACL-NASULF 236 G PO SOLR
4000.0000 mL | Freq: Once | ORAL | 0 refills | Status: AC
  Filled 2022-10-18: qty 4000, 1d supply, fill #0

## 2022-10-18 MED ORDER — NA SULFATE-K SULFATE-MG SULF 17.5-3.13-1.6 GM/177ML PO SOLN
1.0000 | Freq: Once | ORAL | 0 refills | Status: DC
  Filled 2022-10-18: qty 354, 1d supply, fill #0

## 2022-10-18 NOTE — Telephone Encounter (Signed)
Gastroenterology Pre-Procedure Review  Request Date: 10/28/2022  Requesting Physician: Dr. Tobi Bastos  PATIENT REVIEW QUESTIONS: The patient responded to the following health history questions as indicated:    1. Are you having any GI issues? no 2. Do you have a personal history of Polyps? no 3. Do you have a family history of Colon Cancer or Polyps? no 4. Diabetes Mellitus? no 5. Joint replacements in the past 12 months?no 6. Major health problems in the past 3 months?no 7. Any artificial heart valves, MVP, or defibrillator?no    MEDICATIONS & ALLERGIES:    Patient reports the following regarding taking any anticoagulation/antiplatelet therapy:   Plavix, Coumadin, Eliquis, Xarelto, Lovenox, Pradaxa, Brilinta, or Effient? no Aspirin? no  Patient confirms/reports the following medications:  Current Outpatient Medications  Medication Sig Dispense Refill   amLODipine (NORVASC) 10 MG tablet Take 1 tablet (10 mg total) by mouth daily. 30 tablet 0   escitalopram (LEXAPRO) 10 MG tablet Take 1 tablet (10 mg total) by mouth daily. (Patient not taking: Reported on 10/13/2022) 30 tablet 0   hydrOXYzine (ATARAX) 50 MG tablet Take 1 tablet (50 mg total) by mouth every 6 (six) hours as needed for anxiety. (Patient not taking: Reported on 10/13/2022) 30 tablet 0   traZODone (DESYREL) 100 MG tablet Take 1 tablet (100 mg total) by mouth at bedtime as needed for sleep. (Patient not taking: Reported on 10/13/2022) 30 tablet 0   No current facility-administered medications for this visit.    Patient confirms/reports the following allergies:  No Known Allergies  No orders of the defined types were placed in this encounter.   AUTHORIZATION INFORMATION Primary Insurance: 1D#: Group #:  Secondary Insurance: 1D#: Group #:  SCHEDULE INFORMATION: Date: 10/28/2022 Time: Location:  [facility]

## 2022-10-19 ENCOUNTER — Other Ambulatory Visit: Payer: Self-pay

## 2022-10-19 MED ORDER — HYDROXYZINE HCL 25 MG PO TABS
25.0000 mg | ORAL_TABLET | Freq: Every evening | ORAL | 1 refills | Status: DC | PRN
  Filled 2022-10-19: qty 60, 30d supply, fill #0

## 2022-10-19 MED ORDER — ESCITALOPRAM OXALATE 10 MG PO TABS
10.0000 mg | ORAL_TABLET | Freq: Every day | ORAL | 1 refills | Status: DC
  Filled 2022-10-19: qty 30, 30d supply, fill #0
  Filled 2022-12-13: qty 30, 30d supply, fill #1

## 2022-10-28 ENCOUNTER — Encounter
Admission: RE | Disposition: A | Discharge status: Home / Self Care | Payer: Self-pay | Source: Home / Self Care | Attending: Gastroenterology

## 2022-10-28 ENCOUNTER — Ambulatory Visit: Payer: Self-pay | Admitting: General Practice

## 2022-10-28 ENCOUNTER — Ambulatory Visit
Admission: RE | Admit: 2022-10-28 | Discharge: 2022-10-28 | Disposition: A | Discharge status: Home / Self Care | Payer: Self-pay | Attending: Gastroenterology | Admitting: Gastroenterology

## 2022-10-28 DIAGNOSIS — D122 Benign neoplasm of ascending colon: Secondary | ICD-10-CM | POA: Insufficient documentation

## 2022-10-28 DIAGNOSIS — D126 Benign neoplasm of colon, unspecified: Secondary | ICD-10-CM

## 2022-10-28 DIAGNOSIS — F17210 Nicotine dependence, cigarettes, uncomplicated: Secondary | ICD-10-CM | POA: Insufficient documentation

## 2022-10-28 DIAGNOSIS — D123 Benign neoplasm of transverse colon: Secondary | ICD-10-CM | POA: Insufficient documentation

## 2022-10-28 DIAGNOSIS — Z1211 Encounter for screening for malignant neoplasm of colon: Secondary | ICD-10-CM | POA: Insufficient documentation

## 2022-10-28 DIAGNOSIS — K5730 Diverticulosis of large intestine without perforation or abscess without bleeding: Secondary | ICD-10-CM | POA: Insufficient documentation

## 2022-10-28 HISTORY — PX: COLONOSCOPY WITH PROPOFOL: SHX5780

## 2022-10-28 SURGERY — COLONOSCOPY WITH PROPOFOL
Anesthesia: General

## 2022-10-28 MED ORDER — PROPOFOL 500 MG/50ML IV EMUL
INTRAVENOUS | Status: DC | PRN
  Administered 2022-10-28: 150 ug/kg/min via INTRAVENOUS

## 2022-10-28 MED ORDER — SODIUM CHLORIDE 0.9 % IV SOLN
INTRAVENOUS | Status: DC
  Administered 2022-10-28: 20 mL/h via INTRAVENOUS

## 2022-10-28 MED ORDER — LIDOCAINE HCL (CARDIAC) PF 100 MG/5ML IV SOSY
PREFILLED_SYRINGE | INTRAVENOUS | Status: DC | PRN
  Administered 2022-10-28: 50 mg via INTRAVENOUS

## 2022-10-28 MED ORDER — PROPOFOL 10 MG/ML IV BOLUS
INTRAVENOUS | Status: DC | PRN
  Administered 2022-10-28: 60 mg via INTRAVENOUS
  Administered 2022-10-28: 40 mg via INTRAVENOUS

## 2022-10-28 MED ORDER — PROPOFOL 1000 MG/100ML IV EMUL
INTRAVENOUS | Status: AC
  Filled 2022-10-28: qty 100

## 2022-10-28 NOTE — Anesthesia Procedure Notes (Signed)
Date/Time: 10/28/2022 9:48 AM  Performed by: Elmarie Mainland, CRNAPre-anesthesia Checklist: Patient identified, Emergency Drugs available, Suction available and Patient being monitored Patient Re-evaluated:Patient Re-evaluated prior to induction Oxygen Delivery Method: Nasal cannula

## 2022-10-28 NOTE — Anesthesia Preprocedure Evaluation (Signed)
Anesthesia Evaluation  Patient identified by MRN, date of birth, ID band Patient awake    Reviewed: Allergy & Precautions, NPO status , Patient's Chart, lab work & pertinent test results  Airway Mallampati: III  TM Distance: >3 FB Neck ROM: full    Dental  (+) Chipped, Dental Advidsory Given   Pulmonary neg pulmonary ROS, Current SmokerPatient did not abstain from smoking.   Pulmonary exam normal        Cardiovascular hypertension, negative cardio ROS Normal cardiovascular exam     Neuro/Psych  PSYCHIATRIC DISORDERS  Depression    negative neurological ROS     GI/Hepatic negative GI ROS, Neg liver ROS,,,  Endo/Other  negative endocrine ROS    Renal/GU negative Renal ROS  negative genitourinary   Musculoskeletal   Abdominal   Peds  Hematology  (+) Blood dyscrasia, anemia   Anesthesia Other Findings Past Medical History: No date: Hypertension 12/15/2014: Pneumothorax No date: Substance abuse (HCC)     Comment:  alcohol  Past Surgical History: No date: APPENDECTOMY  BMI    Body Mass Index: 26.39 kg/m      Reproductive/Obstetrics negative OB ROS                             Anesthesia Physical Anesthesia Plan  ASA: 2  Anesthesia Plan: General   Post-op Pain Management: Minimal or no pain anticipated   Induction: Intravenous  PONV Risk Score and Plan: 3 and Propofol infusion, TIVA and Ondansetron  Airway Management Planned: Nasal Cannula  Additional Equipment: None  Intra-op Plan:   Post-operative Plan:   Informed Consent: I have reviewed the patients History and Physical, chart, labs and discussed the procedure including the risks, benefits and alternatives for the proposed anesthesia with the patient or authorized representative who has indicated his/her understanding and acceptance.     Dental advisory given  Plan Discussed with: CRNA and Surgeon  Anesthesia  Plan Comments: (Discussed risks of anesthesia with patient, including possibility of difficulty with spontaneous ventilation under anesthesia necessitating airway intervention, PONV, and rare risks such as cardiac or respiratory or neurological events, and allergic reactions. Discussed the role of CRNA in patient's perioperative care. Patient understands.)       Anesthesia Quick Evaluation

## 2022-10-28 NOTE — Anesthesia Postprocedure Evaluation (Signed)
Anesthesia Post Note  Patient: Matthew [name]  Procedure(s) Performed: COLONOSCOPY WITH PROPOFOL  Patient location during evaluation: Endoscopy Anesthesia Type: General Level of consciousness: awake and alert Pain management: pain level controlled Vital Signs Assessment: post-procedure vital signs reviewed and stable Respiratory status: spontaneous breathing, nonlabored ventilation, respiratory function stable and patient connected to nasal cannula oxygen Cardiovascular status: blood pressure returned to baseline and stable Postop Assessment: no apparent nausea or vomiting Anesthetic complications: no  No notable events documented.   Last Vitals:  Vitals:   10/28/22 1023 10/28/22 1033  BP:  129/77  Pulse: 63 60  Resp: 18 18  Temp:    SpO2: 95% 99%    Last Pain:  Vitals:   10/28/22 1033  TempSrc:   PainSc: 0-No pain                 Stephanie Coup

## 2022-10-28 NOTE — H&P (Signed)
[name], MD [address], [place], [place], [address] [address], [address], [place], [place], [address] Phone: [phone]  Fax: [phone]  Primary Care Physician:  Pcp, No   Pre-Procedure History & Physical: HPI:  Matthew [name] is a 62 y.o. male is here for an colonoscopy.   Past Medical History:  Diagnosis Date   Hypertension    Pneumothorax 12/15/2014   Substance abuse (HCC)    alcohol    Past Surgical History:  Procedure Laterality Date   APPENDECTOMY      Prior to Admission medications   Medication Sig Start Date End Date Taking? Authorizing Provider  amLODipine (NORVASC) 10 MG tablet Take 1 tablet (10 mg total) by mouth daily. 10/13/22  Yes Sonny-Echendu, Ijeoma A, FNP  escitalopram (LEXAPRO) 10 MG tablet Take 1 tablet (10 mg total) by mouth daily. 10/19/22  Yes   hydrOXYzine (ATARAX) 25 MG tablet Take 1-2 tablets (25-50 mg total) by mouth at bedtime as needed. 10/19/22  Yes   hydrOXYzine (ATARAX) 50 MG tablet Take 1 tablet (50 mg total) by mouth every 6 (six) hours as needed for anxiety. Patient not taking: Reported on 10/13/2022 07/27/22   Clapacs, Matthew Denmark, MD  traZODone (DESYREL) 100 MG tablet Take 1 tablet (100 mg total) by mouth at bedtime as needed for sleep. Patient not taking: Reported on 10/13/2022 07/27/22   Clapacs, Matthew Denmark, MD    Allergies as of 10/18/2022   (No Known Allergies)    Family History  Problem Relation Age of Onset   Hypertension Mother    Other Father        unknown medical history   Diabetes Maternal Grandmother    Other Maternal Grandfather        unknown medical history   Other Paternal Grandmother        unknown medical history   Other Paternal Grandfather        unknown medical history    Social History   Socioeconomic History   Marital status: Divorced    Spouse name: Not on file   Number of children: Not on file   Years of education: Not on file   Highest education level: Not on file  Occupational  History   Not on file  Tobacco Use   Smoking status: Every Day    Packs/day: 0.50    Years: 35.00    Additional pack years: 0.00    Total pack years: 17.50    Types: Cigarettes   Smokeless tobacco: Never  Vaping Use   Vaping Use: Never used  Substance and Sexual Activity   Alcohol use: Not Currently    Comment: 80 oz beer daily, 1/2 pint liquor daily, last use 07/19/22   Drug use: Not Currently    Types: Marijuana, "Crack" cocaine    Comment: last use mid 1990s   Sexual activity: Not Currently    Birth control/protection: None  Other Topics Concern   Not on file  Social History Narrative   Not on file   Social Determinants of Health   Financial Resource Strain: Not on file  Food Insecurity: No Food Insecurity (10/13/2022)   Hunger Vital Sign    Worried About Running Out of Food in the Last Year: Never true    Ran Out of Food in the Last Year: Never true  Transportation Needs: No Transportation Needs (10/13/2022)   PRAPARE - Transportation    Lack of Transportation (Medical): No    Lack  of Transportation (Non-Medical): No  Physical Activity: Not on file  Stress: Not on file  Social Connections: Not on file  Intimate Partner Violence: Not At Risk (07/20/2022)   Humiliation, Afraid, Rape, and Kick questionnaire    Fear of Current or Ex-Partner: No    Emotionally Abused: No    Physically Abused: No    Sexually Abused: No    Review of Systems: See HPI, otherwise negative ROS  Physical Exam: BP 121/75   Pulse 67   Temp (!) 96.9 F (36.1 C) (Temporal)   Resp 20   Ht 5\' 11"  (1.803 m)   Wt 85.8 kg   SpO2 100%   BMI 26.39 kg/m  General:   Alert,  pleasant and cooperative in NAD Head:  Normocephalic and atraumatic. Neck:  Supple; no masses or thyromegaly. Lungs:  Clear throughout to auscultation, normal respiratory effort.    Heart:  +S1, +S2, Regular rate and rhythm, No edema. Abdomen:  Soft, nontender and nondistended. Normal bowel sounds, without guarding, and  without rebound.   Neurologic:  Alert and  oriented x4;  grossly normal neurologically.  Impression/Plan: BOL MASIELLO is here for an colonoscopy to be performed for Screening colonoscopy average risk   Risks, benefits, limitations, and alternatives regarding  colonoscopy have been reviewed with the patient.  Questions have been answered.  All parties agreeable.   [name], MD  10/28/2022, 9:47 AM

## 2022-10-28 NOTE — Transfer of Care (Addendum)
Immediate Anesthesia Transfer of Care Note  Patient: Matthew [name]  Procedure(s) Performed: COLONOSCOPY WITH PROPOFOL  Patient Location: PACU and Endoscopy Unit  Anesthesia Type:General  Level of Consciousness: awake, drowsy, and patient cooperative  Airway & Oxygen Therapy: Patient Spontanous Breathing  Post-op Assessment: Report given to RN and Post -op Vital signs reviewed and stable  Post vital signs: Reviewed and stable  Last Vitals:  Vitals Value Taken Time  BP    Temp    Pulse    Resp    SpO2      Last Pain:  Vitals:   10/28/22 0910  TempSrc: Temporal  PainSc: 0-No pain         Complications: No notable events documented.

## 2022-10-28 NOTE — Op Note (Signed)
[facility] Gastroenterology Patient Name: Matthew [name] Procedure Date: 10/28/2022 9:42 AM MRN: [id number] Account #: [id number] Date of Birth: 1961/05/06 Admit Type: Outpatient Age: 62 Room: [facility] ENDO ROOM 4 Gender: Male Note Status: Finalized Instrument Name: Nelda Marseille [id number] Procedure:             Colonoscopy Indications:           Screening for colorectal malignant neoplasm Providers:             [name] MD, MD Referring MD:          No Local Md, MD (Referring MD) Medicines:             Monitored Anesthesia Care Complications:         No immediate complications. Procedure:             Pre-Anesthesia Assessment:                        - Prior to the procedure, a History and Physical was                         performed, and patient medications, allergies and                         sensitivities were reviewed. The patient's tolerance                         of previous anesthesia was reviewed.                        - The risks and benefits of the procedure and the                         sedation options and risks were discussed with the                         patient. All questions were answered and informed                         consent was obtained.                        - ASA Grade Assessment: II - A patient with mild                         systemic disease.                        After obtaining informed consent, the colonoscope was                         passed under direct vision. Throughout the procedure,                         the patient's blood pressure, pulse, and oxygen                         saturations were monitored continuously. The                         Colonoscope was  introduced through the anus and                         advanced to the the cecum, identified by the                         appendiceal orifice. The colonoscopy was performed                         with ease. The patient tolerated the procedure well.                          The quality of the bowel preparation was excellent.                         The ileocecal valve, appendiceal orifice, and rectum                         were photographed. Findings:      The perianal and digital rectal examinations were normal.      Multiple small-mouthed diverticula were found in the right colon.      A 15 mm polyp was found in the ascending colon. The polyp was       semi-pedunculated. The polyp was removed with a hot snare. Resection and       retrieval were complete.      A 7 mm polyp was found in the transverse colon. The polyp was sessile.       The polyp was removed with a cold snare. Resection and retrieval were       complete.      The exam was otherwise without abnormality on direct and retroflexion       views. Impression:            - Diverticulosis in the right colon.                        - One 15 mm polyp in the ascending colon, removed with                         a hot snare. Resected and retrieved.                        - One 7 mm polyp in the transverse colon, removed with                         a cold snare. Resected and retrieved.                        - The examination was otherwise normal on direct and                         retroflexion views. Recommendation:        - Discharge patient to home (with escort).                        - Resume previous diet.                        - Continue present medications.                        -  Await pathology results.                        - Repeat colonoscopy in 3 years for surveillance based                         on pathology results. Procedure Code(s):     --- Professional ---                        [phone], Colonoscopy, flexible; with removal of                         tumor(s), polyp(s), or other lesion(s) by snare                         technique Diagnosis Code(s):     --- Professional ---                        Z12.11, Encounter for screening for malignant neoplasm                          of colon                        D12.2, Benign neoplasm of ascending colon                        D12.3, Benign neoplasm of transverse colon (hepatic                         flexure or splenic flexure)                        K57.30, Diverticulosis of large intestine without                         perforation or abscess without bleeding CPT copyright 2022 American Medical Association. All rights reserved. The codes documented in this report are preliminary and upon coder review may  be revised to meet current compliance requirements. [name], MD [name] MD, MD 10/28/2022 10:11:52 AM This report has been signed electronically. Number of Addenda: 0 Note Initiated On: 10/28/2022 9:42 AM Scope Withdrawal Time: 0 hours 13 minutes 3 seconds  Total Procedure Duration: 0 hours 18 minutes 6 seconds  Estimated Blood Loss:  Estimated blood loss: none.      [facility]

## 2022-10-31 ENCOUNTER — Encounter: Payer: Self-pay | Admitting: Gastroenterology

## 2022-11-09 ENCOUNTER — Ambulatory Visit: Payer: Self-pay | Admitting: Gerontology

## 2022-11-09 ENCOUNTER — Encounter: Payer: Self-pay | Admitting: Gerontology

## 2022-11-09 ENCOUNTER — Other Ambulatory Visit: Payer: Self-pay

## 2022-11-09 VITALS — BP 106/68 | HR 71 | Temp 98.2°F | Resp 16 | Ht 70.5 in | Wt 193.7 lb

## 2022-11-09 DIAGNOSIS — E785 Hyperlipidemia, unspecified: Secondary | ICD-10-CM

## 2022-11-09 DIAGNOSIS — I10 Essential (primary) hypertension: Secondary | ICD-10-CM

## 2022-11-09 DIAGNOSIS — Z7689 Persons encountering health services in other specified circumstances: Secondary | ICD-10-CM

## 2022-11-09 LAB — POCT GLYCOSYLATED HEMOGLOBIN (HGB A1C)

## 2022-11-09 MED ORDER — AMLODIPINE BESYLATE 10 MG PO TABS
10.0000 mg | ORAL_TABLET | Freq: Every day | ORAL | 1 refills | Status: DC
Start: 2022-11-09 — End: 2023-12-30
  Filled 2022-11-09: qty 90, 90d supply, fill #0
  Filled 2023-03-23: qty 90, 90d supply, fill #1

## 2022-11-09 NOTE — Progress Notes (Signed)
Established Patient Office Visit  Subjective   Patient ID: Matthew [name], male    DOB: 1960-09-19  Age: 62 y.o. MRN: [id number]  Chief Complaint  Patient presents with   Follow-up    Labs drawn 10/13/22    HPI  Matthew [name] is 62 year old male who has history of hypertension, pneumothorax, anxiety, and depression who presents for follow up visit and lab review. He is compliant with his medications, denies side effects and continues to make healthy lifestyle changes. His lab done on 10/13/22 was unremarkable except LDL that was 120 mg/dl. He had Colonoscopy done on 10/28/22 and per Dr Jonetta Osgood note reported that 2 polyps was removed and result is pending. Overall, he states that he's doing well and offers no further complaint.  Review of Systems  Constitutional: Negative.   Eyes: Negative.   Respiratory: Negative.    Cardiovascular: Negative.   Neurological: Negative.   Psychiatric/Behavioral: Negative.        Objective:     BP 106/68 (BP Location: Left Arm, Patient Position: Sitting, Cuff Size: Large)   Pulse 71   Temp 98.2 F (36.8 C) (Oral)   Resp 16   Ht 5' 10.5" (1.791 m)   Wt 193 lb 11.2 oz (87.9 kg)   SpO2 97%   BMI 27.40 kg/m  BP Readings from Last 3 Encounters:  11/09/22 106/68  10/28/22 129/77  10/13/22 119/75   Wt Readings from Last 3 Encounters:  11/09/22 193 lb 11.2 oz (87.9 kg)  10/28/22 189 lb 3.2 oz (85.8 kg)  10/13/22 190 lb 3.2 oz (86.3 kg)      Physical Exam HENT:     Head: Normocephalic and atraumatic.  Cardiovascular:     Rate and Rhythm: Normal rate and regular rhythm.     Pulses: Normal pulses.     Heart sounds: Normal heart sounds.  Pulmonary:     Effort: Pulmonary effort is normal.     Breath sounds: Normal breath sounds.  Skin:    General: Skin is warm.  Neurological:     General: No focal deficit present.     Mental Status: He is alert and oriented to person, place, and time. Mental status is at baseline.  Psychiatric:         Mood and Affect: Mood normal.        Behavior: Behavior normal.        Thought Content: Thought content normal.        Judgment: Judgment normal.      Results for orders placed or performed in visit on 11/09/22  POCT HgB A1C  Result Value Ref Range   Hemoglobin A1C     HbA1c POC (<> result, manual entry)     HbA1c, POC (prediabetic range)     HbA1c, POC (controlled diabetic range)      Last CBC Lab Results  Component Value Date   WBC 5.7 10/13/2022   HGB 13.3 10/13/2022   HCT 39.6 10/13/2022   MCV 92 10/13/2022   MCH 30.7 10/13/2022   RDW 11.2 (L) 10/13/2022   PLT 252 10/13/2022   Last metabolic panel Lab Results  Component Value Date   GLUCOSE 85 10/13/2022   NA 139 10/13/2022   K 4.5 10/13/2022   CL 103 10/13/2022   CO2 24 10/13/2022   BUN 10 10/13/2022   CREATININE 0.82 10/13/2022   EGFR 101 10/13/2022   CALCIUM 9.8 10/13/2022   PROT 7.3 10/13/2022   ALBUMIN 4.6  10/13/2022   LABGLOB 2.7 10/13/2022   AGRATIO 1.7 10/13/2022   BILITOT 1.3 (H) 10/13/2022   ALKPHOS 133 (H) 10/13/2022   AST 20 10/13/2022   ALT 15 10/13/2022   ANIONGAP 8 07/20/2022   Last lipids Lab Results  Component Value Date   CHOL 193 10/13/2022   HDL 60 10/13/2022   LDLCALC 120 (H) 10/13/2022   TRIG 72 10/13/2022   CHOLHDL 3.2 10/13/2022   Last hemoglobin A1c Lab Results  Component Value Date   HGBA1C 4.4 12/22/2014   Last thyroid functions Lab Results  Component Value Date   TSH 1.603 10/25/2014   Last vitamin D No results found for: "25OHVITD2", "25OHVITD3", "VD25OH"    The 10-year ASCVD risk score (Arnett DK, et al., 2019) is: 14.4%    Assessment & Plan:   1. HTN (hypertension), benign - His blood pressure, is improving, he will continue on current medication, DASH diet and exercise as tolerated. - amLODipine (NORVASC) 10 MG tablet; Take 1 tablet (10 mg total) by mouth daily.  Dispense: 90 tablet; Refill: 1  3. Elevated lipids - The 10-year ASCVD risk score  (Arnett DK, et al., 2019) is: 14.4%   Values used to calculate the score:     Age: 41 years     Sex: Male     Is Non-Hispanic African American: Yes     Diabetic: No     Tobacco smoker: Yes     Systolic Blood Pressure: 106 mmHg     Is BP treated: Yes     HDL Cholesterol: 60 mg/dL     Total Cholesterol: 193 mg/dL His ASCVD risk was 28.4%, he states that he will modify his diet and will recheck Lipid in 2 months. He was advised to continue on low fat/cholesterol diet and exercise as tolerated. - Lipid panel; Future    Return in about 2 months (around 01/12/2023), or if symptoms worsen or fail to improve.    Matthew Eisenmenger Trellis Paganini, NP

## 2022-11-09 NOTE — Patient Instructions (Signed)
Dyslipidemia Dyslipidemia is an imbalance of waxy, fat-like substances (lipids) in the blood. The body needs lipids in small amounts. Dyslipidemia often involves a high level of cholesterol or triglycerides, which are types of lipids. Common forms of dyslipidemia include: High levels of LDL cholesterol. LDL is the type of cholesterol that causes fatty deposits (plaques) to build up in the blood vessels that carry blood away from the heart (arteries). Low levels of HDL cholesterol. HDL cholesterol is the type of cholesterol that protects against heart disease. High levels of HDL remove the LDL buildup from arteries. High levels of triglycerides. Triglycerides are a fatty substance in the blood that is linked to a buildup of plaques in the arteries. What are the causes? There are two main types of dyslipidemia: primary and secondary. Primary dyslipidemia is caused by changes (mutations) in genes that are passed down through families (inherited). These mutations cause several types of dyslipidemia. Secondary dyslipidemia may be caused by various risk factors that can lead to the disease, such as lifestyle choices and certain medical conditions. What increases the risk? You are more likely to develop this condition if you are an older man or if you are a woman who has gone through menopause. Other risk factors include: Having a family history of dyslipidemia. Taking certain medicines, including birth control pills, steroids, some diuretics, and beta-blockers. Eating a diet high in saturated fat. Smoking cigarettes or excessive alcohol intake. Having certain medical conditions such as diabetes, polycystic ovary syndrome (PCOS), kidney disease, liver disease, or hypothyroidism. Not exercising regularly. Being overweight or obese with too much belly fat. What are the signs or symptoms? In most cases, dyslipidemia does not usually cause any symptoms. In severe cases, very high lipid levels can  cause: Fatty bumps under the skin (xanthomas). A white or gray ring around the black center (pupil) of the eye. Very high triglyceride levels can cause inflammation of the pancreas (pancreatitis). How is this diagnosed? Your health care provider may diagnose dyslipidemia based on a routine blood test (fasting blood test). Because most people do not have symptoms of the condition, this blood testing (lipid profile) is done on adults age 20 and older and is repeated every 4-6 years. This test checks: Total cholesterol. This measures the total amount of cholesterol in your blood, including LDL cholesterol, HDL cholesterol, and triglycerides. A healthy number is below 200 mg/dL (5.17 mmol/L). LDL cholesterol. The target number for LDL cholesterol is different for each person, depending on individual risk factors. A healthy number is usually below 100 mg/dL (2.59 mmol/L). Ask your health care provider what your LDL cholesterol should be. HDL cholesterol. An HDL level of 60 mg/dL (1.55 mmol/L) or higher is best because it helps to protect against heart disease. A number below 40 mg/dL (1.03 mmol/L) for men or below 50 mg/dL (1.29 mmol/L) for women increases the risk for heart disease. Triglycerides. A healthy triglyceride number is below 150 mg/dL (1.69 mmol/L). If your lipid profile is abnormal, your health care provider may do other blood tests. How is this treated? Treatment depends on the type of dyslipidemia that you have and your other risk factors for heart disease and stroke. Your health care provider will have a target range for your lipid levels based on this information. Treatment for dyslipidemia starts with lifestyle changes, such as diet and exercise. Your health care provider may recommend that you: Get regular exercise. Make changes to your diet. Quit smoking if you smoke. Limit your alcohol intake. If diet   changes and exercise do not help you reach your goals, your health care provider  may also prescribe medicine to lower lipids. The most commonly prescribed type of medicine lowers your LDL cholesterol (statin drug). If you have a high triglyceride level, your provider may prescribe another type of drug (fibrate) or an omega-3 fish oil supplement, or both. Follow these instructions at home: Eating and drinking  Follow instructions from your health care provider or dietitian about eating or drinking restrictions. Eat a healthy diet as told by your health care provider. This can help you reach and maintain a healthy weight, lower your LDL cholesterol, and raise your HDL cholesterol. This may include: Limiting your calories, if you are overweight. Eating more fruits, vegetables, whole grains, fish, and lean meats. Limiting saturated fat, trans fat, and cholesterol. Do not drink alcohol if: Your health care provider tells you not to drink. You are pregnant, may be pregnant, or are planning to become pregnant. If you drink alcohol: Limit how much you have to: 0-1 drink a day for women. 0-2 drinks a day for men. Know how much alcohol is in your drink. In the [place] one drink equals one 12 oz bottle of beer (355 mL), one 5 oz glass of wine (148 mL), or one 1 oz glass of hard liquor (44 mL). Activity Get regular exercise. Start an exercise and strength training program as told by your health care provider. Ask your health care provider what activities are safe for you. Your health care provider may recommend: 30 minutes of aerobic activity 4-6 days a week. Brisk walking is an example of aerobic activity. Strength training 2 days a week. General instructions Do not use any products that contain nicotine or tobacco. These products include cigarettes, chewing tobacco, and vaping devices, such as e-cigarettes. If you need help quitting, ask your health care provider. Take over-the-counter and prescription medicines only as told by your health care provider. This includes  supplements. Keep all follow-up visits. This is important. Contact a health care provider if: You are having trouble sticking to your exercise or diet plan. You are struggling to quit smoking or to control your use of alcohol. Summary Dyslipidemia often involves a high level of cholesterol or triglycerides, which are types of lipids. Treatment depends on the type of dyslipidemia that you have and your other risk factors for heart disease and stroke. Treatment for dyslipidemia starts with lifestyle changes, such as diet and exercise. Your health care provider may prescribe medicine to lower lipids. This information is not intended to replace advice given to you by your health care provider. Make sure you discuss any questions you have with your health care provider. Document Revised: 12/03/2021 Document Reviewed: 07/06/2020 Elsevier Patient Education  2024 [employer]  

## 2022-11-10 ENCOUNTER — Other Ambulatory Visit: Payer: Self-pay

## 2022-12-13 ENCOUNTER — Other Ambulatory Visit: Payer: Self-pay

## 2023-01-04 ENCOUNTER — Other Ambulatory Visit: Payer: Self-pay

## 2023-01-04 DIAGNOSIS — E785 Hyperlipidemia, unspecified: Secondary | ICD-10-CM

## 2023-01-06 LAB — LIPID PANEL
Chol/HDL Ratio: 3.8 ratio (ref 0.0–5.0)
Cholesterol, Total: 171 mg/dL (ref 100–199)
HDL: 45 mg/dL (ref >39)
LDL Chol Calc (NIH): 116 mg/dL — ABNORMAL HIGH (ref 0–99)
Triglycerides: 49 mg/dL (ref 0–149)
VLDL Cholesterol Cal: 10 mg/dL (ref 5–40)

## 2023-01-11 ENCOUNTER — Other Ambulatory Visit: Payer: Self-pay

## 2023-01-11 ENCOUNTER — Encounter: Payer: Self-pay | Admitting: Gerontology

## 2023-01-11 ENCOUNTER — Ambulatory Visit: Payer: Self-pay | Admitting: Gerontology

## 2023-01-11 VITALS — BP 138/79 | HR 56 | Ht 71.0 in | Wt 194.7 lb

## 2023-01-11 DIAGNOSIS — E785 Hyperlipidemia, unspecified: Secondary | ICD-10-CM

## 2023-01-11 DIAGNOSIS — I10 Essential (primary) hypertension: Secondary | ICD-10-CM

## 2023-01-11 DIAGNOSIS — F17200 Nicotine dependence, unspecified, uncomplicated: Secondary | ICD-10-CM

## 2023-01-11 MED ORDER — ROSUVASTATIN CALCIUM 5 MG PO TABS
5.0000 mg | ORAL_TABLET | Freq: Every day | ORAL | 0 refills | Status: DC
Start: 2023-01-11 — End: 2023-02-09
  Filled 2023-01-11: qty 30, 30d supply, fill #0

## 2023-01-11 NOTE — Patient Instructions (Signed)
Heart-Healthy Eating Plan Many factors influence your heart health, including eating and exercise habits. Heart health is also called coronary health. Coronary risk increases with abnormal blood fat (lipid) levels. A heart-healthy eating plan includes limiting unhealthy fats, increasing healthy fats, limiting salt (sodium) intake, and making other diet and lifestyle changes. What is my plan? Your health care provider may recommend that: You limit your fat intake to _________% or less of your total calories each day. You limit your saturated fat intake to _________% or less of your total calories each day. You limit the amount of cholesterol in your diet to less than _________ mg per day. You limit the amount of sodium in your diet to less than _________ mg per day. What are tips for following this plan? Cooking Cook foods using methods other than frying. Baking, boiling, grilling, and broiling are all good options. Other ways to reduce fat include: Removing the skin from poultry. Removing all visible fats from meats. Steaming vegetables in water or broth. Meal planning  At meals, imagine dividing your plate into fourths: Fill one-half of your plate with vegetables and green salads. Fill one-fourth of your plate with whole grains. Fill one-fourth of your plate with lean protein foods. Eat 2-4 cups of vegetables per day. One cup of vegetables equals 1 cup (91 g) broccoli or cauliflower florets, 2 medium carrots, 1 large bell pepper, 1 large sweet potato, 1 large tomato, 1 medium white potato, 2 cups (150 g) raw leafy greens. Eat 1-2 cups of fruit per day. One cup of fruit equals 1 small apple, 1 large banana, 1 cup (237 g) mixed fruit, 1 large orange,  cup (82 g) dried fruit, 1 cup (240 mL) 100% fruit juice. Eat more foods that contain soluble fiber. Examples include apples, broccoli, carrots, beans, peas, and barley. Aim to get 25-30 g of fiber per day. Increase your consumption of legumes,  nuts, and seeds to 4-5 servings per week. One serving of dried beans or legumes equals  cup (90 g) cooked, 1 serving of nuts is  oz (12 almonds, 24 pistachios, or 7 walnut halves), and 1 serving of seeds equals  oz (8 g). Fats Choose healthy fats more often. Choose monounsaturated and polyunsaturated fats, such as olive and canola oils, avocado oil, flaxseeds, walnuts, almonds, and seeds. Eat more omega-3 fats. Choose salmon, mackerel, sardines, tuna, flaxseed oil, and ground flaxseeds. Aim to eat fish at least 2 times each week. Check food labels carefully to identify foods with trans fats or high amounts of saturated fat. Limit saturated fats. These are found in animal products, such as meats, butter, and cream. Plant sources of saturated fats include palm oil, palm kernel oil, and coconut oil. Avoid foods with partially hydrogenated oils in them. These contain trans fats. Examples are stick margarine, some tub margarines, cookies, crackers, and other baked goods. Avoid fried foods. General information Eat more home-cooked food and less restaurant, buffet, and fast food. Limit or avoid alcohol. Limit foods that are high in added sugar and simple starches such as foods made using white refined flour (white breads, pastries, sweets). Lose weight if you are overweight. Losing just 5-10% of your body weight can help your overall health and prevent diseases such as diabetes and heart disease. Monitor your sodium intake, especially if you have high blood pressure. Talk with your health care provider about your sodium intake. Try to incorporate more vegetarian meals weekly. What foods should I eat? Fruits All fresh, canned (in natural   juice), or frozen fruits. Vegetables Fresh or frozen vegetables (raw, steamed, roasted, or grilled). Green salads. Grains Most grains. Choose whole wheat and whole grains most of the time. Rice and pasta, including brown rice and pastas made with whole wheat. Meats  and other proteins Lean, well-trimmed beef, veal, pork, and lamb. Chicken and [place] without skin. All fish and shellfish. Wild duck, rabbit, pheasant, and venison. Egg whites or low-cholesterol egg substitutes. Dried beans, peas, lentils, and tofu. Seeds and most nuts. Dairy Low-fat or nonfat cheeses, including ricotta and mozzarella. Skim or 1% milk (liquid, powdered, or evaporated). Buttermilk made with low-fat milk. Nonfat or low-fat yogurt. Fats and oils Non-hydrogenated (trans-free) margarines. Vegetable oils, including soybean, sesame, sunflower, olive, avocado, peanut, safflower, corn, canola, and cottonseed. Salad dressings or mayonnaise made with a vegetable oil. Beverages Water (mineral or sparkling). Coffee and tea. Unsweetened ice tea. Diet beverages. Sweets and desserts Sherbet, gelatin, and fruit ice. Small amounts of dark chocolate. Limit all sweets and desserts. Seasonings and condiments All seasonings and condiments. The items listed above may not be a complete list of foods and beverages you can eat. Contact a dietitian for more options. What foods should I avoid? Fruits Canned fruit in heavy syrup. Fruit in cream or butter sauce. Fried fruit. Limit coconut. Vegetables Vegetables cooked in cheese, cream, or butter sauce. Fried vegetables. Grains Breads made with saturated or trans fats, oils, or whole milk. Croissants. Sweet rolls. Donuts. High-fat crackers, such as cheese crackers and chips. Meats and other proteins Fatty meats, such as hot dogs, ribs, sausage, bacon, rib-eye roast or steak. High-fat deli meats, such as salami and bologna. Caviar. Domestic duck and goose. Organ meats, such as liver. Dairy Cream, sour cream, cream cheese, and creamed cottage cheese. Whole-milk cheeses. Whole or 2% milk (liquid, evaporated, or condensed). Whole buttermilk. Cream sauce or high-fat cheese sauce. Whole-milk yogurt. Fats and oils Meat fat, or shortening. Cocoa butter,  hydrogenated oils, palm oil, coconut oil, palm kernel oil. Solid fats and shortenings, including bacon fat, salt pork, lard, and butter. Nondairy cream substitutes. Salad dressings with cheese or sour cream. Beverages Regular sodas and any drinks with added sugar. Sweets and desserts Frosting. Pudding. Cookies. Cakes. Pies. Milk chocolate or white chocolate. Buttered syrups. Full-fat ice cream or ice cream drinks. The items listed above may not be a complete list of foods and beverages to avoid. Contact a dietitian for more information. Summary Heart-healthy meal planning includes limiting unhealthy fats, increasing healthy fats, limiting salt (sodium) intake and making other diet and lifestyle changes. Lose weight if you are overweight. Losing just 5-10% of your body weight can help your overall health and prevent diseases such as diabetes and heart disease. Focus on eating a balance of foods, including fruits and vegetables, low-fat or nonfat dairy, lean protein, nuts and legumes, whole grains, and heart-healthy oils and fats. This information is not intended to replace advice given to you by your health care provider. Make sure you discuss any questions you have with your health care provider. Document Revised: 06/07/2021 Document Reviewed: 06/07/2021 Elsevier Patient Education  2024 Else[employer] Inc. DASH Eating Plan DASH stands for Dietary Approaches to Stop Hypertension. The DASH eating plan is a healthy eating plan that has been shown to: Lower high blood pressure (hypertension). Reduce your risk for type 2 diabetes, heart disease, and stroke. Help with weight loss. What are tips for following this plan? Reading food labels Check food labels for the amount of salt (sodium) per serving. Choose  foods with less than 5 percent of the Daily Value (DV) of sodium. In general, foods with less than 300 milligrams (mg) of sodium per serving fit into this eating plan. To find whole grains, look for the  word "whole" as the first word in the ingredient list. Shopping Buy products labeled as "low-sodium" or "no salt added." Buy fresh foods. Avoid canned foods and pre-made or frozen meals. Cooking Try not to add salt when you cook. Use salt-free seasonings or herbs instead of table salt or sea salt. Check with your health care provider or pharmacist before using salt substitutes. Do not fry foods. Cook foods in healthy ways, such as baking, boiling, grilling, roasting, or broiling. Cook using oils that are good for your heart. These include olive, canola, avocado, soybean, and sunflower oil. Meal planning  Eat a balanced diet. This should include: 4 or more servings of fruits and 4 or more servings of vegetables each day. Try to fill half of your plate with fruits and vegetables. 6-8 servings of whole grains each day. 6 or less servings of lean meat, poultry, or fish each day. 1 oz is 1 serving. A 3 oz (85 g) serving of meat is about the same size as the palm of your hand. One egg is 1 oz (28 g). 2-3 servings of low-fat dairy each day. One serving is 1 cup (237 mL). 1 serving of nuts, seeds, or beans 5 times each week. 2-3 servings of heart-healthy fats. Healthy fats called omega-3 fatty acids are found in foods such as walnuts, flaxseeds, fortified milks, and eggs. These fats are also found in cold-water fish, such as sardines, salmon, and mackerel. Limit how much you eat of: Canned or prepackaged foods. Food that is high in trans fat, such as fried foods. Food that is high in saturated fat, such as fatty meat. Desserts and other sweets, sugary drinks, and other foods with added sugar. Full-fat dairy products. Do not salt foods before eating. Do not eat more than 4 egg yolks a week. Try to eat at least 2 vegetarian meals a week. Eat more home-cooked food and less restaurant, buffet, and fast food. Lifestyle When eating at a restaurant, ask if your food can be made with less salt or no  salt. If you drink alcohol: Limit how much you have to: 0-1 drink a day if you are male. 0-2 drinks a day if you are male. Know how much alcohol is in your drink. In the [place] one drink is one 12 oz bottle of beer (355 mL), one 5 oz glass of wine (148 mL), or one 1 oz glass of hard liquor (44 mL). General information Avoid eating more than 2,300 mg of salt a day. If you have hypertension, you may need to reduce your sodium intake to 1,500 mg a day. Work with your provider to stay at a healthy body weight or lose weight. Ask what the best weight range is for you. On most days of the week, get at least 30 minutes of exercise that causes your heart to beat faster. This may include walking, swimming, or biking. Work with your provider or dietitian to adjust your eating plan to meet your specific calorie needs. What foods should I eat? Fruits All fresh, dried, or frozen fruit. Canned fruits that are in their natural juice and do not have sugar added to them. Vegetables Fresh or frozen vegetables that are raw, steamed, roasted, or grilled. Low-sodium or reduced-sodium tomato and vegetable juice. Low-sodium  or reduced-sodium tomato sauce and tomato paste. Low-sodium or reduced-sodium canned vegetables. Grains Whole-grain or whole-wheat bread. Whole-grain or whole-wheat pasta. Brown rice. Orpah Cobb. Bulgur. Whole-grain and low-sodium cereals. Pita bread. Low-fat, low-sodium crackers. Whole-wheat flour tortillas. Meats and other proteins Skinless chicken or [place]. Ground chicken or [place]. Pork with fat trimmed off. Fish and seafood. Egg whites. Dried beans, peas, or lentils. Unsalted nuts, nut butters, and seeds. Unsalted canned beans. Lean cuts of beef with fat trimmed off. Low-sodium, lean precooked or cured meat, such as sausages or meat loaves. Dairy Low-fat (1%) or fat-free (skim) milk. Reduced-fat, low-fat, or fat-free cheeses. Nonfat, low-sodium ricotta or cottage cheese. Low-fat or  nonfat yogurt. Low-fat, low-sodium cheese. Fats and oils Soft margarine without trans fats. Vegetable oil. Reduced-fat, low-fat, or light mayonnaise and salad dressings (reduced-sodium). Canola, safflower, olive, avocado, soybean, and sunflower oils. Avocado. Seasonings and condiments Herbs. Spices. Seasoning mixes without salt. Other foods Unsalted popcorn and pretzels. Fat-free sweets. The items listed above may not be all the foods and drinks you can have. Talk to a dietitian to learn more. What foods should I avoid? Fruits Canned fruit in a light or heavy syrup. Fried fruit. Fruit in cream or butter sauce. Vegetables Creamed or fried vegetables. Vegetables in a cheese sauce. Regular canned vegetables that are not marked as low-sodium or reduced-sodium. Regular canned tomato sauce and paste that are not marked as low-sodium or reduced-sodium. Regular tomato and vegetable juices that are not marked as low-sodium or reduced-sodium. Rosita Fire. Olives. Grains Baked goods made with fat, such as croissants, muffins, or some breads. Dry pasta or rice meal packs. Meats and other proteins Fatty cuts of meat. Ribs. Fried meat. Tomasa Blase. Bologna, salami, and other precooked or cured meats, such as sausages or meat loaves, that are not lean and low in sodium. Fat from the back of a pig (fatback). Bratwurst. Salted nuts and seeds. Canned beans with added salt. Canned or smoked fish. Whole eggs or egg yolks. Chicken or [place] with skin. Dairy Whole or 2% milk, cream, and half-and-half. Whole or full-fat cream cheese. Whole-fat or sweetened yogurt. Full-fat cheese. Nondairy creamers. Whipped toppings. Processed cheese and cheese spreads. Fats and oils Butter. Stick margarine. Lard. Shortening. Ghee. Bacon fat. Tropical oils, such as coconut, palm kernel, or palm oil. Seasonings and condiments Onion salt, garlic salt, seasoned salt, table salt, and sea salt. Worcestershire sauce. Tartar sauce. Barbecue sauce.  Teriyaki sauce. Soy sauce, including reduced-sodium soy sauce. Steak sauce. Canned and packaged gravies. Fish sauce. Oyster sauce. Cocktail sauce. Store-bought horseradish. Ketchup. Mustard. Meat flavorings and tenderizers. Bouillon cubes. Hot sauces. Pre-made or packaged marinades. Pre-made or packaged taco seasonings. Relishes. Regular salad dressings. Other foods Salted popcorn and pretzels. The items listed above may not be all the foods and drinks you should avoid. Talk to a dietitian to learn more. Where to find more information National Heart, Lung, and Blood Institute (NHLBI): BuffaloDryCleaner.gl American Heart Association (AHA): heart.org Academy of Nutrition and Dietetics: eatright.org National Kidney Foundation (NKF): kidney.org This information is not intended to replace advice given to you by your health care provider. Make sure you discuss any questions you have with your health care provider. Document Revised: 05/19/2022 Document Reviewed: 05/19/2022 Elsevier Patient Education  2024 [employer].

## 2023-01-11 NOTE — Progress Notes (Signed)
Established Patient Office Visit  Subjective   Patient ID: Matthew [name], male    DOB: 1960-08-13  Age: 61 y.o. MRN: [id number]  Chief Complaint  Patient presents with   Follow-up    HPI Matthew [name] is 62 year old male who has history of hypertension, pneumothorax, anxiety, and depression who presents for follow up visit and lab review. He states that he stopped taking his Hydroxyzine 25 mg because he doesn't need it any more. Otherwise, he is compliant with his other medications, denies side effects and continues to make healthy lifestyle changes. His lab done on 01/04/23 was unremarkable except LDL that was 116 mg/dl. He states that he smokes half pack  of cigarette daily and he is not willing to stop smoking. He denies chest pain, palpitation, light headedness and vision changes. Overall, he states that he's doing well and offers no further complaint.       Patient Active Problem List   Diagnosis Date Noted   Elevated lipids 11/09/2022   Adenomatous polyp of colon 10/28/2022   Screen for colon cancer 10/13/2022   MDD (major depressive disorder) 07/20/2022   Seizure (HCC) 07/28/2021   Macrocytic anemia 07/28/2021   Aftercare 02/11/2015   Pain and swelling of right elbow 01/07/2015   Pulmonary infiltrate    Contusion of right shoulder    Eczema 10/30/2014   Alcohol use disorder, severe, dependence (HCC) 10/24/2014   Tobacco use disorder 10/24/2014   HTN (hypertension), benign 10/24/2014   Major depressive disorder, single episode, severe without psychotic features (HCC) 10/24/2014   Past Medical History:  Diagnosis Date   Hypertension    Pneumothorax 12/15/2014   Substance abuse (HCC)    alcohol   Past Surgical History:  Procedure Laterality Date   APPENDECTOMY     COLONOSCOPY WITH PROPOFOL N/A 10/28/2022   Procedure: COLONOSCOPY WITH PROPOFOL;  Surgeon: [name], MD;  Location[facility] ENDOSCOPY;  Service: Gastroenterology;  Laterality: N/A;  REQUESTS ABOUT 9 AM  ARRIVAL   Social History   Tobacco Use   Smoking status: Every Day    Current packs/day: 0.50    Average packs/day: 0.5 packs/day for 35.0 years (17.5 ttl pk-yrs)    Types: Cigarettes   Smokeless tobacco: Never   Tobacco comments:    Patient is at [facility] for alcohol abuse  Vaping Use   Vaping status: Never Used  Substance Use Topics   Alcohol use: Not Currently    Comment: 80 oz beer daily, 1/2 pint liquor daily, last use 07/19/22   Drug use: Not Currently    Types: Marijuana, "Crack" cocaine    Comment: last use mid 1990s   Social History   Socioeconomic History   Marital status: Divorced    Spouse name: Not on file   Number of children: Not on file   Years of education: Not on file   Highest education level: Not on file  Occupational History   Not on file  Tobacco Use   Smoking status: Every Day    Current packs/day: 0.50    Average packs/day: 0.5 packs/day for 35.0 years (17.5 ttl pk-yrs)    Types: Cigarettes   Smokeless tobacco: Never   Tobacco comments:    Patient is at [facility] for alcohol abuse  Vaping Use   Vaping status: Never Used  Substance and Sexual Activity   Alcohol use: Not Currently    Comment: 80 oz beer daily, 1/2 pint liquor daily, last use 07/19/22   Drug use: Not  Currently    Types: Marijuana, "Crack" cocaine    Comment: last use mid 1990s   Sexual activity: Not Currently    Birth control/protection: None  Other Topics Concern   Not on file  Social History Narrative   Not on file   Social Determinants of Health   Financial Resource Strain: Low Risk  (01/11/2023)   Overall Financial Resource Strain (CARDIA)    Difficulty of Paying Living Expenses: Not hard at all  Food Insecurity: No Food Insecurity (10/13/2022)   Hunger Vital Sign    Worried About Running Out of Food in the Last Year: Never true    Ran Out of Food in the Last Year: Never true  Transportation Needs: No Transportation Needs (10/13/2022)   PRAPARE - Scientist, research (physical sciences) (Medical): No    Lack of Transportation (Non-Medical): No  Physical Activity: Insufficiently Active (01/11/2023)   Exercise Vital Sign    Days of Exercise per Week: 2 days    Minutes of Exercise per Session: 20 min  Stress: No Stress Concern Present (01/11/2023)   [employer] of Occupational Health - Occupational Stress Questionnaire    Feeling of Stress : Not at all  Social Connections: Socially Isolated (01/11/2023)   Social Connection and Isolation Panel [NHANES]    Frequency of Communication with Friends and Family: Three times a week    Frequency of Social Gatherings with Friends and Family: Three times a week    Attends Religious Services: Never    Active Member of Clubs or Organizations: No    Attends Banker Meetings: Never    Marital Status: Divorced  Catering manager Violence: Not At Risk (07/20/2022)   Humiliation, Afraid, Rape, and Kick questionnaire    Fear of Current or Ex-Partner: No    Emotionally Abused: No    Physically Abused: No    Sexually Abused: No   Family Status  Relation Name Status   Mother  Alive   Father  Deceased   MGM  Deceased   MGF  Deceased   PGM  Deceased   PGF  Deceased  No partnership data on file   Family History  Problem Relation Age of Onset   Hypertension Mother    Other Father        unknown medical history   Diabetes Maternal Grandmother    Other Maternal Grandfather        unknown medical history   Other Paternal Grandmother        unknown medical history   Other Paternal Grandfather        unknown medical history   No Known Allergies    Review of Systems  Constitutional: Negative.   HENT: Negative.    Eyes: Negative.   Respiratory: Negative.    Cardiovascular: Negative.   Gastrointestinal: Negative.   Genitourinary: Negative.   Musculoskeletal: Negative.   Skin: Negative.   Neurological: Negative.   Endo/Heme/Allergies: Negative.   Psychiatric/Behavioral: Negative.         Objective:     BP 138/79   Pulse (!) 56   Ht 5\' 11"  (1.803 m)   Wt 194 lb 11.2 oz (88.3 kg)   SpO2 98%   BMI 27.16 kg/m  BP Readings from Last 3 Encounters:  01/11/23 138/79  11/09/22 106/68  10/28/22 129/77   Wt Readings from Last 3 Encounters:  01/11/23 194 lb 11.2 oz (88.3 kg)  11/09/22 193 lb 11.2 oz (87.9 kg)  10/28/22 189 lb 3.2  oz (85.8 kg)      Physical Exam HENT:     Head: Normocephalic.     Mouth/Throat:     Mouth: Mucous membranes are moist.  Eyes:     Pupils: Pupils are equal, round, and reactive to light.  Cardiovascular:     Rate and Rhythm: Normal rate and regular rhythm.  Pulmonary:     Breath sounds: Normal breath sounds.  Abdominal:     General: Abdomen is flat.  Musculoskeletal:        General: Normal range of motion.     Cervical back: Normal range of motion.  Skin:    General: Skin is warm.  Neurological:     Mental Status: He is alert and oriented to person, place, and time.  Psychiatric:        Mood and Affect: Mood normal.        Behavior: Behavior normal.        Thought Content: Thought content normal.        Judgment: Judgment normal.      No results found for any visits on 01/11/23.  Last CBC Lab Results  Component Value Date   WBC 5.7 10/13/2022   HGB 13.3 10/13/2022   HCT 39.6 10/13/2022   MCV 92 10/13/2022   MCH 30.7 10/13/2022   RDW 11.2 (L) 10/13/2022   PLT 252 10/13/2022   Last metabolic panel Lab Results  Component Value Date   GLUCOSE 85 10/13/2022   NA 139 10/13/2022   K 4.5 10/13/2022   CL 103 10/13/2022   CO2 24 10/13/2022   BUN 10 10/13/2022   CREATININE 0.82 10/13/2022   EGFR 101 10/13/2022   CALCIUM 9.8 10/13/2022   PROT 7.3 10/13/2022   ALBUMIN 4.6 10/13/2022   LABGLOB 2.7 10/13/2022   AGRATIO 1.7 10/13/2022   BILITOT 1.3 (H) 10/13/2022   ALKPHOS 133 (H) 10/13/2022   AST 20 10/13/2022   ALT 15 10/13/2022   ANIONGAP 8 07/20/2022   Last lipids Lab Results  Component Value Date   CHOL 171  01/04/2023   HDL 45 01/04/2023   LDLCALC 116 (H) 01/04/2023   TRIG 49 01/04/2023   CHOLHDL 3.8 01/04/2023   Last hemoglobin A1c Lab Results  Component Value Date   HGBA1C 4.4 12/22/2014   Last thyroid functions Lab Results  Component Value Date   TSH 1.603 10/25/2014   Last vitamin D No results found for: "25OHVITD2", "25OHVITD3", "VD25OH" Last vitamin B12 and Folate Lab Results  Component Value Date   VITAMINB12 550 07/28/2021   FOLATE 8.8 07/28/2021      The 10-year ASCVD risk score (Arnett DK, et al., 2019) is: 24.7%    Assessment & Plan:  1. Tobacco use disorder Education was provided to patient about the importance of smoking cessation. He verbalized understanding, and was strongly encouraged to cut down.  2. Elevated lipids The 10-year ASCVD risk score (Arnett DK, et al., 2019) is: 24.7%   Values used to calculate the score:     Age: 52 years     Sex: Male     Is Non-Hispanic African American: Yes     Diabetic: No     Tobacco smoker: Yes     Systolic Blood Pressure: 138 mmHg     Is BP treated: Yes     HDL Cholesterol: 45 mg/dL     Total Cholesterol: 171 mg/dL  His ASCVD risk was 08.6 %, he was started on Rosuvastatin 5 mg. Educated on medication side  effects and advised to notify clinic. He was encouraged to continue on low fat/cholesterol diet and exercise as tolerated.  - rosuvastatin (CRESTOR) 5 MG tablet; Take 1 tablet (5 mg total) by mouth daily.  Dispense: 30 tablet; Refill: 0  3. HTN (hypertension), benign - His blood pressure is improving, he will continue on current medication, DASH diet and exercise as tolerated. - amLODipine (NORVASC) 10 MG tablet; Take 1 tablet (10 mg total) by mouth daily.  Dispense: 90 tablet; Refill: 1   Return in about 4 weeks (around 02/08/2023), or if symptoms worsen or fail to improve.    Chioma Trellis Paganini, NP

## 2023-01-12 ENCOUNTER — Other Ambulatory Visit: Payer: Self-pay

## 2023-01-20 ENCOUNTER — Other Ambulatory Visit: Payer: Self-pay

## 2023-01-22 ENCOUNTER — Other Ambulatory Visit: Payer: Self-pay

## 2023-01-23 ENCOUNTER — Other Ambulatory Visit: Payer: Self-pay

## 2023-01-24 ENCOUNTER — Other Ambulatory Visit: Payer: Self-pay

## 2023-01-27 ENCOUNTER — Other Ambulatory Visit: Payer: Self-pay

## 2023-01-30 ENCOUNTER — Other Ambulatory Visit: Payer: Self-pay

## 2023-01-31 ENCOUNTER — Other Ambulatory Visit: Payer: Self-pay

## 2023-02-08 ENCOUNTER — Ambulatory Visit: Payer: Self-pay | Admitting: Gerontology

## 2023-02-09 ENCOUNTER — Ambulatory Visit: Payer: Self-pay | Admitting: Gerontology

## 2023-02-09 ENCOUNTER — Telehealth: Payer: Self-pay

## 2023-02-09 VITALS — BP 148/85 | HR 75 | Temp 97.8°F | Ht 71.0 in | Wt 201.1 lb

## 2023-02-09 DIAGNOSIS — E785 Hyperlipidemia, unspecified: Secondary | ICD-10-CM

## 2023-02-09 MED ORDER — ROSUVASTATIN CALCIUM 5 MG PO TABS
5.0000 mg | ORAL_TABLET | Freq: Every day | ORAL | 0 refills | Status: DC
Start: 2023-02-09 — End: 2023-12-30
  Filled 2023-02-09: qty 60, 60d supply, fill #0

## 2023-02-09 NOTE — Progress Notes (Signed)
Established Patient Office Visit  Subjective   Patient ID: Matthew [name], male    DOB: [birth year]  Age: 62 y.o. MRN: [id number]  No chief complaint on file.   HPI Matthew [name] is 62 year old male who has history of hypertension, pneumothorax, anxiety, and depression who presents for follow up visit. His ASCVD risk was 24.7 %, he was started on Rosuvastatin 5 mg one tablet by mouth daily. He states that he is compliant with the medication and tolerates it well. He denies any headaches, muscle pains and aches, abdominal pain, weakness and nausea. He states that he smokes half pack  of cigarette daily and he is not willing to stop smoking. He denies chest pain, palpitation, light headedness and vision changes. Overall, he states that he's doing well and offers no further complaint.   Patient Active Problem List   Diagnosis Date Noted   Elevated lipids 11/09/2022   Adenomatous polyp of colon 10/28/2022   Screen for colon cancer 10/13/2022   MDD (major depressive disorder) 07/20/2022   Seizure (HCC) 07/28/2021   Macrocytic anemia 07/28/2021   Aftercare 02/11/2015   Pain and swelling of right elbow 01/07/2015   Pulmonary infiltrate    Contusion of right shoulder    Eczema 10/30/2014   Alcohol use disorder, severe, dependence (HCC) 10/24/2014   Tobacco use disorder 10/24/2014   HTN (hypertension), benign 10/24/2014   Major depressive disorder, single episode, severe without psychotic features (HCC) 10/24/2014   Past Medical History:  Diagnosis Date   Hypertension    Pneumothorax 12/15/2014   Substance abuse (HCC)    alcohol   Past Surgical History:  Procedure Laterality Date   APPENDECTOMY     COLONOSCOPY WITH PROPOFOL N/A 10/28/2022   Procedure: COLONOSCOPY WITH PROPOFOL;  Surgeon: [name], MD;  Location: [facility] ENDOSCOPY;  Service: Gastroenterology;  Laterality: N/A;  REQUESTS ABOUT 9 AM ARRIVAL   Social History   Tobacco Use   Smoking status: Every Day    Current  packs/day: 0.50    Average packs/day: 0.5 packs/day for 35.0 years (17.5 ttl pk-yrs)    Types: Cigarettes   Smokeless tobacco: Never   Tobacco comments:    Patient is at [facility] for alcohol abuse  Vaping Use   Vaping status: Never Used  Substance Use Topics   Alcohol use: Not Currently    Comment: 80 oz beer daily, 1/2 pint liquor daily, last use 07/19/22   Drug use: Not Currently    Types: Marijuana, "Crack" cocaine    Comment: last use mid 1990s   Social History   Socioeconomic History   Marital status: Divorced    Spouse name: Not on file   Number of children: Not on file   Years of education: Not on file   Highest education level: Not on file  Occupational History   Not on file  Tobacco Use   Smoking status: Every Day    Current packs/day: 0.50    Average packs/day: 0.5 packs/day for 35.0 years (17.5 ttl pk-yrs)    Types: Cigarettes   Smokeless tobacco: Never   Tobacco comments:    Patient is at [facility] for alcohol abuse  Vaping Use   Vaping status: Never Used  Substance and Sexual Activity   Alcohol use: Not Currently    Comment: 80 oz beer daily, 1/2 pint liquor daily, last use 07/19/22   Drug use: Not Currently    Types: Marijuana, "Crack" cocaine    Comment: last use mid 1990s  Sexual activity: Not Currently    Birth control/protection: None  Other Topics Concern   Not on file  Social History Narrative   Not on file   Social Determinants of Health   Financial Resource Strain: Low Risk  (01/11/2023)   Overall Financial Resource Strain (CARDIA)    Difficulty of Paying Living Expenses: Not hard at all  Food Insecurity: No Food Insecurity (10/13/2022)   Hunger Vital Sign    Worried About Running Out of Food in the Last Year: Never true    Ran Out of Food in the Last Year: Never true  Transportation Needs: No Transportation Needs (10/13/2022)   PRAPARE - Administrator, Civil Service (Medical): No    Lack of Transportation (Non-Medical): No  Physical  Activity: Insufficiently Active (01/11/2023)   Exercise Vital Sign    Days of Exercise per Week: 2 days    Minutes of Exercise per Session: 20 min  Stress: No Stress Concern Present (01/11/2023)   [employer] of Occupational Health - Occupational Stress Questionnaire    Feeling of Stress : Not at all  Social Connections: Socially Isolated (01/11/2023)   Social Connection and Isolation Panel [NHANES]    Frequency of Communication with Friends and Family: Three times a week    Frequency of Social Gatherings with Friends and Family: Three times a week    Attends Religious Services: Never    Active Member of Clubs or Organizations: No    Attends Banker Meetings: Never    Marital Status: Divorced  Catering manager Violence: Not At Risk (07/20/2022)   Humiliation, Afraid, Rape, and Kick questionnaire    Fear of Current or Ex-Partner: No    Emotionally Abused: No    Physically Abused: No    Sexually Abused: No   Family Status  Relation Name Status   Mother  Alive   Father  Deceased   MGM  Deceased   MGF  Deceased   PGM  Deceased   PGF  Deceased  No partnership data on file   Family History  Problem Relation Age of Onset   Hypertension Mother    Other Father        unknown medical history   Diabetes Maternal Grandmother    Other Maternal Grandfather        unknown medical history   Other Paternal Grandmother        unknown medical history   Other Paternal Grandfather        unknown medical history   No Known Allergies    Review of Systems  Constitutional: Negative.   HENT: Negative.    Eyes: Negative.   Respiratory: Negative.    Cardiovascular: Negative.   Gastrointestinal: Negative.   Genitourinary: Negative.   Musculoskeletal: Negative.   Skin: Negative.   Neurological: Negative.   Endo/Heme/Allergies: Negative.   Psychiatric/Behavioral: Negative.        Objective:     BP (!) 148/85 (BP Location: Left Arm)   Pulse 75   Temp 97.8 F (36.6  C)   Ht 5\' 11"  (1.803 m)   Wt 201 lb 1.6 oz (91.2 kg)   SpO2 97%   BMI 28.05 kg/m  BP Readings from Last 3 Encounters:  02/09/23 (!) 148/85  01/11/23 138/79  11/09/22 106/68   Wt Readings from Last 3 Encounters:  02/09/23 201 lb 1.6 oz (91.2 kg)  01/11/23 194 lb 11.2 oz (88.3 kg)  11/09/22 193 lb 11.2 oz (87.9 kg)  Physical Exam Constitutional:      Appearance: Normal appearance.  HENT:     Head: Normocephalic.  Eyes:     Pupils: Pupils are equal, round, and reactive to light.  Cardiovascular:     Rate and Rhythm: Normal rate and regular rhythm.  Pulmonary:     Effort: Pulmonary effort is normal.     Breath sounds: Normal breath sounds.  Abdominal:     General: Abdomen is flat.  Musculoskeletal:        General: Normal range of motion.     Cervical back: Normal range of motion.  Skin:    General: Skin is warm and dry.  Neurological:     General: No focal deficit present.     Mental Status: He is alert and oriented to person, place, and time.  Psychiatric:        Mood and Affect: Mood normal.        Behavior: Behavior normal.        Thought Content: Thought content normal.        Judgment: Judgment normal.      No results found for any visits on 02/09/23.  Last CBC Lab Results  Component Value Date   WBC 5.7 10/13/2022   HGB 13.3 10/13/2022   HCT 39.6 10/13/2022   MCV 92 10/13/2022   MCH 30.7 10/13/2022   RDW 11.2 (L) 10/13/2022   PLT 252 10/13/2022   Last metabolic panel Lab Results  Component Value Date   GLUCOSE 85 10/13/2022   NA 139 10/13/2022   K 4.5 10/13/2022   CL 103 10/13/2022   CO2 24 10/13/2022   BUN 10 10/13/2022   CREATININE 0.82 10/13/2022   EGFR 101 10/13/2022   CALCIUM 9.8 10/13/2022   PROT 7.3 10/13/2022   ALBUMIN 4.6 10/13/2022   LABGLOB 2.7 10/13/2022   AGRATIO 1.7 10/13/2022   BILITOT 1.3 (H) 10/13/2022   ALKPHOS 133 (H) 10/13/2022   AST 20 10/13/2022   ALT 15 10/13/2022   ANIONGAP 8 07/20/2022   Last  lipids Lab Results  Component Value Date   CHOL 171 01/04/2023   HDL 45 01/04/2023   LDLCALC 116 (H) 01/04/2023   TRIG 49 01/04/2023   CHOLHDL 3.8 01/04/2023   Last hemoglobin A1c Lab Results  Component Value Date   HGBA1C 4.4 12/22/2014   Last thyroid functions Lab Results  Component Value Date   TSH 1.603 10/25/2014   Last vitamin D No results found for: "25OHVITD2", "25OHVITD3", "VD25OH" Last vitamin B12 and Folate Lab Results  Component Value Date   VITAMINB12 550 07/28/2021   FOLATE 8.8 07/28/2021      The 10-year ASCVD risk score (Arnett DK, et al., 2019) is: 27.7%    Assessment & Plan:  1. Elevated lipids The 10-year ASCVD risk score (Arnett DK, et al., 2019) is: 27.7%   Values used to calculate the score:     Age: 51 years     Sex: Male     Is Non-Hispanic African American: Yes     Diabetic: No     Tobacco smoker: Yes     Systolic Blood Pressure: 148 mmHg     Is BP treated: Yes     HDL Cholesterol: 45 mg/dL     Total Cholesterol: 171 mg/dL  - Rosuvastatin (CRESTOR) 5 MG tablet; Take 1 tablet (5 mg total) by mouth daily.  Dispense: 60 tablet; Refill: 0 - He was encouraged to continue on current medications, eat low fat/cholesterol diet and  exercise as tolerated.  -Educated on medication side effects and advised to notify clinic.  - Lipid panel; Future     Return in about 7 weeks (around 03/30/2023), or if symptoms worsen or fail to improve.    Chioma Trellis Paganini, NP

## 2023-02-09 NOTE — Telephone Encounter (Signed)
Called pt to R/s appt, new appt made.

## 2023-02-09 NOTE — Patient Instructions (Signed)
Dyslipidemia Dyslipidemia is an imbalance of waxy, fat-like substances (lipids) in the blood. The body needs lipids in small amounts. Dyslipidemia often involves a high level of cholesterol or triglycerides, which are types of lipids. Common forms of dyslipidemia include: High levels of LDL cholesterol. LDL is the type of cholesterol that causes fatty deposits (plaques) to build up in the blood vessels that carry blood away from the heart (arteries). Low levels of HDL cholesterol. HDL cholesterol is the type of cholesterol that protects against heart disease. High levels of HDL remove the LDL buildup from arteries. High levels of triglycerides. Triglycerides are a fatty substance in the blood that is linked to a buildup of plaques in the arteries. What are the causes? There are two main types of dyslipidemia: primary and secondary. Primary dyslipidemia is caused by changes (mutations) in genes that are passed down through families (inherited). These mutations cause several types of dyslipidemia. Secondary dyslipidemia may be caused by various risk factors that can lead to the disease, such as lifestyle choices and certain medical conditions. What increases the risk? You are more likely to develop this condition if you are an older man or if you are a woman who has gone through menopause. Other risk factors include: Having a family history of dyslipidemia. Taking certain medicines, including birth control pills, steroids, some diuretics, and beta-blockers. Eating a diet high in saturated fat. Smoking cigarettes or excessive alcohol intake. Having certain medical conditions such as diabetes, polycystic ovary syndrome (PCOS), kidney disease, liver disease, or hypothyroidism. Not exercising regularly. Being overweight or obese with too much belly fat. What are the signs or symptoms? In most cases, dyslipidemia does not usually cause any symptoms. In severe cases, very high lipid levels can  cause: Fatty bumps under the skin (xanthomas). A white or gray ring around the black center (pupil) of the eye. Very high triglyceride levels can cause inflammation of the pancreas (pancreatitis). How is this diagnosed? Your health care provider may diagnose dyslipidemia based on a routine blood test (fasting blood test). Because most people do not have symptoms of the condition, this blood testing (lipid profile) is done on adults age 40 and older and is repeated every 4-6 years. This test checks: Total cholesterol. This measures the total amount of cholesterol in your blood, including LDL cholesterol, HDL cholesterol, and triglycerides. A healthy number is below 200 mg/dL (2.95 mmol/L). LDL cholesterol. The target number for LDL cholesterol is different for each person, depending on individual risk factors. A healthy number is usually below 100 mg/dL (6.21 mmol/L). Ask your health care provider what your LDL cholesterol should be. HDL cholesterol. An HDL level of 60 mg/dL (3.08 mmol/L) or higher is best because it helps to protect against heart disease. A number below 40 mg/dL (6.57 mmol/L) for men or below 50 mg/dL (8.46 mmol/L) for women increases the risk for heart disease. Triglycerides. A healthy triglyceride number is below 150 mg/dL (9.62 mmol/L). If your lipid profile is abnormal, your health care provider may do other blood tests. How is this treated? Treatment depends on the type of dyslipidemia that you have and your other risk factors for heart disease and stroke. Your health care provider will have a target range for your lipid levels based on this information. Treatment for dyslipidemia starts with lifestyle changes, such as diet and exercise. Your health care provider may recommend that you: Get regular exercise. Make changes to your diet. Quit smoking if you smoke. Limit your alcohol intake. If diet  changes and exercise do not help you reach your goals, your health care provider  may also prescribe medicine to lower lipids. The most commonly prescribed type of medicine lowers your LDL cholesterol (statin drug). If you have a high triglyceride level, your provider may prescribe another type of drug (fibrate) or an omega-3 fish oil supplement, or both. Follow these instructions at home: Eating and drinking  Follow instructions from your health care provider or dietitian about eating or drinking restrictions. Eat a healthy diet as told by your health care provider. This can help you reach and maintain a healthy weight, lower your LDL cholesterol, and raise your HDL cholesterol. This may include: Limiting your calories, if you are overweight. Eating more fruits, vegetables, whole grains, fish, and lean meats. Limiting saturated fat, trans fat, and cholesterol. Do not drink alcohol if: Your health care provider tells you not to drink. You are pregnant, may be pregnant, or are planning to become pregnant. If you drink alcohol: Limit how much you have to: 0-1 drink a day for women. 0-2 drinks a day for men. Know how much alcohol is in your drink. In the [place] one drink equals one 12 oz bottle of beer (355 mL), one 5 oz glass of wine (148 mL), or one 1 oz glass of hard liquor (44 mL). Activity Get regular exercise. Start an exercise and strength training program as told by your health care provider. Ask your health care provider what activities are safe for you. Your health care provider may recommend: 30 minutes of aerobic activity 4-6 days a week. Brisk walking is an example of aerobic activity. Strength training 2 days a week. General instructions Do not use any products that contain nicotine or tobacco. These products include cigarettes, chewing tobacco, and vaping devices, such as e-cigarettes. If you need help quitting, ask your health care provider. Take over-the-counter and prescription medicines only as told by your health care provider. This includes  supplements. Keep all follow-up visits. This is important. Contact a health care provider if: You are having trouble sticking to your exercise or diet plan. You are struggling to quit smoking or to control your use of alcohol. Summary Dyslipidemia often involves a high level of cholesterol or triglycerides, which are types of lipids. Treatment depends on the type of dyslipidemia that you have and your other risk factors for heart disease and stroke. Treatment for dyslipidemia starts with lifestyle changes, such as diet and exercise. Your health care provider may prescribe medicine to lower lipids. This information is not intended to replace advice given to you by your health care provider. Make sure you discuss any questions you have with your health care provider. Document Revised: 12/03/2021 Document Reviewed: 07/06/2020 Elsevier Patient Education  2024 [employer].

## 2023-02-10 ENCOUNTER — Other Ambulatory Visit: Payer: Self-pay

## 2023-02-16 ENCOUNTER — Other Ambulatory Visit: Payer: Self-pay

## 2023-02-22 ENCOUNTER — Other Ambulatory Visit: Payer: Self-pay

## 2023-02-22 MED ORDER — ESCITALOPRAM OXALATE 10 MG PO TABS
10.0000 mg | ORAL_TABLET | Freq: Every day | ORAL | 1 refills | Status: DC
  Filled 2023-02-22: qty 30, 30d supply, fill #0

## 2023-03-06 ENCOUNTER — Other Ambulatory Visit: Payer: Self-pay

## 2023-03-14 ENCOUNTER — Other Ambulatory Visit: Payer: Self-pay

## 2023-03-23 ENCOUNTER — Other Ambulatory Visit: Payer: Self-pay

## 2023-04-18 ENCOUNTER — Other Ambulatory Visit: Payer: Self-pay

## 2023-12-23 ENCOUNTER — Other Ambulatory Visit: Payer: Self-pay

## 2023-12-23 ENCOUNTER — Emergency Department
Admission: EM | Admit: 2023-12-23 | Discharge: 2023-12-24 | Disposition: A | Discharge status: Other Acute Inpatient Hospital | Payer: 59

## 2023-12-23 ENCOUNTER — Encounter: Payer: Self-pay | Admitting: Psychiatry

## 2023-12-23 DIAGNOSIS — F17200 Nicotine dependence, unspecified, uncomplicated: Secondary | ICD-10-CM | POA: Insufficient documentation

## 2023-12-23 DIAGNOSIS — F1914 Other psychoactive substance abuse with psychoactive substance-induced mood disorder: Secondary | ICD-10-CM | POA: Insufficient documentation

## 2023-12-23 DIAGNOSIS — R45851 Suicidal ideations: Secondary | ICD-10-CM | POA: Diagnosis not present

## 2023-12-23 DIAGNOSIS — F332 Major depressive disorder, recurrent severe without psychotic features: Secondary | ICD-10-CM | POA: Diagnosis not present

## 2023-12-23 DIAGNOSIS — Y908 Blood alcohol level of 240 mg/100 ml or more: Secondary | ICD-10-CM | POA: Insufficient documentation

## 2023-12-23 DIAGNOSIS — F1020 Alcohol dependence, uncomplicated: Secondary | ICD-10-CM | POA: Diagnosis present

## 2023-12-23 LAB — CBC WITH DIFFERENTIAL/PLATELET
Abs Immature Granulocytes: 0.03 K/uL (ref 0.00–0.07)
Basophils Absolute: 0 K/uL (ref 0.0–0.1)
Basophils Relative: 1 %
Eosinophils Absolute: 0 K/uL (ref 0.0–0.5)
Eosinophils Relative: 0 %
HCT: 45.4 % (ref 39.0–52.0)
Hemoglobin: 15.3 g/dL (ref 13.0–17.0)
Immature Granulocytes: 0 %
Lymphocytes Relative: 22 %
Lymphs Abs: 1.5 K/uL (ref 0.7–4.0)
MCH: 32 pg (ref 26.0–34.0)
MCHC: 33.7 g/dL (ref 30.0–36.0)
MCV: 95 fL (ref 80.0–100.0)
Monocytes Absolute: 0.4 K/uL (ref 0.1–1.0)
Monocytes Relative: 6 %
Neutro Abs: 4.9 K/uL (ref 1.7–7.7)
Neutrophils Relative %: 71 %
Platelets: 252 K/uL (ref 150–400)
RBC: 4.78 MIL/uL (ref 4.22–5.81)
RDW: 11.9 % (ref 11.5–15.5)
WBC: 6.8 K/uL (ref 4.0–10.5)
nRBC: 0 % (ref 0.0–0.2)

## 2023-12-23 LAB — COMPREHENSIVE METABOLIC PANEL WITH GFR
ALT: 51 U/L — ABNORMAL HIGH (ref 0–44)
AST: 160 U/L — ABNORMAL HIGH (ref 15–41)
Albumin: 4.1 g/dL (ref 3.5–5.0)
Alkaline Phosphatase: 93 U/L (ref 38–126)
Anion gap: 14 (ref 5–15)
BUN: 14 mg/dL (ref 8–23)
CO2: 23 mmol/L (ref 22–32)
Calcium: 8.8 mg/dL — ABNORMAL LOW (ref 8.9–10.3)
Chloride: 92 mmol/L — ABNORMAL LOW (ref 98–111)
Creatinine, Ser: 1.01 mg/dL (ref 0.61–1.24)
GFR, Estimated: >60 mL/min (ref >60)
Glucose, Bld: 74 mg/dL (ref 70–99)
Potassium: 4.3 mmol/L (ref 3.5–5.1)
Sodium: 129 mmol/L — ABNORMAL LOW (ref 135–145)
Total Bilirubin: 2.8 mg/dL — ABNORMAL HIGH (ref 0.0–1.2)
Total Protein: 7.4 g/dL (ref 6.5–8.1)

## 2023-12-23 LAB — ETHANOL: Alcohol, Ethyl (B): 268 mg/dL — ABNORMAL HIGH (ref <15)

## 2023-12-23 MED ORDER — LORAZEPAM 1 MG PO TABS: 1.0000 mg | ORAL_TABLET | Freq: Four times a day (QID) | ORAL | Status: DC

## 2023-12-23 MED ORDER — ACETAMINOPHEN 325 MG PO TABS: 650.0000 mg | ORAL_TABLET | Freq: Four times a day (QID) | ORAL | Status: DC | PRN

## 2023-12-23 MED ORDER — LORAZEPAM 2 MG PO TABS: 0.0000 mg | ORAL_TABLET | Freq: Four times a day (QID) | ORAL | Status: DC

## 2023-12-23 MED ORDER — LORAZEPAM 2 MG PO TABS: 0.0000 mg | ORAL_TABLET | Freq: Two times a day (BID) | ORAL | Status: DC

## 2023-12-23 MED ORDER — LORAZEPAM 1 MG PO TABS: 1.0000 mg | ORAL_TABLET | Freq: Two times a day (BID) | ORAL | Status: DC

## 2023-12-23 MED ORDER — THIAMINE HCL 100 MG/ML IJ SOLN: 100.0000 mg | Freq: Every day | INTRAMUSCULAR | Status: DC

## 2023-12-23 MED ORDER — THIAMINE MONONITRATE 100 MG PO TABS
100.0000 mg | ORAL_TABLET | Freq: Every day | ORAL | Status: DC
  Administered 2023-12-23: 100 mg via ORAL
  Filled 2023-12-23: qty 1

## 2023-12-23 NOTE — ED Triage Notes (Signed)
 Pt reports  I am in a bad place, I don't want to go home RN asked pt if any thoughts of harming himself pt replies yes denies HI denies visual or auditory hallucinations. Pt quiet in triage does not elaborate what he is feeling. RN asked pt if any plan to hurt himself I can't think of one

## 2023-12-23 NOTE — ED Provider Notes (Signed)
 [facility] Provider Note    Event Date/Time   First MD Initiated Contact with Patient 12/23/23 1520     (approximate)   History   Psychiatric Evaluation  Pt reports  I am in a bad place, I don't want to go home RN asked pt if any thoughts of harming himself pt replies yes denies HI denies visual or auditory hallucinations. Pt quiet in triage does not elaborate what he is feeling. RN asked pt if any plan to hurt himself I can't think of one      HPI Matthew [name] is a 63 y.o. male PMH MDD, anxiety, alcohol use disorder, tobacco use presents for psychiatric evaluation - Patient endorses having thoughts of hurting himself but does not have a specific plan at this time.  Self presented to emergency department.  Notes recent stressors of having relapse in his drinking, has been drinking for the past 2 months, had previously been sober for about 1 year.  Drinks 3-4 beers this morning per his report.  Notes that he has recently had struggles with his relationship and that his car broke down as well and he is no longer stay at the place that he was previously living. - Denies any prior history of suicide attempt - Denies any other substance use - Denies homicidal ideation, hallucinations  Per chart review, appears patient was admitted in March 2024 for suicidal ideation.     Physical Exam   Triage Vital Signs: ED Triage Vitals  Encounter Vitals Group     BP 12/23/23 1327 (!) 111/94     Girls Systolic BP Percentile --      Girls Diastolic BP Percentile --      Boys Systolic BP Percentile --      Boys Diastolic BP Percentile --      Pulse Rate 12/23/23 1327 76     Resp 12/23/23 1327 16     Temp 12/23/23 1327 98.2 F (36.8 C)     Temp Source 12/23/23 1327 Oral     SpO2 12/23/23 1327 98 %     Weight 12/23/23 1328 170 lb (77.1 kg)     Height 12/23/23 1328 5' 11 (1.803 m)     Head Circumference --      Peak Flow --      Pain Score 12/23/23 1327 8      Pain Loc --      Pain Education --      Exclude from Growth Chart --     Most recent vital signs: Vitals:   12/23/23 1327  BP: (!) 111/94  Pulse: 76  Resp: 16  Temp: 98.2 F (36.8 C)  SpO2: 98%     General: Awake, no distress.  CV:  Good peripheral perfusion. RRR, RP 2+ Resp:  Normal effort. CTAB Abd:  No distention. Nontender to deep palpation throughout Psych:  +SI, denies HI, hallucinations.  Calm and cooperative.  Responds to all questions appropriately.   ED Results / Procedures / Treatments   Labs (all labs ordered are listed, but only abnormal results are displayed) Labs Reviewed  COMPREHENSIVE METABOLIC PANEL WITH GFR - Abnormal; Notable for the following components:      Result Value   Sodium 129 (*)    Chloride 92 (*)    Calcium  8.8 (*)    AST 160 (*)    ALT 51 (*)    Total Bilirubin 2.8 (*)    All other components within normal limits  ETHANOL - Abnormal; Notable for the following components:   Alcohol, Ethyl (B) 268 (*)    All other components within normal limits  CBC WITH DIFFERENTIAL/PLATELET  URINE DRUG SCREEN, QUALITATIVE ([facility] ONLY)     EKG  See ED course below.    RADIOLOGY N/a    PROCEDURES:  Critical Care performed: No  Procedures   MEDICATIONS ORDERED IN ED: Medications  LORazepam  (ATIVAN ) tablet 1 mg ( Oral See Alternative 12/23/23 2222)    Or  LORazepam  (ATIVAN ) tablet 0-4 mg ( Oral Not Given 12/23/23 2222)  LORazepam  (ATIVAN ) tablet 1 mg (has no administration in time range)    Or  LORazepam  (ATIVAN ) tablet 0-4 mg (has no administration in time range)  thiamine  (VITAMIN B1) tablet 100 mg (100 mg Oral Given 12/23/23 1645)    Or  thiamine  (VITAMIN B1) injection 100 mg ( Intravenous See Alternative 12/23/23 1645)  acetaminophen  (TYLENOL ) tablet 650 mg (has no administration in time range)     IMPRESSION / MDM / ASSESSMENT AND PLAN / ED COURSE  I reviewed the triage vital signs and the nursing notes.                               DDX/MDM/AP: Differential diagnosis includes, but is not limited to, primary psychiatric disorder, consider concomitant alcohol use contributing to presentation, do not suspect underlying organic etiology of presentation.  No evidence of EtOH withdrawal at this time, currently intoxicated, will place on CIWA to prevent any significant development.  Plan: - Labs - Involuntary psychiatric hold, does not definitively meet IVC criteria on my eval - Psychiatry and TTS consult  Patient's presentation is most consistent with acute presentation with potential threat to life or bodily function.  ED course below.  Labs with mild transaminitis in  an alcohol-induced pattern, suspect mild bilirubin elevation secondary to this as well, no tenderness to the palpation throughout abdomen, do not suspect acute underlying hepatobiliary pathology.  EtOH elevated.  Medically cleared.  Disposition per psychiatry.  Clinical Course as of 12/23/23 2358  Sat Dec 23, 2023  1627 Ecg = sinus rhythm, rate 83, no gross ST elevation or depression, no significant repolarization abnormality, normal axis, normal intervals.  QTc 434. [MM]    Clinical Course User Index [MM] Clarine Ozell LABOR, MD     FINAL CLINICAL IMPRESSION(S) / ED DIAGNOSES   Final diagnoses:  Suicidal ideation     Rx / DC Orders   ED Discharge Orders     None        Note:  This document was prepared using Dragon voice recognition software and may include unintentional dictation errors.   Clarine Ozell LABOR, MD 12/23/23 [phone]

## 2023-12-23 NOTE — ED Notes (Signed)
Patient is vol pending consult

## 2023-12-23 NOTE — Consult Note (Incomplete)
 [facility] Consult Note  Patient Name: Matthew [name] MRN: [id number] DOB: [birth year] DATE OF Consult: 12/23/2023  PRIMARY PSYCHIATRIC DIAGNOSES  1.  *** 2.  *** 3.  ***  RECOMMENDATIONS  Inpt psych admission recommended:    [x] YES       []  NO   If yes:       [x]   Pt meets involuntary commitment criteria if not voluntary       []    Pt does not meet involuntary commitment criteria and must be         voluntary. If patient is not voluntary, then discharge is recommended.   Medication recommendations:  re-initiation of escitalopram  10mg  po daily for mood;   re-initiation of hydroxyzine  10mg  po three times daily as needed for anxiety; re-initiation of trazodone  100mg  po bedtime as needed for sleep   Recommend repeat LFTs to ensure trending down; then consideration of naltrexone 50mg  po daily for alcohol cravings   Non-Medication recommendations:  Monitor CIWA, reported hx of ETOH withdrawal seizures;  recommend once mood stable to see if candidate for  discharge to [facility] again.  He was d/c to there 07/2022.    Communication: Treatment team members (and family members if applicable) who were involved in treatment/care discussions and planning, and with whom we spoke or engaged with via secure text/chat, include the following: Epic Chat Dr. Cyrena   Discussed concern for reported headache; potential hypertensive related previous dx of HTN with previous amlodipine     I have discussed my assessment and treatment recommendations with the patient. Possible medication side effects/risks/benefits of current regimen.   Importance of medication adherence for medication to be beneficial.   Follow-Up Telepsychiatry C/L services:            []  We will continue to follow this patient with you.             [x]  Will sign off for now. Please re-consult our service as necessary.  Thank you for involving us  in the care of this patient. If you have any additional questions or concerns, please call  [phone] and ask for me or the provider on-call.  TELEPSYCHIATRY ATTESTATION & CONSENT  As the provider for this telehealth consult, I attest that I verified the patient's identity using two separate identifiers, introduced myself to the patient, provided my credentials, disclosed my location, and performed this encounter via a HIPAA-compliant, real-time, face-to-face, two-way, interactive audio and video platform and with the full consent and agreement of the patient (or guardian as applicable.)  Patient physical location: [place] [facility] Tele[facility] provider physical location: home office in state of [place]  Video start time: 22:07 pm  (Central Time) Video end time: 22:30pm  (Central Time)  IDENTIFYING DATA  Matthew [name] is a 63 y.o. year-old male for whom a psychiatric consultation has been ordered by the primary provider. The patient was identified using two separate identifiers.  CHIEF COMPLAINT/REASON FOR CONSULT  I just need to get my life back together, I need help or I am not going to live  HISTORY OF PRESENT ILLNESS (HPI)  The patient presents to ED with worsening depression and SI after recent relapse on alcohol  Hx of treatment for MDD, Alcohol Use  He is not currently taking psychotropic medications  Reports I've lost everything, ex-wife, job, car.  Reports has become sober and I started back, when I started back is when bad things started happening;  Reports worsening in past 3 weeks. Was at [facility]  since Dec; relapsed one month ago; prior to that was at a residential tx center for 9 months; this has been his longest sobriety period.      Today, client  reports symptoms of depression with anergia, anhedonia, amotivation, feels helpless, hopeless, worthless, guilt over relapse; denied  anxiety or worry,  no reported panic symptoms, no reported obsessive/compulsive behaviors. Client reports SI with plans drowning, cutting wrist, walking into traffic, stated he fears moving  toward intent.   There is no evidence of psychosis or delusional thinking.  Denieve AVH;  Client denied past episodes of hypomania, hyperactivity, erratic/excessive spending, involvement in dangerous activities, self-inflated ego, grandiosity, or promiscuity.  sleeping 4-5hrs/24hrs, appetite today was first time ate since Wed, no appetite , concentration decreased No self-harm behaviors. Reviewed active medication list/reviewed labs. Obtained Collateral information from medical record.  EKG QtC 434  PAST PSYCHIATRIC HISTORY    Previous Psychiatric Hospitalizations: 1-2 times last here 07/2022  Previous Detox/Residential treatments:  [facility] and [facility] Outpt treatment:  none; was going to AA; stopped going about 2 weeks ago; no sponsor Previous psychotropic medication trials: hydroxyzine , escitalopram  trazodone  Previous mental health diagnosis per client/MEDICAL RECORD NUMBERMDD, Alcohol Use disorder  Suicide attempts/self-injurious behaviors:  denied history of suicidal/homicidal ideation/gestures; denied history of self-harm behaviors  History of trauma/abuse/neglect/exploitation:  hx witness fighting   PAST MEDICAL HISTORY  Past Medical History:  Diagnosis Date  . Hypertension   . Pneumothorax 12/15/2014  . Substance abuse (HCC)    alcohol     HOME MEDICATIONS  Facility Ordered Medications  Medication  . LORazepam  (ATIVAN ) tablet 1 mg   Or  . LORazepam  (ATIVAN ) tablet 0-4 mg  . [START ON 12/25/2023] LORazepam  (ATIVAN ) tablet 1 mg   Or  . [START ON 12/25/2023] LORazepam  (ATIVAN ) tablet 0-4 mg  . thiamine  (VITAMIN B1) tablet 100 mg   Or  . thiamine  (VITAMIN B1) injection 100 mg   PTA Medications  Medication Sig  . amLODipine  (NORVASC ) 10 MG tablet Take 1 tablet (10 mg total) by mouth daily.  . rosuvastatin  (CRESTOR ) 5 MG tablet Take 1 tablet (5 mg total) by mouth daily.  . escitalopram  (LEXAPRO ) 10 MG tablet Take 1 tablet (10 mg total) by mouth daily.    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 Tobacco products  Y  amount: 1ppd b. ETOH  Y  last drink  yesterday 12 pack Hx of cannabis and crack; none since 90's   Any history of substance related:  Blackouts:  +    Tremors: +  -  DUI: +  previous 3; 2016 D/T's: -  seizures: +  UDS not available for this encounter  BAL 268       FAMILY HISTORY  Family History  Problem Relation Age of Onset  . Hypertension Mother   . Other Father        unknown medical history  .  Diabetes Maternal Grandmother   . Other Maternal Grandfather        unknown medical history  . Other Paternal Grandmother        unknown medical history  . Other Paternal Grandfather        unknown medical history   Family Psychiatric History (if known):   denied psychiatric illnesses, substance use or suicides  MENTAL STATUS EXAM (MSE)  Mental Status Exam: General Appearance: Disheveled  Orientation:  Full (Time, Place, and Person)  Memory:  Immediate;   Good Recent;   Good Remote;   Good  Concentration:  Concentration: Fair  Recall:  Good  Attention  Fair  Eye Contact:  Minimal  Speech:  Clear and Coherent  Language:  Good  Volume:  Decreased  Mood: depressed  Affect:  Depressed  Thought Process:  Goal Directed  Thought Content:  Rumination  Suicidal Thoughts:  Yes.  with intent/plan  Homicidal Thoughts:  No  Judgement:  Fair  Insight:  Fair  Psychomotor Activity:  Decreased  Akathisia:  Negative  Fund of Knowledge:  Good    Assets:  Communication Skills Desire  for Improvement  Cognition:  WNL  ADL's:  Intact  AIMS (if indicated):       VITALS  Blood pressure (!) 111/94, pulse 76, temperature 98.2 F (36.8 C), temperature source Oral, resp. rate 16, height 5' 11 (1.803 m), weight 77.1 kg, SpO2 98%.  LABS  Admission on 12/23/2023  Component Date Value Ref Range Status  . Sodium 12/23/2023 129 (L)  135 - 145 mmol/L Final  . Potassium 12/23/2023 4.3  3.5 - 5.1 mmol/L Final  . Chloride 12/23/2023 92 (L)  98 - 111 mmol/L Final  . CO2 12/23/2023 23  22 - 32 mmol/L Final  . Glucose, Bld 12/23/2023 74  70 - 99 mg/dL Final   Glucose reference range applies only to samples taken after fasting for at least 8 hours.  . BUN 12/23/2023 14  8 - 23 mg/dL Final  . Creatinine, Ser 12/23/2023 1.01  0.61 - 1.24 mg/dL Final  . Calcium  12/23/2023 8.8 (L)  8.9 - 10.3 mg/dL Final  . Total Protein 12/23/2023 7.4  6.5 - 8.1 g/dL Final  . Albumin 91/90/7974 4.1  3.5 - 5.0 g/dL Final  . AST 91/90/7974 160 (H)  15 - 41 U/L Final  . ALT 12/23/2023 51 (H)  0 - 44 U/L Final  . Alkaline Phosphatase 12/23/2023 93  38 - 126 U/L Final  . Total Bilirubin 12/23/2023 2.8 (H)  0.0 - 1.2 mg/dL Final  . GFR, Estimated 12/23/2023 >60  >60 mL/min Final   Comment: (NOTE) Calculated using the CKD-EPI Creatinine Equation (2021)   . Anion gap 12/23/2023 14  5 - 15 Final   Performed at [facility], [address] [place], [place] [address]  . Alcohol, Ethyl (B) 12/23/2023 268 (H)  <15 mg/dL Final   Comment: (NOTE) For medical purposes only. Performed at [facility], [address], [place], [place] [address]   . WBC 12/23/2023 6.8  4.0 - 10.5 K/uL Final  . RBC 12/23/2023 4.78  4.22 - 5.81 MIL/uL Final  . Hemoglobin 12/23/2023 15.3  13.0 - 17.0 g/dL Final  . HCT 91/90/7974 45.4  39.0 - 52.0 % Final  . MCV 12/23/2023 95.0  80.0 - 100.0 fL Final  . MCH 12/23/2023 32.0  26.0 - 34.0 pg Final  . MCHC 12/23/2023 33.7  30.0 - 36.0 g/dL Final  . RDW 91/90/7974  11.9  11.5 - 15.5 % Final  . Platelets 12/23/2023 252  150 - 400 K/uL Final  . nRBC 12/23/2023 0.0  0.0 - 0.2 % Final  . Neutrophils Relative % 12/23/2023 71  % Final  . Neutro Abs 12/23/2023 4.9  1.7 - 7.7 K/uL Final  . Lymphocytes Relative 12/23/2023 22  % Final  . Lymphs Abs 12/23/2023 1.5  0.7 - 4.0 K/uL Final  . Monocytes Relative 12/23/2023 6  % Final  . Monocytes Absolute 12/23/2023 0.4  0.1 - 1.0 K/uL Final  . Eosinophils Relative 12/23/2023 0  % Final  . Eosinophils Absolute 12/23/2023 0.0  0.0 - 0.5 K/uL Final  . Basophils Relative 12/23/2023 1  % Final  . Basophils Absolute 12/23/2023 0.0  0.0 - 0.1 K/uL Final  . Immature Granulocytes 12/23/2023 0  % Final  . Abs Immature Granulocytes 12/23/2023 0.03  0.00 - 0.07 K/uL Final   Performed at [facility], [address], [place], [place] [address]    PSYCHIATRIC REVIEW OF SYSTEMS (ROS)  Depression:      []  Denies all symptoms of depression [x] Depressed mood       [x] Insomnia/hypersomnia              [x] Fatigue        [x] Change in appetite     [x] Anhedonia                                [x] Difficulty concentrating      [x] Hopelessness             [x] Worthlessness [x] Guilt/shame                [x] Psychomotor agitation/retardation   Mania:     [x] Denies all symptoms of mania [] Elevated mood           [] Irritability         [] Pressured speech         []  Grandiosity         []  Decreased need for sleep                                                 [] Increased energy          []  Increase in goal directed activity                                       [] Flight of ideas    []  Excessive involvement in high-risk behaviors                   []  Distractibility     Psychosis:     [x] Denies all symptoms of psychosis [] Paranoia         []  Auditory Hallucinations          [] Visual hallucinations         [] ELOC        [] IOR                [] Delusions   Suicide:    []  Denies SI/plan/intent []  Passive SI         [x]   Active SI          [x] Plan           []   Intent   Homicide:  [x]   Denies HI/plan/intent []  Passive HI         []  Active HI         [] Plan            [] Intent           [] Identified Target    Additional findings:      Musculoskeletal: No abnormal movements observed      Gait & Station: Normal      Pain Screening: Present - mild to moderate      Nutrition & Dental Concerns: Decrease in food intake and/or loss of appetite  RISK FORMULATION/ASSESSMENT  [place]-Suicide Severity Rating Scale (C-SSRS)  1) Have you wished you were dead or wished you could go to sleep and not wake up? Yes 2) Have you actually had any thoughts about killing yourself? Yes 3) Have you been thinking about how you might do this? Yes, drowning, cut wrist, walking into traffic  4) Have you had these thoughts and had some intention of acting on them?  No but reported fears moving toward intent  5) Have you started to work out or worked out the details of how to kill yourself? Did you  intend to carry out this plan? No 6) Have you done anything, started to do anything, or prepared to do anything to end your life? No    Is the patient experiencing any suicidal or homicidal ideations:     [x]   yes        Protective factors considered for safety management:   Absence of psychosis Access to adequate health care Advice& help seeking Resourcefulness/Survival skills Children Sense of responsibility    Risk factors/concerns considered for safety management:  [] Prior attempt                                      [x] Hopelessness  [] Family history of suicide                    [x] Impulsivity [x] Depression                                         [] Aggression [x] Substance abuse/dependence          [x] Isolation [x] Physical illness/chronic pain              [] Barriers to accessing treatment [x] Recent loss                                        [] Unwillingness to seek help [x] Access to lethal means                      [x] Male gender [x] Age  over 27                                        [x] Unmarried   Is there a safety management plan with the patient and treatment team to minimize risk factors and promote protective factors:     [x] YES          []  NO  Explain: safety obs; CIWA monitoring; admit to inpt psych  Is crisis care placement or psychiatric hospitalization recommended:  [x] YES    [] NO  Based on my current evaluation and risk assessment, patient is determined at this time to be ju:Yphy risk  Global Suicide Risk Assessment: The Patient is found to be at Low/Moderate risk of suicide or violence; however, risk lethality increased under context of drugs/alcohol. Encouraged to abstain  *RISK ASSESSMENT Risk assessment is a dynamic process; it is possible that this patient's condition, and risk level, may change. This should be re-evaluated and managed over time as appropriate. Please re-consult psychiatric consult services if additional assistance is needed in terms of risk assessment and management. If your team decides to discharge this patient, please advise the patient how to best access emergency psychiatric services, or to call 911, if their condition worsens or they feel unsafe in any way.    Total time spent in this encounter was 60 minutes with greater than 50% of time spent in counseling and coordination of care.     Dr. Norina JUDITHANN Ada, PhD, MSN, APRN, PMHNP-BC, [facility] Arlee Bossard  KANDICE Ada, NP Tele[facility] Consult Services

## 2023-12-23 NOTE — Consult Note (Cosign Needed Addendum)
 [facility] Consult Note  Patient Name: Matthew [name] MRN: [id number] DOB: [birth year] DATE OF Consult: 12/23/2023  PRIMARY PSYCHIATRIC DIAGNOSES  1.  MDD, recurrent, severe without psychosis 2.  Alcohol Use D/O, severe 3.  SI   RECOMMENDATIONS  Inpt psych admission recommended:    [x] YES       []  NO   If yes:       [x]   Pt meets involuntary commitment criteria if not voluntary       []    Pt does not meet involuntary commitment criteria and must be         voluntary. If patient is not voluntary, then discharge is recommended.   Medication recommendations:  re-initiation of escitalopram  10mg  po daily for mood;   re-initiation of hydroxyzine  10mg  po three times daily as needed for anxiety; re-initiation of trazodone  100mg  po bedtime as needed for sleep   Recommend repeat LFTs to ensure trending down; then consideration of naltrexone 50mg  po daily for alcohol cravings   Non-Medication recommendations:  Monitor CIWA, reported hx of ETOH withdrawal seizures;  recommend once mood stable to see if candidate for  discharge to [facility] again.  He was d/c to there 07/2022.    Communication: Treatment team members (and family members if applicable) who were involved in treatment/care discussions and planning, and with whom we spoke or engaged with via secure text/chat, include the following: Epic Chat Dr. Cyrena Nurse Andrea  Discussed concern for reported headache; potential hypertensive related as noted elevated B/P documented earlier today;  pt with previous dx of HTN hx of  amlodipine ;  hx of ETOH WD sz   I have discussed my assessment and treatment recommendations with the patient. Possible medication side effects/risks/benefits of current regimen.   Importance of medication adherence for medication to be beneficial.   Follow-Up Telepsychiatry C/L services:            []  We will continue to follow this patient with you.             [x]  Will sign off for now. Please re-consult our service as  necessary.  Thank you for involving us  in the care of this patient. If you have any additional questions or concerns, please call [phone] and ask for me or the provider on-call.  TELEPSYCHIATRY ATTESTATION & CONSENT  As the provider for this telehealth consult, I attest that I verified the patient's identity using two separate identifiers, introduced myself to the patient, provided my credentials, disclosed my location, and performed this encounter via a HIPAA-compliant, real-time, face-to-face, two-way, interactive audio and video platform and with the full consent and agreement of the patient (or guardian as applicable.)  Patient physical location: [facility] Tele[facility] provider physical location: home office in state of [place]  Video start time: 22:07 pm  (Central Time) Video end time: 22:30pm  (Central Time)  IDENTIFYING DATA  Matthew [name] is a 63 y.o. year-old male for whom a psychiatric consultation has been ordered by the primary provider. The patient was identified using two separate identifiers.  CHIEF COMPLAINT/REASON FOR CONSULT  I just need to get my life back together, I need help or I am not going to live  HISTORY OF PRESENT ILLNESS (HPI)  The patient presents to ED with worsening depression and SI after recent relapse on alcohol  Hx of treatment for MDD, Alcohol Use  He is not currently taking psychotropic medications  Reports I've lost everything, ex-wife, job, car.  Reports has become sober  and I started back, when I started back is when bad things started happening;  Reports worsening in past 3 weeks. Was at [facility] since Dec; relapsed one month ago; prior to that was at a residential tx center for 9 months; this has been his longest sobriety period.      Today, client  reports symptoms of depression with anergia, anhedonia, amotivation, feels helpless, hopeless, worthless, guilt over relapse; denied  anxiety or worry,  no reported panic symptoms, no reported  obsessive/compulsive behaviors. Client reports SI with plans drowning, cutting wrist, walking into traffic, stated he fears moving toward intent.   There is no evidence of psychosis or delusional thinking.  Denieve AVH;  Client denied past episodes of hypomania, hyperactivity, erratic/excessive spending, involvement in dangerous activities, self-inflated ego, grandiosity, or promiscuity.  sleeping 4-5hrs/24hrs, appetite today was first time ate since Wed, no appetite , concentration decreased No self-harm behaviors. Reviewed active medication list/reviewed labs. Obtained Collateral information from medical record.  EKG QtC 434  PAST PSYCHIATRIC HISTORY    Previous Psychiatric Hospitalizations: 1-2 times last here 07/2022  Previous Detox/Residential treatments:  [facility] and [facility] Outpt treatment:  none; was going to AA; stopped going about 2 weeks ago; no sponsor Previous psychotropic medication trials: hydroxyzine , escitalopram  trazodone  Previous mental health diagnosis per client/MEDICAL RECORD NUMBERMDD, Alcohol Use disorder  Suicide attempts/self-injurious behaviors:  denied history of suicidal/homicidal ideation/gestures; denied history of self-harm behaviors  History of trauma/abuse/neglect/exploitation:  hx witness fighting   PAST MEDICAL HISTORY  Past Medical History:  Diagnosis Date   Hypertension    Pneumothorax 12/15/2014   Substance abuse (HCC)    alcohol     HOME MEDICATIONS  Facility Ordered Medications  Medication   LORazepam  (ATIVAN ) tablet 1 mg   Or   LORazepam  (ATIVAN ) tablet 0-4 mg   [START ON 12/25/2023] LORazepam  (ATIVAN ) tablet 1 mg   Or   [START ON 12/25/2023] LORazepam  (ATIVAN ) tablet 0-4 mg   thiamine  (VITAMIN B1) tablet 100 mg   Or   thiamine  (VITAMIN B1) injection 100 mg   PTA Medications  Medication Sig   amLODipine  (NORVASC ) 10 MG tablet Take 1 tablet (10 mg total) by mouth daily.   rosuvastatin  (CRESTOR ) 5 MG tablet Take 1 tablet (5 mg total) by mouth  daily.   escitalopram  (LEXAPRO ) 10 MG tablet Take 1 tablet (10 mg total) by mouth daily.    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 Tobacco products  Y  amount: 1ppd b. ETOH  Y  last drink  yesterday 12 pack Hx of cannabis and crack; none since 90's   Any history of substance related:  Blackouts:  +    Tremors: +  -  DUI: +  previous 3; 2016 D/T's: -  seizures: +  UDS not available for this encounter  BAL 268       FAMILY HISTORY  Family History  Problem  Relation Age of Onset   Hypertension Mother    Other Father        unknown medical history   Diabetes Maternal Grandmother    Other Maternal Grandfather        unknown medical history   Other Paternal Grandmother        unknown medical history   Other Paternal Grandfather        unknown medical history   Family Psychiatric History (if known):   denied psychiatric illnesses, substance use or suicides  MENTAL STATUS EXAM (MSE)  Mental Status Exam: General Appearance: Disheveled  Orientation:  Full (Time, Place, and Person)  Memory:  Immediate;   Good Recent;   Good Remote;   Good  Concentration:  Concentration: Fair  Recall:  Good  Attention  Fair  Eye Contact:  Minimal  Speech:  Clear and Coherent  Language:  Good  Volume:  Decreased  Mood: depressed  Affect:  Depressed  Thought Process:  Goal Directed  Thought Content:  Rumination  Suicidal Thoughts:  Yes.  with intent/plan  Homicidal Thoughts:  No  Judgement:  Fair  Insight:  Fair  Psychomotor  Activity:  Decreased  Akathisia:  Negative  Fund of Knowledge:  Good    Assets:  Communication Skills Desire for Improvement  Cognition:  WNL  ADL's:  Intact  AIMS (if indicated):       VITALS  Blood pressure (!) 111/94, pulse 76, temperature 98.2 F (36.8 C), temperature source Oral, resp. rate 16, height 5' 11 (1.803 m), weight 77.1 kg, SpO2 98%.  LABS  Admission on 12/23/2023  Component Date Value Ref Range Status   Sodium 12/23/2023 129 (L)  135 - 145 mmol/L Final   Potassium 12/23/2023 4.3  3.5 - 5.1 mmol/L Final   Chloride 12/23/2023 92 (L)  98 - 111 mmol/L Final   CO2 12/23/2023 23  22 - 32 mmol/L Final   Glucose, Bld 12/23/2023 74  70 - 99 mg/dL Final   Glucose reference range applies only to samples taken after fasting for at least 8 hours.   BUN 12/23/2023 14  8 - 23 mg/dL Final   Creatinine, Ser 12/23/2023 1.01  0.61 - 1.24 mg/dL Final   Calcium  12/23/2023 8.8 (L)  8.9 - 10.3 mg/dL Final   Total Protein 91/90/7974 7.4  6.5 - 8.1 g/dL Final   Albumin 91/90/7974 4.1  3.5 - 5.0 g/dL Final   AST 91/90/7974 160 (H)  15 - 41 U/L Final   ALT 12/23/2023 51 (H)  0 - 44 U/L Final   Alkaline Phosphatase 12/23/2023 93  38 - 126 U/L Final   Total Bilirubin 12/23/2023 2.8 (H)  0.0 - 1.2 mg/dL Final   GFR, Estimated 12/23/2023 >60  >60 mL/min Final   Comment: (NOTE) Calculated using the CKD-EPI Creatinine Equation (2021)    Anion gap 12/23/2023 14  5 - 15 Final   Performed at [facility], [address] [place], [place] [address]   Alcohol, Ethyl (B) 12/23/2023 268 (H)  <15 mg/dL Final   Comment: (NOTE) For medical purposes only. Performed at [facility], [address] [place], [place] [address]    WBC 12/23/2023 6.8  4.0 - 10.5 K/uL Final   RBC 12/23/2023 4.78  4.22 - 5.81 MIL/uL Final   Hemoglobin 12/23/2023 15.3  13.0 - 17.0 g/dL Final   HCT 91/90/7974 45.4  39.0 - 52.0 % Final   MCV 12/23/2023 95.0  80.0 - 100.0 fL Final  MCH 12/23/2023 32.0   26.0 - 34.0 pg Final   MCHC 12/23/2023 33.7  30.0 - 36.0 g/dL Final   RDW 91/90/7974 11.9  11.5 - 15.5 % Final   Platelets 12/23/2023 252  150 - 400 K/uL Final   nRBC 12/23/2023 0.0  0.0 - 0.2 % Final   Neutrophils Relative % 12/23/2023 71  % Final   Neutro Abs 12/23/2023 4.9  1.7 - 7.7 K/uL Final   Lymphocytes Relative 12/23/2023 22  % Final   Lymphs Abs 12/23/2023 1.5  0.7 - 4.0 K/uL Final   Monocytes Relative 12/23/2023 6  % Final   Monocytes Absolute 12/23/2023 0.4  0.1 - 1.0 K/uL Final   Eosinophils Relative 12/23/2023 0  % Final   Eosinophils Absolute 12/23/2023 0.0  0.0 - 0.5 K/uL Final   Basophils Relative 12/23/2023 1  % Final   Basophils Absolute 12/23/2023 0.0  0.0 - 0.1 K/uL Final   Immature Granulocytes 12/23/2023 0  % Final   Abs Immature Granulocytes 12/23/2023 0.03  0.00 - 0.07 K/uL Final   Performed at [facility], [address] [place], [place] [address]    PSYCHIATRIC REVIEW OF SYSTEMS (ROS)  Depression:      []  Denies all symptoms of depression [x] Depressed mood       [x] Insomnia/hypersomnia              [x] Fatigue        [x] Change in appetite     [x] Anhedonia                                [x] Difficulty concentrating      [x] Hopelessness             [x] Worthlessness [x] Guilt/shame                [x] Psychomotor agitation/retardation   Mania:     [x] Denies all symptoms of mania [] Elevated mood           [] Irritability         [] Pressured speech         []  Grandiosity         []  Decreased need for sleep                                                 [] Increased energy          []  Increase in goal directed activity                                       [] Flight of ideas    []  Excessive involvement in high-risk behaviors                   []  Distractibility     Psychosis:     [x] Denies all symptoms of psychosis [] Paranoia         []  Auditory Hallucinations          [] Visual hallucinations         [] ELOC        [] IOR                [] Delusions    Suicide:    []  Denies SI/plan/intent []  Passive  SI         [x]   Active SI         [x] Plan           [] Intent   Homicide:  [x]   Denies HI/plan/intent []  Passive HI         []  Active HI         [] Plan            [] Intent           [] Identified Target    Additional findings:      Musculoskeletal: No abnormal movements observed      Gait & Station: Normal      Pain Screening: Present - mild to moderate      Nutrition & Dental Concerns: Decrease in food intake and/or loss of appetite  RISK FORMULATION/ASSESSMENT  [place]-Suicide Severity Rating Scale (C-SSRS)  1) Have you wished you were dead or wished you could go to sleep and not wake up? Yes 2) Have you actually had any thoughts about killing yourself? Yes 3) Have you been thinking about how you might do this? Yes, drowning, cut wrist, walking into traffic  4) Have you had these thoughts and had some intention of acting on them?  No but reported fears moving toward intent  5) Have you started to work out or worked out the details of how to kill yourself? Did you  intend to carry out this plan? No 6) Have you done anything, started to do anything, or prepared to do anything to end your life? No    Is the patient experiencing any suicidal or homicidal ideations:     [x]   yes        Protective factors considered for safety management:   Absence of psychosis Access to adequate health care Advice& help seeking Resourcefulness/Survival skills Children Sense of responsibility    Risk factors/concerns considered for safety management:  [] Prior attempt                                      [x] Hopelessness  [] Family history of suicide                    [x] Impulsivity [x] Depression                                         [] Aggression [x] Substance abuse/dependence          [x] Isolation [x] Physical illness/chronic pain              [] Barriers to accessing treatment [x] Recent loss                                        [] Unwillingness to  seek help [x] Access to lethal means                      [x] Male gender [x] Age over 18                                        [x] Unmarried   Is there a safety management plan with the patient and treatment team to  minimize risk factors and promote protective factors:     [x] YES          []  NO            Explain: safety obs; CIWA monitoring; admit to inpt psych  Is crisis care placement or psychiatric hospitalization recommended:  [x] YES    [] NO  Based on my current evaluation and risk assessment, patient is determined at this time to be ju:Yphy risk  Global Suicide Risk Assessment: The Patient is found to be at Low/Moderate risk of suicide or violence; however, risk lethality increased under context of drugs/alcohol. Encouraged to abstain  *RISK ASSESSMENT Risk assessment is a dynamic process; it is possible that this patient's condition, and risk level, may change. This should be re-evaluated and managed over time as appropriate. Please re-consult psychiatric consult services if additional assistance is needed in terms of risk assessment and management. If your team decides to discharge this patient, please advise the patient how to best access emergency psychiatric services, or to call 911, if their condition worsens or they feel unsafe in any way.    Total time spent in this encounter was 60 minutes with greater than 50% of time spent in counseling and coordination of care.     Dr. Paulino JUDITHANN Ada, PhD, MSN, APRN, PMHNP-BC, [facility] Tyshawn Ciullo  KANDICE Ada, NP Telepsychiatry Consult Services

## 2023-12-23 NOTE — BH Assessment (Signed)
 Comprehensive Clinical Assessment (CCA) Note  12/23/2023 Matthew [name] [id number]  Matthew [name] is a 63yo male who presents to [facility] ED endorsing suicidal ideation. Pt reports he is currently homeless after a recent relapse (last week). Pt was sober for 15 months and was evicted from the [employer] (sober living environment) after he relapsed last week. Pt states he encountered a series of events that triggered this relapse and this has caused him to feel depressed, hopeless, and worthless. Pt denied having a specific plan; however, he is adamant that he no longer desires to live due to the immense amount of shame and guilt. Pt denied HI/AVH. Pt avoided eye contact throughout assessment in a slumped position. Pt denied use of other substances. Pt reports decreased sleep and poor appetite.  Chief Complaint:  Chief Complaint  Patient presents with   Psychiatric Evaluation   Visit Diagnosis:  Substance Induce Depressive Disorder  Alcohol Use Disorder, severe   CCA Screening, Triage and Referral (STR)  Patient Reported Information How did you hear about us ? Self  Referral name: No data recorded Referral phone number: No data recorded  Whom do you see for routine medical problems? No data recorded Practice/Facility Name: No data recorded Practice/Facility Phone Number: No data recorded Name of Contact: No data recorded Contact Number: No data recorded Contact Fax Number: No data recorded Prescriber Name: No data recorded Prescriber Address (if known): No data recorded  What Is the Reason for Your Visit/Call Today? Pt reports  I am in a bad place, I don't want to go home RN asked pt if any thoughts of harming himself pt replies yes denies HI denies visual or auditory hallucinations. Pt quiet in triage does not elaborate what he is feeling. RN asked pt if any plan to hurt himself I can't think of one   How Long Has This Been Causing You Problems? 1 wk - 1 month  What Do You Feel  Would Help You the Most Today? Stress Management; Alcohol or Drug Use Treatment; Treatment for Depression or other mood problem; Social Support; Patent examiner; Food Assistance   Have You Recently Been in Any Inpatient Treatment (Hospital/Detox/Crisis Center/28-Day Program)? No data recorded Name/Location of Program/Hospital:No data recorded How Long Were You There? No data recorded When Were You Discharged? No data recorded  Have You Ever Received Services From [facility] Before? No data recorded Who Do You See at [facility]? No data recorded  Have You Recently Had Any Thoughts About Hurting Yourself? Yes  Are You Planning to Commit Suicide/Harm Yourself At This time? No   Have you Recently Had Thoughts About Hurting Someone Sherral? No  Explanation: No data recorded  Have You Used Any Alcohol or Drugs in the Past 24 Hours? Yes  How Long Ago Did You Use Drugs or Alcohol? No data recorded What Did You Use and How Much? Alcohol - 3 / 12oz beers   Do You Currently Have a Therapist/Psychiatrist? No  Name of Therapist/Psychiatrist: No data recorded  Have You Been Recently Discharged From Any Office Practice or Programs? Yes  Explanation of Discharge From Practice/Program: Pt was recently discharged from an [facility] due to relapsing on alcohol     CCA Screening Triage Referral Assessment Type of Contact: Face-to-Face  Is this Initial or Reassessment? No data recorded Date Telepsych consult ordered in [facility]:  No data recorded Time Telepsych consult ordered in [facility]:  No data recorded  Patient Reported Information Reviewed? No data recorded Patient Left Without  Being Seen? No data recorded Reason for Not Completing Assessment: No data recorded  Collateral Involvement: None   Does Patient Have a Court Appointed Legal Guardian? No data recorded Name and Contact of Legal Guardian: No data recorded If Minor and Not Living with Parent(s), Who has Custody? No data  recorded Is CPS involved or ever been involved? Never  Is APS involved or ever been involved? Never   Patient Determined To Be At Risk for Harm To Self or Others Based on Review of Patient Reported Information or Presenting Complaint? Yes, for Self-Harm  Method: No Plan  Availability of Means: No access or NA  Intent: Vague intent or NA  Notification Required: No need or identified person  Additional Information for Danger to Others Potential: No data recorded Additional Comments for Danger to Others Potential: No data recorded Are There Guns or Other Weapons in Your Home? No  Types of Guns/Weapons: No data recorded Are These Weapons Safely Secured?                            No data recorded Who Could Verify You Are Able To Have These Secured: No data recorded Do You Have any Outstanding Charges, Pending Court Dates, Parole/Probation? No data recorded Contacted To Inform of Risk of Harm To Self or Others: No data recorded  Location of Assessment: [facility] ED   Does Patient Present under Involuntary Commitment? No  IVC Papers Initial File Date: No data recorded  [place] of Residence: [place]   Patient Currently Receiving the Following Services: Not Receiving Services   Determination of Need: Emergent (2 hours)   Options For Referral: Medication Management; Inpatient Hospitalization     CCA Biopsychosocial Intake/Chief Complaint:  No data recorded Current Symptoms/Problems: No data recorded  Patient Reported Schizophrenia/Schizoaffective Diagnosis in Past: No   Strengths: Patient is able to communicate his needs.  Preferences: No data recorded Abilities: No data recorded  Type of Services Patient Feels are Needed: No data recorded  Initial Clinical Notes/Concerns: No data recorded  Mental Health Symptoms Depression:  No data recorded  Duration of Depressive symptoms: No data recorded  Mania:  No data recorded  Anxiety:   No data recorded  Psychosis:  No  data recorded  Duration of Psychotic symptoms: No data recorded  Trauma:  No data recorded  Obsessions:  No data recorded  Compulsions:  No data recorded  Inattention:  No data recorded  Hyperactivity/Impulsivity:  No data recorded  Oppositional/Defiant Behaviors:  No data recorded  Emotional Irregularity:  No data recorded  Other Mood/Personality Symptoms:  No data recorded   Mental Status Exam Appearance and self-care  Stature:  Average   Weight:  Thin   Clothing:  -- (Hospital scrubs)   Grooming:  Normal   Cosmetic use:  None   Posture/gait:  Slumped   Motor activity:  Slowed   Sensorium  Attention:  Normal   Concentration:  Normal   Orientation:  X5   Recall/memory:  Normal   Affect and Mood  Affect:  Depressed; Flat   Mood:  Depressed; Hopeless; Worthless   Relating  Eye contact:  None   Facial expression:  Depressed; Sad   Attitude toward examiner:  Cooperative   Thought and Language  Speech flow: Clear and Coherent   Thought content:  Appropriate to Mood and Circumstances   Preoccupation:  None   Hallucinations:  None   Organization:  No data recorded  [employer]  Fund of Knowledge:  Average   Intelligence:  Average   Abstraction:  Normal   Judgement:  Impaired   Reality Testing:  Realistic   Insight:  Flashes of insight   Decision Making:  Normal   Social Functioning  Social Maturity:  Isolates   Social Judgement:  Normal   Stress  Stressors:  Transitions; Grief/losses   Coping Ability:  Contractor Deficits:  Self-control; Decision making   Supports:  Support needed     Religion: Religion/Spirituality Are You A Religious Person?: No  Leisure/Recreation: Leisure / Recreation Do You Have Hobbies?: No  Exercise/Diet: Exercise/Diet Do You Exercise?: No Have You Gained or Lost A Significant Amount of Weight in the Past Six Months?: No Do You Follow a Special Diet?: No Do You Have Any Trouble  Sleeping?: Yes Explanation of Sleeping Difficulties: Pt reports he has been drinking to induce sleep   CCA Employment/Education Employment/Work Situation: Employment / Work Situation Employment Situation: Unemployed Patient's Job has Been Impacted by Current Illness: No Has Patient ever Been in Equities trader?: No  Education: Education Is Patient Currently Attending School?: No Did Theme park manager?: No Did You Have An Individualized Education Program (IIEP): No Did You Have Any Difficulty At [employer]?: No Patient's Education Has Been Impacted by Current Illness: No   CCA Family/Childhood History Family and Relationship History: Family history Marital status: Single Does patient have children?: Yes How many children?: 3 How is patient's relationship with their children?: I talk to them every now and then and it's pretty decent  Childhood History:  Childhood History By whom was/is the patient raised?: Grandparents Did patient suffer any verbal/emotional/physical/sexual abuse as a child?: No Did patient suffer from severe childhood neglect?: No Has patient ever been sexually abused/assaulted/raped as an adolescent or adult?: No Was the patient ever a victim of a crime or a disaster?: No Witnessed domestic violence?: Yes Has patient been affected by domestic violence as an adult?: No Description of domestic violence: I've seen people fight  Child/Adolescent Assessment:     CCA Substance Use Alcohol/Drug Use: Alcohol / Drug Use Pain Medications: See MAR Prescriptions: See MAR Over the Counter: See MAR History of alcohol / drug use?: Yes Longest period of sobriety (when/how long): 6 months Negative Consequences of Use: Financial, Legal, Personal relationships, Work / School Withdrawal Symptoms: Tremors, Change in blood pressure, Nausea / Vomiting Substance #1 Name of Substance 1: Alcohol 1 - Age of First Use: UKN 1 - Amount (size/oz): 3 - 12oz beers 1 -  Frequency: Daily 1 - Duration: For the past week 1 - Last Use / Amount: 3 - 12oz beers 1 - Method of Aquiring: Money 1- Route of Use: Oral     ASAM's:  Six Dimensions of Multidimensional Assessment  Dimension 1:  Acute Intoxication and/or Withdrawal Potential:   Dimension 1:  Description of individual's past and current experiences of substance use and withdrawal: Hx of withdrawals  Dimension 2:  Biomedical Conditions and Complications:   Dimension 2:  Description of patient's biomedical conditions and  complications: None Reported  Dimension 3:  Emotional, Behavioral, or Cognitive Conditions and Complications:  Dimension 3:  Description of emotional, behavioral, or cognitive conditions and complications: Depression  Dimension 4:  Readiness to Change:  Dimension 4:  Description of Readiness to Change criteria: Contemplation stage of change  Dimension 5:  Relapse, Continued use, or Continued Problem Potential:  Dimension 5:  Relapse, continued use, or continued problem potential critiera description: High risk  for relapse  Dimension 6:  Recovery/Living Environment:  Dimension 6:  Recovery/Iiving environment criteria description: Homeless  ASAM Severity Score: ASAM's Severity Rating Score: 16  ASAM Recommended Level of Treatment: ASAM Recommended Level of Treatment: Level III Residential Treatment   Substance use Disorder (SUD) Substance Use Disorder (SUD)  Checklist Symptoms of Substance Use: Continued use despite having a persistent/recurrent physical/psychological problem caused/exacerbated by use, Continued use despite persistent or recurrent social, interpersonal problems, caused or exacerbated by use, Evidence of tolerance, Evidence of withdrawal (Comment), Large amounts of time spent to obtain, use or recover from the substance(s), Persistent desire or unsuccessful efforts to cut down or control use, Presence of craving or strong urge to use, Recurrent use that results in a failure to  fulfill major role obligations (work, school, home), Repeated use in physically hazardous situations, Social, occupational, recreational activities given up or reduced due to use  Recommendations for Services/Supports/Treatments: Recommendations for Services/Supports/Treatments Recommendations For Services/Supports/Treatments: Detox  DSM5 Diagnoses: Patient Active Problem List   Diagnosis Date Noted   Elevated lipids 11/09/2022   Adenomatous polyp of colon 10/28/2022   Screen for colon cancer 10/13/2022   MDD (major depressive disorder) 07/20/2022   Seizure (HCC) 07/28/2021   Macrocytic anemia 07/28/2021   Aftercare 02/11/2015   Pain and swelling of right elbow 01/07/2015   Pulmonary infiltrate    Contusion of right shoulder    Eczema 10/30/2014   Alcohol use disorder, severe, dependence (HCC) 10/24/2014   Tobacco use disorder 10/24/2014   HTN (hypertension), benign 10/24/2014   Major depressive disorder, single episode, severe without psychotic features (HCC) 10/24/2014     Magdalen Elouise KEN HENRI, PM[facility]-C Therapeutic Triage Specialist Phone: [phone]   MAGDALEN LITTIE Elouise, Counselor

## 2023-12-23 NOTE — ED Notes (Signed)
 Pt on hospital scrubs, pt put all his clothes in his personal bag which is a color black and zipped it. A pair of white shoes is in a plastic belonging bag

## 2023-12-24 ENCOUNTER — Encounter: Payer: Self-pay | Admitting: Psychiatry

## 2023-12-24 ENCOUNTER — Inpatient Hospital Stay
Admission: AD | Admit: 2023-12-24 | Discharge: 2023-12-30 | DRG: 885 | Disposition: A | Discharge status: Home / Self Care | Payer: 59 | Source: Intra-hospital | Attending: Psychiatry | Admitting: Psychiatry

## 2023-12-24 DIAGNOSIS — F10239 Alcohol dependence with withdrawal, unspecified: Secondary | ICD-10-CM | POA: Diagnosis present

## 2023-12-24 DIAGNOSIS — Z8249 Family history of ischemic heart disease and other diseases of the circulatory system: Secondary | ICD-10-CM

## 2023-12-24 DIAGNOSIS — F419 Anxiety disorder, unspecified: Secondary | ICD-10-CM | POA: Diagnosis present

## 2023-12-24 DIAGNOSIS — Z79899 Other long term (current) drug therapy: Secondary | ICD-10-CM | POA: Diagnosis not present

## 2023-12-24 DIAGNOSIS — Z9151 Personal history of suicidal behavior: Secondary | ICD-10-CM

## 2023-12-24 DIAGNOSIS — Z604 Social exclusion and rejection: Secondary | ICD-10-CM | POA: Diagnosis present

## 2023-12-24 DIAGNOSIS — Z5982 Transportation insecurity: Secondary | ICD-10-CM | POA: Diagnosis not present

## 2023-12-24 DIAGNOSIS — R45851 Suicidal ideations: Secondary | ICD-10-CM

## 2023-12-24 DIAGNOSIS — Z5901 Sheltered homelessness: Secondary | ICD-10-CM

## 2023-12-24 DIAGNOSIS — F17210 Nicotine dependence, cigarettes, uncomplicated: Secondary | ICD-10-CM | POA: Diagnosis present

## 2023-12-24 DIAGNOSIS — F331 Major depressive disorder, recurrent, moderate: Secondary | ICD-10-CM | POA: Diagnosis present

## 2023-12-24 DIAGNOSIS — Z833 Family history of diabetes mellitus: Secondary | ICD-10-CM

## 2023-12-24 DIAGNOSIS — I10 Essential (primary) hypertension: Secondary | ICD-10-CM | POA: Diagnosis present

## 2023-12-24 LAB — URINE DRUG SCREEN, QUALITATIVE (ARMC ONLY)
Amphetamines, Ur Screen: NOT DETECTED
Barbiturates, Ur Screen: NOT DETECTED
Benzodiazepine, Ur Scrn: NOT DETECTED
Cannabinoid 50 Ng, Ur ~~LOC~~: NOT DETECTED
Cocaine Metabolite,Ur ~~LOC~~: NOT DETECTED
MDMA (Ecstasy)Ur Screen: NOT DETECTED
Methadone Scn, Ur: NOT DETECTED
Opiate, Ur Screen: NOT DETECTED
Phencyclidine (PCP) Ur S: NOT DETECTED
Tricyclic, Ur Screen: NOT DETECTED

## 2023-12-24 LAB — FOLATE: Folate: 17.4 ng/mL (ref >5.9)

## 2023-12-24 MED ORDER — LORAZEPAM 1 MG PO TABS: 1.0000 mg | ORAL_TABLET | Freq: Four times a day (QID) | ORAL | Status: AC | PRN

## 2023-12-24 MED ORDER — DIPHENHYDRAMINE HCL 50 MG/ML IJ SOLN: 50.0000 mg | Freq: Three times a day (TID) | INTRAMUSCULAR | Status: DC | PRN

## 2023-12-24 MED ORDER — LORAZEPAM 1 MG PO TABS
1.0000 mg | ORAL_TABLET | Freq: Every day | ORAL | Status: AC
  Administered 2023-12-28: 1 mg via ORAL
  Filled 2023-12-24: qty 1

## 2023-12-24 MED ORDER — ESCITALOPRAM OXALATE 10 MG PO TABS
10.0000 mg | ORAL_TABLET | Freq: Every day | ORAL | Status: DC
  Administered 2023-12-24 – 2023-12-30 (×10): 10 mg via ORAL
  Filled 2023-12-24 (×7): qty 1

## 2023-12-24 MED ORDER — LORAZEPAM 1 MG PO TABS
1.0000 mg | ORAL_TABLET | Freq: Four times a day (QID) | ORAL | Status: AC
  Administered 2023-12-24 – 2023-12-25 (×8): 1 mg via ORAL
  Filled 2023-12-24 (×6): qty 1

## 2023-12-24 MED ORDER — ONDANSETRON 4 MG PO TBDP: 4.0000 mg | ORAL_TABLET | Freq: Four times a day (QID) | ORAL | Status: AC | PRN

## 2023-12-24 MED ORDER — ESCITALOPRAM OXALATE 10 MG PO TABS: 10.0000 mg | ORAL_TABLET | Freq: Every day | ORAL | Status: DC

## 2023-12-24 MED ORDER — TRAZODONE HCL 100 MG PO TABS: 100.0000 mg | ORAL_TABLET | Freq: Every evening | ORAL | Status: DC | PRN

## 2023-12-24 MED ORDER — HALOPERIDOL 5 MG PO TABS: 5.0000 mg | ORAL_TABLET | Freq: Three times a day (TID) | ORAL | Status: DC | PRN

## 2023-12-24 MED ORDER — ADULT MULTIVITAMIN W/MINERALS CH
1.0000 | ORAL_TABLET | Freq: Every day | ORAL | Status: DC
  Administered 2023-12-24 – 2023-12-30 (×10): 1 via ORAL
  Filled 2023-12-24 (×7): qty 1

## 2023-12-24 MED ORDER — THIAMINE HCL 100 MG/ML IJ SOLN
100.0000 mg | Freq: Once | INTRAMUSCULAR | Status: AC
  Administered 2023-12-24: 100 mg via INTRAMUSCULAR
  Filled 2023-12-24: qty 2

## 2023-12-24 MED ORDER — HALOPERIDOL LACTATE 5 MG/ML IJ SOLN: 5.0000 mg | Freq: Three times a day (TID) | INTRAMUSCULAR | Status: DC | PRN

## 2023-12-24 MED ORDER — ALUM & MAG HYDROXIDE-SIMETH 200-200-20 MG/5ML PO SUSP: 30.0000 mL | ORAL | Status: DC | PRN

## 2023-12-24 MED ORDER — TRAZODONE HCL 100 MG PO TABS
100.0000 mg | ORAL_TABLET | Freq: Every day | ORAL | Status: DC
  Administered 2023-12-24 – 2023-12-25 (×3): 100 mg via ORAL
  Filled 2023-12-24 (×2): qty 1

## 2023-12-24 MED ORDER — LORAZEPAM 1 MG PO TABS
1.0000 mg | ORAL_TABLET | Freq: Two times a day (BID) | ORAL | Status: AC
  Administered 2023-12-26 – 2023-12-27 (×4): 1 mg via ORAL
  Filled 2023-12-24 (×2): qty 1

## 2023-12-24 MED ORDER — DIPHENHYDRAMINE HCL 25 MG PO CAPS: 50.0000 mg | ORAL_CAPSULE | Freq: Three times a day (TID) | ORAL | Status: DC | PRN

## 2023-12-24 MED ORDER — HYDROXYZINE HCL 25 MG PO TABS
25.0000 mg | ORAL_TABLET | Freq: Four times a day (QID) | ORAL | Status: AC | PRN
  Administered 2023-12-26 (×2): 25 mg via ORAL
  Filled 2023-12-24 (×3): qty 1

## 2023-12-24 MED ORDER — LORAZEPAM 2 MG/ML IJ SOLN: 2.0000 mg | Freq: Three times a day (TID) | INTRAMUSCULAR | Status: DC | PRN

## 2023-12-24 MED ORDER — NICOTINE 21 MG/24HR TD PT24
21.0000 mg | MEDICATED_PATCH | Freq: Every day | TRANSDERMAL | Status: DC
  Administered 2023-12-28: 21 mg via TRANSDERMAL
  Filled 2023-12-24 (×5): qty 1

## 2023-12-24 MED ORDER — HALOPERIDOL LACTATE 5 MG/ML IJ SOLN: 10.0000 mg | Freq: Three times a day (TID) | INTRAMUSCULAR | Status: DC | PRN

## 2023-12-24 MED ORDER — THIAMINE MONONITRATE 100 MG PO TABS
100.0000 mg | ORAL_TABLET | Freq: Every day | ORAL | Status: DC
  Administered 2023-12-25 – 2023-12-30 (×9): 100 mg via ORAL
  Filled 2023-12-24 (×6): qty 1

## 2023-12-24 MED ORDER — ACETAMINOPHEN 325 MG PO TABS
650.0000 mg | ORAL_TABLET | Freq: Four times a day (QID) | ORAL | Status: DC | PRN
  Administered 2023-12-26 (×2): 650 mg via ORAL
  Filled 2023-12-24: qty 2

## 2023-12-24 MED ORDER — MAGNESIUM HYDROXIDE 400 MG/5ML PO SUSP: 30.0000 mL | Freq: Every day | ORAL | Status: DC | PRN

## 2023-12-24 MED ORDER — HYDROXYZINE HCL 10 MG PO TABS: 10.0000 mg | ORAL_TABLET | Freq: Three times a day (TID) | ORAL | Status: DC | PRN

## 2023-12-24 MED ORDER — NICOTINE POLACRILEX 2 MG MT GUM: 2.0000 mg | CHEWING_GUM | OROMUCOSAL | Status: DC | PRN

## 2023-12-24 MED ORDER — ENSURE PLUS HIGH PROTEIN PO LIQD
237.0000 mL | Freq: Two times a day (BID) | ORAL | Status: DC
  Administered 2023-12-24 – 2023-12-27 (×7): 237 mL via ORAL

## 2023-12-24 MED ORDER — LOPERAMIDE HCL 2 MG PO CAPS: 2.0000 mg | ORAL_CAPSULE | ORAL | Status: AC | PRN

## 2023-12-24 MED ORDER — LORAZEPAM 1 MG PO TABS
1.0000 mg | ORAL_TABLET | Freq: Three times a day (TID) | ORAL | Status: AC
  Administered 2023-12-25 – 2023-12-26 (×6): 1 mg via ORAL
  Filled 2023-12-24 (×3): qty 1

## 2023-12-24 NOTE — Plan of Care (Signed)

## 2023-12-24 NOTE — BHH Counselor (Signed)
 Adult Comprehensive Assessment  Patient ID: Matthew [name], male   DOB: [birth year], 63 y.o.   MRN: [id number]  Information Source: Information source: Patient  Current Stressors:  Patient states their primary concerns and needs for treatment are:: depression Patient states their goals for this hospitilization and ongoing recovery are:: feel better Educational / Learning stressors: Pt denies. Employment / Job issues: Pt denies. Family Relationships: Kind of.  It's me. Financial / Lack of resources (include bankruptcy): nowhere to go Housing / Lack of housing: probably going to be homeless when I leave here Physical health (include injuries & life threatening diseases): high blood pressure Social relationships: Pt denies. Substance abuse: Alcohol Bereavement / Loss: Pt denies.  Living/Environment/Situation:  Living Arrangements: Other (Comment) Living conditions (as described by patient or guardian): WNL How long has patient lived in current situation?: I was staying a week at a hotel What is atmosphere in current home: Comfortable  Family History:  Marital status: Divorced Divorced, when?: 15 years Does patient have children?: Yes How many children?: 3 How is patient's relationship with their children?: good  Childhood History:  By whom was/is the patient raised?: Grandparents Description of patient's relationship with caregiver when they were a child: great Patient's description of current relationship with people who raised him/her: Pt reports that they are deceased. How were you disciplined when you got in trouble as a child/adolescent?: whoopings Does patient have siblings?: Yes Number of Siblings: 5 Description of patient's current relationship with siblings: great Did patient suffer any verbal/emotional/physical/sexual abuse as a child?: No Did patient suffer from severe childhood neglect?: No Has patient ever been sexually abused/assaulted/raped  as an adolescent or adult?: No Was the patient ever a victim of a crime or a disaster?: No Witnessed domestic violence?: No Has patient been affected by domestic violence as an adult?: No  Education:  Highest grade of school patient has completed: 11th Currently a student?: No Learning disability?: No  Employment/Work Situation:   Employment Situation: Unemployed What is the Longest Time Patient has Held a Job?: 20 years Where was the Patient Employed at that Time?: [employer] Has Patient ever Been in the [employer]?: No  Financial Resources:   Surveyor, quantity resources: No income Does patient have a Lawyer or guardian?: No  Alcohol/Substance Abuse:   What has been your use of drugs/alcohol within the last 12 months?: Alcohol: 12 pack a day If attempted suicide, did drugs/alcohol play a role in this?: No Alcohol/Substance Abuse Treatment Hx: Past Tx, Inpatient Has alcohol/substance abuse ever caused legal problems?: Yes (Pt reports that he has history of DUI's)  Social Support System:   Patient's Community Support System: None Describe Community Support System: Pt denies. Type of faith/religion: Pt denies. How does patient's faith help to cope with current illness?: Pt denies.  Leisure/Recreation:   Do You Have Hobbies?: No  Strengths/Needs:   What is the patient's perception of their strengths?: hardworker Patient states they can use these personal strengths during their treatment to contribute to their recovery: Pt denies. Patient states these barriers may affect/interfere with their treatment: Pt denies. Patient states these barriers may affect their return to the community: myself Other important information patient would like considered in planning for their treatment: Pt denies.  Discharge Plan:   Currently receiving community mental health services: No Patient states concerns and preferences for aftercare planning are: Pt reports that she is unsure of  what treatment requirements he would like. Patient states they will know when they are safe and  ready for discharge when: I feel better Does patient have access to transportation?: No Does patient have financial barriers related to discharge medications?: Yes Patient description of barriers related to discharge medications: Chart indicates that he does not have insurance. Plan for no access to transportation at discharge: CSW to assist with transportaiton needs. Plan for living situation after discharge: Pt reports that he is unsure of where he will go to at discharge. Will patient be returning to same living situation after discharge?: No  Summary/Recommendations:   Summary and Recommendations (to be completed by the evaluator): Patient is a 63 year old male from [place] [place] [facility]  Patient presents to the hospital for endorsing suicidal ideations.  Patient reports that his current mental health status has been triggered by his housing instability.  He reports that he was staying at a hotel, however, is unlikely that he will be able to return there at discharge.  Patient reports that he was staying at a hotel, however, initial assessment indicates that the patient reported that he had been staying at an [facility] but was asked to leave due to a relapse.  He reported at admission that prior to his relapse he had been sober for 15 months.  He reported during this assessment that he has been drinking a 12 pack of beer daily. He reports that he is unsure of treatment recommendations.  Recommendations include: Crisis stabilization, therapeutic milieu, encourage group attendance and participation, medication management for mood stabilization and development of comprehensive mental wellness plan.  Matthew [name]. 12/24/2023

## 2023-12-24 NOTE — Group Note (Signed)
 Date:  12/24/2023 Time:  4:58 PM  Group Topic/Focus:  Activity Group: The focus of the group is to promote activity for the patients and to encourage them to go outside to the courtyard for some fresh air and some exercise.    Participation Level:  Did Not Attend   Matthew [name] 12/24/2023, 4:58 PM

## 2023-12-24 NOTE — Progress Notes (Signed)
   12/24/23 0210  Psych Admission Type (Psych Patients Only)  Admission Status Voluntary  Psychosocial Assessment  Patient Complaints Anxiety;Depression  Eye Contact Avoids  Facial Expression Sullen;Sad;Worried  Affect Depressed  Speech Logical/coherent  Interaction Isolative;Minimal  Motor Activity Slow  Appearance/Hygiene Body odor  Behavior Characteristics Cooperative;Guarded  Mood Depressed  Aggressive Behavior  Effect No apparent injury  Thought Process  Coherency WDL  Content WDL  Delusions None reported or observed  Perception WDL  Hallucination None reported or observed  Judgment Poor  Confusion WDL  Danger to Self  Current suicidal ideation? Denies  Danger to Others  Danger to Others None reported or observed

## 2023-12-24 NOTE — BHH Suicide Risk Assessment (Signed)
 BHH INPATIENT:  Family/Significant Other Suicide Prevention Education  Suicide Prevention Education:  Patient Refusal for Family/Significant Other Suicide Prevention Education: The patient Matthew MOTTERN has refused to provide written consent for family/significant other to be provided Family/Significant Other Suicide Prevention Education during admission and/or prior to discharge.  Physician notified. SPE completed with pt, as pt refused to consent to family contact. SPI pamphlet provided to pt and pt was encouraged to share information with support network, ask questions, and talk about any concerns relating to SPE. Pt denies access to guns/firearms and verbalized understanding of information provided. Mobile Crisis information also provided to pt.    Sherryle JINNY Margo 12/24/2023, 3:29 PM

## 2023-12-24 NOTE — BH Assessment (Signed)
 Patient is to be admitted to [facility] .  Attending Physician will be Dr. Layne.   Patient has been assigned to room 314   Intake Paper Work has been signed and placed on patient chart.  ER staff is aware of the admission: [facility] ER Secretary   Dr. Cyrena, ER MD  Andrea Patient's Nurse

## 2023-12-24 NOTE — Group Note (Signed)
 Date:  12/24/2023 Time:  9:28 PM  Group Topic/Focus:  Wrap-Up Group:   The focus of this group is to help patients review their daily goal of treatment and discuss progress on daily workbooks.    Participation Level:  Active  Participation Quality:  Appropriate and Attentive  Affect:  Appropriate  Cognitive:  Alert  Insight: Appropriate  Engagement in Group:  Engaged  Modes of Intervention:  Discussion and Orientation  Additional Comments:     [name],Matthew Gift E 12/24/2023, 9:28 PM

## 2023-12-24 NOTE — Progress Notes (Signed)
   12/24/23 0210  Charting Type  Charting Type Admission  Focused Reassessment No Changes Neurological;Psychosocial;Vascular  Focused Reassessment Changes Noted Neurological;Psychosocial;Vascular  Safety Check Verification  Has the RN verified the 15 minute safety check completion? Yes  Neurological  Neuro (WDL) X  Orientation Level Oriented X4  Cognition Appropriate at baseline;Appropriate attention/concentration  Speech Clear  Neuro Symptoms Anxiety;Depression  Neuro symptoms relieved by Rest  HEENT  HEENT (WDL) X  R Eye Reading glasses  L Eye Reading glasses  Tongue Pink;Moist  Mucous Membrane(s) Moist;Pink  Voice Clear  Respiratory  Respiratory Pattern Regular;Unlabored  Chest Assessment Chest expansion symmetrical  Respiratory (WDL) WDL  Cardiac  Cardiac (WDL) X (HTN)  Pulse Regular  Jugular Venous Distention (JVD) No  Antiarrhythmic device No  Vascular  Vascular (WDL) X (HTN)  Integumentary  Integumentary (WDL) X  Staff Member Assisting with Skin Assessment on Admission Drew MHT  Skin Color Appropriate for ethnicity  Skin Condition Dry;Other (Comment) (patchy skin back of neck)  Skin Integrity Intact  Skin Turgor Non-tenting  Braden Scale (Ages 8 and up)  Sensory Perceptions 4  Moisture 4  Activity 4  Mobility 4  Nutrition 3  Friction and Shear 3  Braden Scale Score 22  Musculoskeletal  Musculoskeletal (WDL) X  Assistive Device None  Generalized Weakness Yes  Gastrointestinal  Gastrointestinal (WDL) WDL  Last BM Date  12/23/23  GU Assessment  Genitourinary (WDL) WDL  Genitalia  Male Genitalia Intact  Neurological  Level of Consciousness Alert

## 2023-12-24 NOTE — Tx Team (Signed)
 Initial Treatment Plan 12/24/2023 3:31 AM [name] FMW:[id number]    PATIENT STRESSORS: Financial difficulties   Loss of placement at [facility] due to alcohol relapse   Occupational concerns   Substance abuse     PATIENT STRENGTHS: Work skills  Other: Chief Executive Officer   PATIENT IDENTIFIED PROBLEMS:                      DISCHARGE CRITERIA:  Adequate post-discharge living arrangements Motivation to continue treatment in a less acute level of care Verbal commitment to aftercare and medication compliance  PRELIMINARY DISCHARGE PLAN: Attend 12-step recovery group Placement in alternative living arrangements Return to previous work or school arrangements  PATIENT/FAMILY INVOLVEMENT: This treatment plan has been presented to and reviewed with the patient, Matthew [name].  The patient and family have been given the opportunity to ask questions and make suggestions.  Jon JULIANNA Lucks, RN 12/24/2023, 3:31 AM

## 2023-12-24 NOTE — Progress Notes (Signed)
   12/24/23 0845  Psych Admission Type (Psych Patients Only)  Admission Status Voluntary  Psychosocial Assessment  Patient Complaints Anxiety;Depression;Crying spells  Eye Contact Brief  Facial Expression Anxious  Affect Anxious;Depressed  Speech Soft;Logical/coherent  Interaction Minimal  Motor Activity Tremors  Appearance/Hygiene In scrubs  Behavior Characteristics Cooperative;Anxious  Mood Anxious;Depressed  Aggressive Behavior  Effect No apparent injury  Thought Process  Coherency WDL  Content WDL  Delusions None reported or observed  Perception WDL  Hallucination None reported or observed  Judgment Poor  Confusion None  Danger to Self  Current suicidal ideation? Denies  Self-Injurious Behavior No self-injurious ideation or behavior indicators observed or expressed   Agreement Not to Harm Self Yes  Description of Agreement verbal  Danger to Others  Danger to Others None reported or observed

## 2023-12-24 NOTE — H&P (Signed)
 Psychiatric Admission Assessment Adult  Patient Identification: Matthew [name] MRN:  [id number] Date of Evaluation:  12/24/2023 Chief Complaint:  MDD (major depressive disorder), recurrent episode, moderate (HCC) [F33.1]   History of Present Illness: Patient is a 63 year old male with a past psychiatric history of major depressive disorder and severe alcohol use disorder, presenting voluntarily for inpatient psychiatric treatment. He is currently on an Ativan  taper and receiving thiamine  and multivitamin supplementation.  Per Chart Review: During his last inpatient psychiatric admission approximately 1.5 years ago, discharge medications included Lexapro  10 mg PO daily and Trazodone  100 mg PO QHS. He was discharged to [facility] for a long-term substance abuse program, where he remained for nine months before transferring to an [employer] [facility] [employer]. He discontinued psychiatric medications at that time despite reporting prior benefit.  Patient Report: He reports that before discontinuing medications, they were working well for him. He was most recently living in an [facility] but was asked to leave after a relapse into heavy alcohol use, drinking approximately 15-16 beers daily for the past two months. He reports that the relapse was initially undetected but ultimately led to the loss of his housing, job, car, and stability.  He describes his current mood as significantly depressed and hopeless, with thoughts of harming himself. He denies any current plan or intent but states that in this setting, he is "trying to figure things out." He reports one prior suicide attempt in 2016 when he drank bleach.  He denies auditory or visual hallucinations, paranoia, delusions, marijuana use, or other illicit drug use. He smokes one pack of cigarettes daily. He denies past episodes consistent with mania or hypomania, including hyperactivity, erratic/excessive spending, dangerous activities, grandiosity, or promiscuity.  Social  History: Born in [place], [place] . Raised by grandparents. Married once and divorced. Has three adult children, ages 33, 44, and 13, with whom he has no primary support.  Substance Abuse History: Alcohol: Heavy use, ~15 beers/day, ongoing problem for approximately one year, recent relapse. Tobacco: 1 pack/day. Illicit Drugs: Denies marijuana and other substances. Monitoring: Will continue CIWA protocol for alcohol withdrawal.   Total Time spent with patient: 1 hour Sleep  Sleep:No data recorded Is the patient at risk to self? Yes.    Has the patient been a risk to self in the past 6 months? Yes.    Has the patient been a risk to self within the distant past? Yes.    Is the patient a risk to others? No.  Has the patient been a risk to others in the past 6 months? No.  Has the patient been a risk to others within the distant past? No.   [place] Scale:  Flowsheet Row Admission (Current) from 12/24/2023 in [facility] IN[facility]IENT BE[facility] ED from 12/23/2023 in [facility] Emergency Department at [facility] Admission (Discharged) from 10/28/2022 in [facility]  C-SSRS RISK CATEGORY High Risk High Risk No Risk     Past Medical History:  Past Medical History:  Diagnosis Date   Hypertension    Pneumothorax 12/15/2014   Substance abuse (HCC)    alcohol    Past Surgical History:  Procedure Laterality Date   APPENDECTOMY     COLONOSCOPY WITH PROPOFOL  N/A 10/28/2022   Procedure: COLONOSCOPY WITH PROPOFOL ;  Surgeon: Therisa Bi, MD;  Location: [facility] ENDOSCOPY;  Service: Gastroenterology;  Laterality: N/A;  REQUESTS ABOUT 9 AM ARRIVAL   Family History:  Family History  Problem Relation Age of Onset   Hypertension Mother  Other Father        unknown medical history   Diabetes Maternal Grandmother    Other Maternal Grandfather        unknown medical history   Other Paternal Grandmother        unknown medical history   Other Paternal  Grandfather        unknown medical history    Social History:  Social History   Substance and Sexual Activity  Alcohol Use Yes   Alcohol/week: 105.0 standard drinks of alcohol   Types: 105 Cans of beer per week   Comment: 80 oz beer daily, 1/2 pint liquor daily, last use 07/19/22     Social History   Substance and Sexual Activity  Drug Use Not Currently   Types: Marijuana, Crack cocaine   Comment: last use mid 1990s      Allergies:  No Known Allergies Lab Results:  Results for orders placed or performed during the hospital encounter of 12/23/23 (from the past 48 hours)  Comprehensive metabolic panel     Status: Abnormal   Collection Time: 12/23/23  1:38 PM  Result Value Ref Range   Sodium 129 (L) 135 - 145 mmol/L   Potassium 4.3 3.5 - 5.1 mmol/L   Chloride 92 (L) 98 - 111 mmol/L   CO2 23 22 - 32 mmol/L   Glucose, Bld 74 70 - 99 mg/dL    Comment: Glucose reference range applies only to samples taken after fasting for at least 8 hours.   BUN 14 8 - 23 mg/dL   Creatinine, Ser 8.98 0.61 - 1.24 mg/dL   Calcium  8.8 (L) 8.9 - 10.3 mg/dL   Total Protein 7.4 6.5 - 8.1 g/dL   Albumin 4.1 3.5 - 5.0 g/dL   AST 839 (H) 15 - 41 U/L   ALT 51 (H) 0 - 44 U/L   Alkaline Phosphatase 93 38 - 126 U/L   Total Bilirubin 2.8 (H) 0.0 - 1.2 mg/dL   GFR, Estimated >39 >39 mL/min    Comment: (NOTE) Calculated using the CKD-EPI Creatinine Equation (2021)    Anion gap 14 5 - 15    Comment: Performed at [facility], [address] [place], [place] [address]  Ethanol     Status: Abnormal   Collection Time: 12/23/23  1:38 PM  Result Value Ref Range   Alcohol, Ethyl (B) 268 (H) <15 mg/dL    Comment: (NOTE) For medical purposes only. Performed at [facility], [address] [place], [place] [address]   CBC with Diff     Status: None   Collection Time: 12/23/23  1:38 PM  Result Value Ref Range   WBC 6.8 4.0 - 10.5 K/uL   RBC 4.78 4.22 - 5.81 MIL/uL   Hemoglobin 15.3  13.0 - 17.0 g/dL   HCT 54.5 60.9 - 47.9 %   MCV 95.0 80.0 - 100.0 fL   MCH 32.0 26.0 - 34.0 pg   MCHC 33.7 30.0 - 36.0 g/dL   RDW 88.0 88.4 - 84.4 %   Platelets 252 150 - 400 K/uL   nRBC 0.0 0.0 - 0.2 %   Neutrophils Relative % 71 %   Neutro Abs 4.9 1.7 - 7.7 K/uL   Lymphocytes Relative 22 %   Lymphs Abs 1.5 0.7 - 4.0 K/uL   Monocytes Relative 6 %   Monocytes Absolute 0.4 0.1 - 1.0 K/uL   Eosinophils Relative 0 %   Eosinophils Absolute 0.0 0.0 - 0.5 K/uL  Basophils Relative 1 %   Basophils Absolute 0.0 0.0 - 0.1 K/uL   Immature Granulocytes 0 %   Abs Immature Granulocytes 0.03 0.00 - 0.07 K/uL    Comment: Performed at [facility], [address] [place], [place] [address]  Urine Drug Screen, Qualitative     Status: None   Collection Time: 12/24/23  1:03 AM  Result Value Ref Range   Tricyclic, Ur Screen NONE DETECTED NONE DETECTED   Amphetamines, Ur Screen NONE DETECTED NONE DETECTED   MDMA (Ecstasy)Ur Screen NONE DETECTED NONE DETECTED   Cocaine Metabolite,Ur [place] NONE DETECTED NONE DETECTED   Opiate, Ur Screen NONE DETECTED NONE DETECTED   Phencyclidine (PCP) Ur S NONE DETECTED NONE DETECTED   Cannabinoid 50 Ng, Ur [place] NONE DETECTED NONE DETECTED   Barbiturates, Ur Screen NONE DETECTED NONE DETECTED   Benzodiazepine, Ur Scrn NONE DETECTED NONE DETECTED   Methadone Scn, Ur NONE DETECTED NONE DETECTED    Comment: (NOTE) Tricyclics + metabolites, urine    Cutoff 1000 ng/mL Amphetamines + metabolites, urine  Cutoff 1000 ng/mL MDMA (Ecstasy), urine              Cutoff 500 ng/mL Cocaine Metabolite, urine          Cutoff 300 ng/mL Opiate + metabolites, urine        Cutoff 300 ng/mL Phencyclidine (PCP), urine         Cutoff 25 ng/mL Cannabinoid, urine                 Cutoff 50 ng/mL Barbiturates + metabolites, urine  Cutoff 200 ng/mL Benzodiazepine, urine              Cutoff 200 ng/mL Methadone, urine                   Cutoff 300 ng/mL  The urine drug screen provides  only a preliminary, unconfirmed analytical test result and should not be used for non-medical purposes. Clinical consideration and professional judgment should be applied to any positive drug screen result due to possible interfering substances. A more specific alternate chemical method must be used in order to obtain a confirmed analytical result. Gas chromatography / mass spectrometry (GC/MS) is the preferred confirm atory method. Performed at [facility], [address] [place], [place] [address]     Blood Alcohol level:  Lab Results  Component Value Date   ETH 268 (H) 12/23/2023   ETH <10 07/20/2022    Metabolic Disorder Labs:  Lab Results  Component Value Date   HGBA1C 4.4 12/22/2014   No results found for: PROLACTIN Lab Results  Component Value Date   CHOL 171 01/04/2023   TRIG 49 01/04/2023   HDL 45 01/04/2023   CHOLHDL 3.8 01/04/2023   LDLCALC 116 (H) 01/04/2023   LDLCALC 120 (H) 10/13/2022    Current Medications: Current Facility-Administered Medications  Medication Dose Route Frequency Provider Last Rate Last Admin   acetaminophen  (TYLENOL ) tablet 650 mg  650 mg Oral Q6H PRN Bobbitt, Shalon E, NP       alum & mag hydroxide-simeth (MAALOX/MYLANTA) 200-200-20 MG/5ML suspension 30 mL  30 mL Oral Q4H PRN Bobbitt, Shalon E, NP       haloperidol  (HALDOL ) tablet 5 mg  5 mg Oral TID PRN Bobbitt, Shalon E, NP       And   diphenhydrAMINE  (BENADRYL ) capsule 50 mg  50 mg Oral TID PRN Bobbitt, Shalon E, NP       haloperidol   lactate (HALDOL ) injection 5 mg  5 mg Intramuscular TID PRN Bobbitt, Shalon E, NP       And   diphenhydrAMINE  (BENADRYL ) injection 50 mg  50 mg Intramuscular TID PRN Bobbitt, Shalon E, NP       And   LORazepam  (ATIVAN ) injection 2 mg  2 mg Intramuscular TID PRN Bobbitt, Shalon E, NP       haloperidol  lactate (HALDOL ) injection 10 mg  10 mg Intramuscular TID PRN Bobbitt, Shalon E, NP       And   diphenhydrAMINE  (BENADRYL ) injection 50  mg  50 mg Intramuscular TID PRN Bobbitt, Shalon E, NP       And   LORazepam  (ATIVAN ) injection 2 mg  2 mg Intramuscular TID PRN Bobbitt, Shalon E, NP       escitalopram  (LEXAPRO ) tablet 10 mg  10 mg Oral Daily Cleotilde Hoy HERO, NP       feeding supplement (ENSURE PLUS HIGH PROTEIN) liquid 237 mL  237 mL Oral BID BM Jadapalle, Sree, MD   237 mL at 12/24/23 1444   hydrOXYzine  (ATARAX ) tablet 25 mg  25 mg Oral Q6H PRN Bobbitt, Shalon E, NP       loperamide  (IMODIUM ) capsule 2-4 mg  2-4 mg Oral PRN Bobbitt, Shalon E, NP       LORazepam  (ATIVAN ) tablet 1 mg  1 mg Oral Q6H PRN Bobbitt, Shalon E, NP       LORazepam  (ATIVAN ) tablet 1 mg  1 mg Oral QID Bobbitt, Shalon E, NP   1 mg at 12/24/23 1300   Followed by   NOREEN ON 12/25/2023] LORazepam  (ATIVAN ) tablet 1 mg  1 mg Oral TID Bobbitt, Shalon E, NP       Followed by   NOREEN ON 12/26/2023] LORazepam  (ATIVAN ) tablet 1 mg  1 mg Oral BID Bobbitt, Shalon E, NP       Followed by   NOREEN ON 12/28/2023] LORazepam  (ATIVAN ) tablet 1 mg  1 mg Oral Daily Bobbitt, Shalon E, NP       magnesium  hydroxide (MILK OF MAGNESIA) suspension 30 mL  30 mL Oral Daily PRN Bobbitt, Shalon E, NP       multivitamin with minerals tablet 1 tablet  1 tablet Oral Daily Bobbitt, Shalon E, NP   1 tablet at 12/24/23 0801   nicotine  (NICODERM CQ  - dosed in mg/24 hours) patch 21 mg  21 mg Transdermal Daily Jadapalle, Sree, MD       nicotine  polacrilex (NICORETTE ) gum 2 mg  2 mg Oral PRN Jadapalle, Sree, MD       ondansetron  (ZOFRAN -ODT) disintegrating tablet 4 mg  4 mg Oral Q6H PRN Bobbitt, Shalon E, NP       [START ON 12/25/2023] thiamine  (VITAMIN B1) tablet 100 mg  100 mg Oral Daily Bobbitt, Shalon E, NP       traZODone  (DESYREL ) tablet 100 mg  100 mg Oral QHS Cleotilde Hoy HERO, NP       PTA Medications: Medications Prior to Admission  Medication Sig Dispense Refill Last Dose/Taking   amLODipine  (NORVASC ) 10 MG tablet Take 1 tablet (10 mg total) by mouth daily. 90 tablet 1     escitalopram  (LEXAPRO ) 10 MG tablet Take 1 tablet (10 mg total) by mouth daily. 30 tablet 1    rosuvastatin  (CRESTOR ) 5 MG tablet Take 1 tablet (5 mg total) by mouth daily. 60 tablet 0     Psychiatric Specialty Exam: Appearance: Disheveled. Eye Contact: Intermittent. Speech: Normal rate  and rhythm, low volume. Mood: Dysphoric. Affect: Slightly constricted. Thought Process: Linear and goal-directed. Thought Content: No psychotic content; denies hallucinations, paranoia, or delusions. Insight: Fair. Judgment: Poor. Attention: Good. Concentration: Fair. Recall, Fund of Knowledge, Language: Within normal limits. Psychomotor: No retardation or agitation observed.  Physical Exam: Physical Exam ROS Blood pressure (!) 162/105, pulse 98, temperature 98.5 F (36.9 C), temperature source Oral, resp. rate 18, height 5' 10 (1.778 m), weight 74.4 kg, SpO2 95%. Body mass index is 23.53 kg/m.  Principal Diagnosis: MDD (major depressive disorder), recurrent episode, moderate (HCC) Diagnosis:  Principal Problem:   MDD (major depressive disorder), recurrent episode, moderate (HCC)     Safety and Monitoring:             -- Voluntary admission to inpatient psychiatric unit for safety, stabilization and treatment             -- Daily contact with patient to assess and evaluate symptoms and progress in treatment             -- Patient's case to be discussed in multi-disciplinary team meeting             -- Observation Level: q15 minute checks             -- Vital signs:  q12 hours             -- Precautions: suicide, elopement, and assault   2. Psychiatric Diagnoses and Treatment:  Major depressive disorder without psychotic features. Severe alcohol use disorder with recent relapse. Notable functional decline following relapse, including loss of housing, employment, and transportation. There may be an element of malingering given recent psychosocial losses and request for inpatient  care.  Patient presenting with significant mood decompensation in the context of multiple psychosocial stressors and recent relapse of alcohol, he is appropriate for inpatient psychiatric admission for medication management, symptom stabilization and safety.   Plan: Resume Lexapro  10 mg PO daily and Trazodone  100 mg PO QHS, both previously effective. Continue Ativan  taper, thiamine , and multivitamin supplementation. Monitor on CIWA protocol for alcohol withdrawal. Repeat liver function tests; if trending down, consider starting naltrexone 50 mg PO daily for alcohol cravings. Monitor depressive symptoms, which include anergia, anhedonia, amotivation, hopelessness, worthlessness, and guilt over relapse. Support engagement with substance abuse treatment planning and explore rehabilitation placement upon discharge.         -- The risks/benefits/side-effects/alternatives to this medication were discussed in detail with the patient and time was given for questions. The patient consents to medication trial.                -- Metabolic profile and EKG monitoring obtained while on an atypical antipsychotic (BMI: Lipid Panel: HbgA1c: QTc:)              -- Encouraged patient to participate in unit milieu and in scheduled group therapies                            3. Medical Issues Being Addressed: CIWA in place      4. Discharge Planning:              -- Social work and case management to assist with discharge planning and identification of hospital follow-up needs prior to discharge             -- Estimated LOS: 5-7 days             -- Discharge  Concerns: Need to establish a safety plan; Medication compliance and effectiveness             -- Discharge Goals: Return home with outpatient referrals follow ups  Physician Treatment Plan for Primary Diagnosis: MDD (major depressive disorder), recurrent episode, moderate (HCC) Long Term Goal(s): Improvement in symptoms so as ready for discharge  Short  Term Goals: Ability to identify changes in lifestyle to reduce recurrence of condition will improve  Physician Treatment Plan for Secondary Diagnosis: Principal Problem:   MDD (major depressive disorder), recurrent episode, moderate (HCC)  Long Term Goal(s): Improvement in symptoms so as ready for discharge  Short Term Goals: Ability to identify changes in lifestyle to reduce recurrence of condition will improve  I certify that inpatient services furnished can reasonably be expected to improve the patient's condition.    Hoy CHRISTELLA [place], NP 8/10/20254:16 PM

## 2023-12-24 NOTE — Plan of Care (Signed)

## 2023-12-25 DIAGNOSIS — F331 Major depressive disorder, recurrent, moderate: Secondary | ICD-10-CM | POA: Diagnosis not present

## 2023-12-25 LAB — LIPID PANEL
Cholesterol: 181 mg/dL (ref 0–200)
HDL: 121 mg/dL (ref >40)
LDL Cholesterol: 52 mg/dL (ref 0–99)
Total CHOL/HDL Ratio: 1.5 ratio
Triglycerides: 41 mg/dL (ref <150)
VLDL: 8 mg/dL (ref 0–40)

## 2023-12-25 NOTE — BH IP Treatment Plan (Signed)
 Interdisciplinary Treatment and Diagnostic Plan Update  12/25/2023 Time of Session: 10:00AM KASHTYN JANKOWSKI MRN: [id number]  Principal Diagnosis: MDD (major depressive disorder), recurrent episode, moderate (HCC)  Secondary Diagnoses: Principal Problem:   MDD (major depressive disorder), recurrent episode, moderate (HCC)   Current Medications:  Current Facility-Administered Medications  Medication Dose Route Frequency Provider Last Rate Last Admin   acetaminophen  (TYLENOL ) tablet 650 mg  650 mg Oral Q6H PRN Bobbitt, Shalon E, NP       alum & mag hydroxide-simeth (MAALOX/MYLANTA) 200-200-20 MG/5ML suspension 30 mL  30 mL Oral Q4H PRN Bobbitt, Shalon E, NP       haloperidol  (HALDOL ) tablet 5 mg  5 mg Oral TID PRN Bobbitt, Shalon E, NP       And   diphenhydrAMINE  (BENADRYL ) capsule 50 mg  50 mg Oral TID PRN Bobbitt, Shalon E, NP       haloperidol  lactate (HALDOL ) injection 5 mg  5 mg Intramuscular TID PRN Bobbitt, Shalon E, NP       And   diphenhydrAMINE  (BENADRYL ) injection 50 mg  50 mg Intramuscular TID PRN Bobbitt, Shalon E, NP       And   LORazepam  (ATIVAN ) injection 2 mg  2 mg Intramuscular TID PRN Bobbitt, Shalon E, NP       haloperidol  lactate (HALDOL ) injection 10 mg  10 mg Intramuscular TID PRN Bobbitt, Shalon E, NP       And   diphenhydrAMINE  (BENADRYL ) injection 50 mg  50 mg Intramuscular TID PRN Bobbitt, Shalon E, NP       And   LORazepam  (ATIVAN ) injection 2 mg  2 mg Intramuscular TID PRN Bobbitt, Shalon E, NP       escitalopram  (LEXAPRO ) tablet 10 mg  10 mg Oral Daily Cleotilde Hoy HERO, NP   10 mg at 12/25/23 0805   feeding supplement (ENSURE PLUS HIGH PROTEIN) liquid 237 mL  237 mL Oral BID BM Jadapalle, Sree, MD   237 mL at 12/24/23 1444   hydrOXYzine  (ATARAX ) tablet 25 mg  25 mg Oral Q6H PRN Bobbitt, Shalon E, NP       loperamide  (IMODIUM ) capsule 2-4 mg  2-4 mg Oral PRN Bobbitt, Shalon E, NP       LORazepam  (ATIVAN ) tablet 1 mg  1 mg Oral Q6H PRN Bobbitt, Shalon E, NP        LORazepam  (ATIVAN ) tablet 1 mg  1 mg Oral QID Bobbitt, Shalon E, NP   1 mg at 12/25/23 0805   Followed by   LORazepam  (ATIVAN ) tablet 1 mg  1 mg Oral TID Bobbitt, Shalon E, NP       Followed by   NOREEN ON 12/26/2023] LORazepam  (ATIVAN ) tablet 1 mg  1 mg Oral BID Bobbitt, Shalon E, NP       Followed by   NOREEN ON 12/28/2023] LORazepam  (ATIVAN ) tablet 1 mg  1 mg Oral Daily Bobbitt, Shalon E, NP       magnesium  hydroxide (MILK OF MAGNESIA) suspension 30 mL  30 mL Oral Daily PRN Bobbitt, Shalon E, NP       multivitamin with minerals tablet 1 tablet  1 tablet Oral Daily Bobbitt, Shalon E, NP   1 tablet at 12/25/23 0805   nicotine  (NICODERM CQ  - dosed in mg/24 hours) patch 21 mg  21 mg Transdermal Daily Jadapalle, Sree, MD       nicotine  polacrilex (NICORETTE ) gum 2 mg  2 mg Oral PRN Jadapalle, Sree, MD  ondansetron  (ZOFRAN -ODT) disintegrating tablet 4 mg  4 mg Oral Q6H PRN Bobbitt, Shalon E, NP       thiamine  (VITAMIN B1) tablet 100 mg  100 mg Oral Daily Bobbitt, Shalon E, NP   100 mg at 12/25/23 9193   traZODone  (DESYREL ) tablet 100 mg  100 mg Oral QHS Cleotilde Hoy HERO, NP   100 mg at 12/24/23 2137   PTA Medications: Medications Prior to Admission  Medication Sig Dispense Refill Last Dose/Taking   amLODipine  (NORVASC ) 10 MG tablet Take 1 tablet (10 mg total) by mouth daily. 90 tablet 1    escitalopram  (LEXAPRO ) 10 MG tablet Take 1 tablet (10 mg total) by mouth daily. 30 tablet 1    rosuvastatin  (CRESTOR ) 5 MG tablet Take 1 tablet (5 mg total) by mouth daily. 60 tablet 0     Patient Stressors: Financial difficulties   Loss of placement at [employer] due to alcohol relapse   Occupational concerns   Substance abuse    Patient Strengths: Work Programmer, applications  Other: Chief Executive Officer  Treatment Modalities: Medication Management, Group therapy, Case management,  1 to 1 session with clinician, Psychoeducation, Recreational therapy.   Physician Treatment Plan for Primary Diagnosis: MDD  (major depressive disorder), recurrent episode, moderate (HCC) Long Term Goal(s): Improvement in symptoms so as ready for discharge   Short Term Goals: Ability to identify changes in lifestyle to reduce recurrence of condition will improve  Medication Management: Evaluate patient's response, side effects, and tolerance of medication regimen.  Therapeutic Interventions: 1 to 1 sessions, Unit Group sessions and Medication administration.  Evaluation of Outcomes: Progressing  Physician Treatment Plan for Secondary Diagnosis: Principal Problem:   MDD (major depressive disorder), recurrent episode, moderate (HCC)  Long Term Goal(s): Improvement in symptoms so as ready for discharge   Short Term Goals: Ability to identify changes in lifestyle to reduce recurrence of condition will improve     Medication Management: Evaluate patient's response, side effects, and tolerance of medication regimen.  Therapeutic Interventions: 1 to 1 sessions, Unit Group sessions and Medication administration.  Evaluation of Outcomes: Progressing   RN Treatment Plan for Primary Diagnosis: MDD (major depressive disorder), recurrent episode, moderate (HCC) Long Term Goal(s): Knowledge of disease and therapeutic regimen to maintain health will improve  Short Term Goals: Ability to demonstrate self-control, Ability to participate in decision making will improve, Ability to verbalize feelings will improve, Ability to disclose and discuss suicidal ideas, Ability to identify and develop effective coping behaviors will improve, and Compliance with prescribed medications will improve  Medication Management: RN will administer medications as ordered by provider, will assess and evaluate patient's response and provide education to patient for prescribed medication. RN will report any adverse and/or side effects to prescribing provider.  Therapeutic Interventions: 1 on 1 counseling sessions, Psychoeducation, Medication  administration, Evaluate responses to treatment, Monitor vital signs and CBGs as ordered, Perform/monitor CIWA, COWS, AIMS and Fall Risk screenings as ordered, Perform wound care treatments as ordered.  Evaluation of Outcomes: Progressing   LCSW Treatment Plan for Primary Diagnosis: MDD (major depressive disorder), recurrent episode, moderate (HCC) Long Term Goal(s): Safe transition to appropriate next level of care at discharge, Engage patient in therapeutic group addressing interpersonal concerns.  Short Term Goals: Engage patient in aftercare planning with referrals and resources, Increase social support, Increase ability to appropriately verbalize feelings, Increase emotional regulation, Facilitate acceptance of mental health diagnosis and concerns, and Increase skills for wellness and recovery  Therapeutic Interventions: Assess for all discharge  needs, 1 to 1 time with Child psychotherapist, Explore available resources and support systems, Assess for adequacy in community support network, Educate family and significant other(s) on suicide prevention, Complete Psychosocial Assessment, Interpersonal group therapy.  Evaluation of Outcomes: Progressing   Progress in Treatment: Attending groups: Yes. Participating in groups: Yes. Taking medication as prescribed: Yes. Toleration medication: Yes. Family/Significant other contact made: No, will contact:  once permission has been granted Patient understands diagnosis: Yes. Discussing patient identified problems/goals with staff: Yes. Medical problems stabilized or resolved: Yes. Denies suicidal/homicidal ideation: Yes. Issues/concerns per patient self-inventory: No. Other: none  New problem(s) identified: No, Describe:  none  New Short Term/Long Term Goal(s): detox, elimination of symptoms of psychosis, medication management for mood stabilization; elimination of SI thoughts; development of comprehensive mental wellness/sobriety plan.   Patient  Goals:  to get out of here  Discharge Plan or Barriers: CSW to assist in development of appropriate discharge plans.   Reason for Continuation of Hospitalization: Anxiety Depression Medication stabilization Suicidal ideation  Estimated Length of Stay: 1-7 days  Last 3 [place] Suicide Severity Risk Score: Flowsheet Row Admission (Current) from 12/24/2023 in [facility]PATIENT BEH[facility]ICINE ED from 12/23/2023 in [facility] Emergency Department at [facility] Admission (Discharged) from 10/28/2022 in [facility]  C-SSRS RISK CATEGORY High Risk High Risk No Risk    Last PHQ 2/9 Scores:    10/13/2022   12:20 PM 10/13/2022   11:59 AM  Depression screen PHQ 2/9  Decreased Interest 2 0  Down, Depressed, Hopeless 1 0  PHQ - 2 Score 3 0  Tired, decreased energy 1   Change in appetite 2   Feeling bad or failure about yourself  1   Trouble concentrating 1   Moving slowly or fidgety/restless 1   Suicidal thoughts 0     Scribe for Treatment Team: Sherryle JINNY Margo, LCSW 12/25/2023 11:24 AM

## 2023-12-25 NOTE — Group Note (Signed)
 Date:  12/25/2023 Time:  9:07 PM  Group Topic/Focus:  Personal Choices and Values:   The focus of this group is to help patients assess and explore the importance of values in their lives, how their values affect their decisions, how they express their values and what opposes their expression.   PT did not attend group.  Rehema Muffley L 12/25/2023, 9:07 PM

## 2023-12-25 NOTE — Progress Notes (Signed)
   12/24/23 2200  Psych Admission Type (Psych Patients Only)  Admission Status Voluntary  Psychosocial Assessment  Patient Complaints Depression  Eye Contact Brief  Facial Expression Anxious  Affect Depressed  Speech Soft;Logical/coherent  Interaction Minimal  Motor Activity Tremors  Appearance/Hygiene In scrubs  Behavior Characteristics Cooperative  Mood Depressed  Aggressive Behavior  Effect No apparent injury  Thought Process  Coherency WDL  Content WDL  Delusions None reported or observed  Perception WDL  Hallucination None reported or observed  Judgment UTA (pt was asleep)  Confusion None  Danger to Self  Current suicidal ideation? Denies  Agreement Not to Harm Self Yes  Description of Agreement verbal  Danger to Others  Danger to Others None reported or observed   Patient denies  SI/HI/AVH no distress noted interacting appropriately with peers and staff.

## 2023-12-25 NOTE — Progress Notes (Signed)
   12/25/23 2200  Psych Admission Type (Psych Patients Only)  Admission Status Voluntary  Psychosocial Assessment  Patient Complaints Worrying  Eye Contact Fair  Facial Expression Other (Comment) (Smiling)  Affect Appropriate to circumstance  Speech Soft;Logical/coherent  Interaction Minimal  Motor Activity Slow  Appearance/Hygiene Unremarkable  Behavior Characteristics Cooperative  Mood Pleasant  Thought Process  Coherency WDL  Content WDL  Delusions None reported or observed  Perception WDL  Hallucination None reported or observed  Judgment WDL  Confusion None  Danger to Self  Current suicidal ideation? Denies  Self-Injurious Behavior No self-injurious ideation or behavior indicators observed or expressed   Agreement Not to Harm Self Yes  Description of Agreement Verbalized  Danger to Others  Danger to Others None reported or observed

## 2023-12-25 NOTE — Group Note (Signed)
 Recreation Therapy Group Note   Group Topic:Health and Wellness  Group Date: 12/25/2023 Start Time: 1040 End Time: 1140 Facilitators: Celestia Jeoffrey BRAVO, LRT, CTRS Location: Courtyard  Group Description: [employer]. LRT and patients played games of basketball, drew with chalk, and played corn hole while outside in the courtyard while getting fresh air and sunlight. Music was being played in the background. LRT and peers conversed about different games they have played before, what they do in their free time and anything else that is on their minds. LRT encouraged pts to drink water after being outside, sweating and getting their heart rate up.  Goal Area(s) Addressed: Patient will build on frustration tolerance skills. Patients will partake in a competitive play game with peers. Patients will gain knowledge of new leisure interest/hobby.    Affect/Mood: N/A   Participation Level: Did not attend    Clinical Observations/Individualized Feedback: Patient did not attend group.   Plan: Continue to engage patient in RT group sessions 2-3x/week.   Jeoffrey BRAVO Celestia, LRT, CTRS 12/25/2023 1:15 PM

## 2023-12-25 NOTE — Progress Notes (Signed)
   12/25/23 0805  Psych Admission Type (Psych Patients Only)  Admission Status Voluntary  Psychosocial Assessment  Patient Complaints Depression  Eye Contact Avertive  Facial Expression Anxious  Affect Depressed  Speech Logical/coherent;Soft  Interaction Minimal  Motor Activity Tremors  Appearance/Hygiene In scrubs  Behavior Characteristics Cooperative  Mood Depressed  Aggressive Behavior  Effect No apparent injury  Thought Process  Coherency WDL  Content WDL  Delusions None reported or observed  Perception WDL  Hallucination None reported or observed  Judgment Poor  Confusion None  Danger to Self  Current suicidal ideation? Denies  Description of Suicide Plan no plan  Self-Injurious Behavior No self-injurious ideation or behavior indicators observed or expressed   Agreement Not to Harm Self Yes  Description of Agreement verbal

## 2023-12-25 NOTE — Group Note (Signed)
 Recreation Therapy Group Note   Group Topic:Coping Skills  Group Date: 12/25/2023 Start Time: 1530 End Time: 1550 Facilitators: Celestia Jeoffrey FORBES ARTICE, CTRS Location: Craft Room  Group Description: Mind Map.  Patient was provided a blank template of a diagram with 32 blank boxes in a tiered system, branching from the center (similar to a bubble chart). LRT directed patients to label the middle of the diagram Coping Skills. LRT and patients then came up with 8 different coping skills as examples. Pt were directed to record their coping skills in the 2nd tier boxes closest to the center.  Patients would then share their coping skills with the group as LRT wrote them out. LRT gave a handout of 99 different coping skills at the end of group.   Goal Area(s) Addressed: Patients will be able to define "coping skills". Patient will identify new coping skills.  Patient will increase communication.   Affect/Mood: N/A   Participation Level: Minimal    Clinical Observations/Individualized Feedback: Andriel came late to group and left early. Pt minimally interacted with LRT and peers while present.   Plan: Continue to engage patient in RT group sessions 2-3x/week.   Jeoffrey FORBES Celestia, LRT, CTRS 12/25/2023 5:27 PM

## 2023-12-25 NOTE — Progress Notes (Signed)
 NUTRITION ASSESSMENT  Pt identified as at risk on the Malnutrition Screen Tool  INTERVENTION:  -Continue 100 mg thiamine  daily -Continue MVI with minerals daily -Continue Ensure Plus High Protein po BID, each supplement provides 350 kcal and 20 grams of protein   NUTRITION DIAGNOSIS: Unintentional weight loss related to sub-optimal intake as evidenced by pt report.   Goal: Pt to meet >/= 90% of their estimated nutrition needs.  Monitor:  PO intake  Assessment:   Pt with a past psychiatric history of major depressive disorder and severe alcohol use disorder, presenting voluntarily for inpatient psychiatric treatment. He is currently on an Ativan  taper and receiving thiamine  and multivitamin supplementation.   Pt with history of prior psychiatric hospitalization about 1.5 years ago; completed a long term substance abuse program, but relapsed and was asked to leave. He was most recently living in an [facility] but was asked to leave after a relapse into heavy alcohol use, drinking approximately 15-16 beers daily for the past two months. He reports that the relapse was initially undetected but ultimately led to the loss of his housing, job, car, and stability. Per H&P, pt consumes 15-16 beers daily for the past year.   Pt currently on a regular diet. No meal completion data available to assess at this time.  Reviewed wt hx; pt has experienced a 15.7% wt loss over the past year. While this is not significant for time frame, it is concerning given substance abuse. Pt would benefit from addition of oral nutrition supplements due to high risk for malnutrition.   Medications reviewed and include lexapro , ativan , MVI, and thiamine .   Labs reviewed: Na: 129. Tox screen negative.   63 y.o. male  Height: Ht Readings from Last 1 Encounters:  12/24/23 5' 10 (1.778 m)    Weight: Wt Readings from Last 1 Encounters:  12/24/23 74.4 kg    Weight Hx: Wt Readings from Last 10 Encounters:   12/24/23 74.4 kg  12/23/23 77.1 kg  02/09/23 91.2 kg  01/11/23 88.3 kg  11/09/22 87.9 kg  10/28/22 85.8 kg  10/13/22 86.3 kg  07/20/22 74.8 kg  07/20/22 76 kg  07/28/21 76.7 kg    BMI:  Body mass index is 23.53 kg/m. BMI WDL.   Estimated Nutritional Needs: Kcal: 25-30 kcal/kg Protein: > 1 gram protein/kg Fluid: 1 ml/kcal  Diet Order:  Diet Order             Diet regular Room service appropriate? Yes; Fluid consistency: Thin  Diet effective now                  Pt is also offered choice of unit snacks mid-morning and mid-afternoon.  Pt is eating as desired.   Lab results and medications reviewed.   Margery ORN, RD, LDN, CDCES Registered Dietitian III Certified Diabetes Care and Education Specialist If unable to reach this RD, please use RD Inpatient group chat on secure chat between hours of 8am-4 pm daily

## 2023-12-25 NOTE — Group Note (Signed)
 Date:  12/25/2023 Time:  10:22 AM  Group Topic/Focus:  Goals Group:   The focus of this group is to help patients establish daily goals to achieve during treatment and discuss how the patient can incorporate goal setting into their daily lives to aide in recovery.    Participation Level:  Active  Participation Quality:  Appropriate  Affect:  Appropriate  Cognitive:  Alert  Insight: Appropriate  Engagement in Group:  Engaged  Modes of Intervention:  Activity, Discussion, and Education  Additional Comments:     Skippy LITTIE Bennett 12/25/2023, 10:22 AM

## 2023-12-25 NOTE — Plan of Care (Signed)

## 2023-12-25 NOTE — Group Note (Signed)
 [facility] LCSW Group Therapy Note    Group Date: 12/25/2023 Start Time: 1300 End Time: 1400  Type of Therapy and Topic:  Group Therapy:  Overcoming Obstacles  Participation Level:  [facility] PARTICIPATION LEVEL: Active  Mood:  Description of Group:   In this group patients will be encouraged to explore what they see as obstacles to their own wellness and recovery. They will be guided to discuss their thoughts, feelings, and behaviors related to these obstacles. The group will process together ways to cope with barriers, with attention given to specific choices patients can make. Each patient will be challenged to identify changes they are motivated to make in order to overcome their obstacles. This group will be process-oriented, with patients participating in exploration of their own experiences as well as giving and receiving support and challenge from other group members.  Therapeutic Goals: 1. Patient will identify personal and current obstacles as they relate to admission. 2. Patient will identify barriers that currently interfere with their wellness or overcoming obstacles.  3. Patient will identify feelings, thought process and behaviors related to these barriers. 4. Patient will identify two changes they are willing to make to overcome these obstacles:    Summary of Patient Progress Patient was present in group. Patient dicussed how he has struggled with joining AA/NA. He reports that his trust has been damaged in the past and that this has been one of his largest obstacles.    Therapeutic Modalities:   Cognitive Behavioral Therapy Solution Focused Therapy Motivational Interviewing Relapse Prevention Therapy   Sherryle JINNY Margo, LCSW

## 2023-12-25 NOTE — Progress Notes (Signed)
 [facility] MD Progress Note  12/25/2023 1:29 PM Matthew [name]  MRN:  [id number]  Patient is a 63 year old male with a past psychiatric history of major depressive disorder and severe alcohol use disorder, presenting voluntarily for inpatient psychiatric treatment. He is currently on an Ativan  taper and receiving thiamine  and multivitamin supplementation.    Subjective:  Chart reviewed, case discussed in multidisciplinary meeting, patient seen during rounds.   On exam today he is alert and oriented. He is pleasant and cooperative. Denies current withdrawal. Is future oriented with talks about going to [place]  to stay with family and start over. Notes passive si without plan or intent. Rates depression at 5/10. Denies anxiety. Notes stable appetite and sleep.   Sleep: Fair  Appetite:  Fair  Past Psychiatric History: see h&P Family History:  Family History  Problem Relation Age of Onset   Hypertension Mother    Other Father        unknown medical history   Diabetes Maternal Grandmother    Other Maternal Grandfather        unknown medical history   Other Paternal Grandmother        unknown medical history   Other Paternal Grandfather        unknown medical history   Social History:  Social History   Substance and Sexual Activity  Alcohol Use Yes   Alcohol/week: 105.0 standard drinks of alcohol   Types: 105 Cans of beer per week   Comment: 80 oz beer daily, 1/2 pint liquor daily, last use 07/19/22     Social History   Substance and Sexual Activity  Drug Use Not Currently   Types: Marijuana, Crack cocaine   Comment: last use mid 71s    Social History   Socioeconomic History   Marital status: Divorced    Spouse name: Not on file   Number of children: Not on file   Years of education: Not on file   Highest education level: Not on file  Occupational History   Occupation: Public affairs consultant at [employer]    Comment: pt afraid he's lost his employment  Tobacco Use    Smoking status: Every Day    Current packs/day: 0.50    Average packs/day: 0.5 packs/day for 35.0 years (17.5 ttl pk-yrs)    Types: Cigarettes   Smokeless tobacco: Never   Tobacco comments:    Patient is at [facility] for alcohol abuse  Vaping Use   Vaping status: Never Used  Substance and Sexual Activity   Alcohol use: Yes    Alcohol/week: 105.0 standard drinks of alcohol    Types: 105 Cans of beer per week    Comment: 80 oz beer daily, 1/2 pint liquor daily, last use 07/19/22   Drug use: Not Currently    Types: Marijuana, Crack cocaine    Comment: last use mid 1990s   Sexual activity: Not Currently    Birth control/protection: None  Other Topics Concern   Not on file  Social History Narrative   Not on file   Social Drivers of Health   Financial Resource Strain: Low Risk  (01/11/2023)   Overall Financial Resource Strain (CARDIA)    Difficulty of Paying Living Expenses: Not hard at all  Food Insecurity: No Food Insecurity (12/24/2023)   Hunger Vital Sign    Worried About Running Out of Food in the Last Year: Never true    Ran Out of Food in the Last Year: Never true  Transportation Needs: Unmet Transportation Needs (  12/24/2023)   PRAPARE - Transportation    Lack of Transportation (Medical): Yes    Lack of Transportation (Non-Medical): Yes  Physical Activity: Insufficiently Active (01/11/2023)   Exercise Vital Sign    Days of Exercise per Week: 2 days    Minutes of Exercise per Session: 20 min  Stress: No Stress Concern Present (01/11/2023)   [employer] of Occupational Health - Occupational Stress Questionnaire    Feeling of Stress : Not at all  Social Connections: Socially Isolated (01/11/2023)   Social Connection and Isolation Panel    Frequency of Communication with Friends and Family: Three times a week    Frequency of Social Gatherings with Friends and Family: Three times a week    Attends Religious Services: Never    Active Member of Clubs or Organizations: No     Attends Banker Meetings: Never    Marital Status: Divorced   Past Medical History:  Past Medical History:  Diagnosis Date   Hypertension    Pneumothorax 12/15/2014   Substance abuse (HCC)    alcohol    Past Surgical History:  Procedure Laterality Date   APPENDECTOMY     COLONOSCOPY WITH PROPOFOL  N/A 10/28/2022   Procedure: COLONOSCOPY WITH PROPOFOL ;  Surgeon: Therisa Bi, MD;  Location: [facility] ENDOSCOPY;  Service: Gastroenterology;  Laterality: N/A;  REQUESTS ABOUT 9 AM ARRIVAL    Current Medications: Current Facility-Administered Medications  Medication Dose Route Frequency Provider Last Rate Last Admin   acetaminophen  (TYLENOL ) tablet 650 mg  650 mg Oral Q6H PRN Bobbitt, Shalon E, NP       alum & mag hydroxide-simeth (MAALOX/MYLANTA) 200-200-20 MG/5ML suspension 30 mL  30 mL Oral Q4H PRN Bobbitt, Shalon E, NP       haloperidol  (HALDOL ) tablet 5 mg  5 mg Oral TID PRN Bobbitt, Shalon E, NP       And   diphenhydrAMINE  (BENADRYL ) capsule 50 mg  50 mg Oral TID PRN Bobbitt, Shalon E, NP       haloperidol  lactate (HALDOL ) injection 5 mg  5 mg Intramuscular TID PRN Bobbitt, Shalon E, NP       And   diphenhydrAMINE  (BENADRYL ) injection 50 mg  50 mg Intramuscular TID PRN Bobbitt, Shalon E, NP       And   LORazepam  (ATIVAN ) injection 2 mg  2 mg Intramuscular TID PRN Bobbitt, Shalon E, NP       haloperidol  lactate (HALDOL ) injection 10 mg  10 mg Intramuscular TID PRN Bobbitt, Shalon E, NP       And   diphenhydrAMINE  (BENADRYL ) injection 50 mg  50 mg Intramuscular TID PRN Bobbitt, Shalon E, NP       And   LORazepam  (ATIVAN ) injection 2 mg  2 mg Intramuscular TID PRN Bobbitt, Shalon E, NP       escitalopram  (LEXAPRO ) tablet 10 mg  10 mg Oral Daily Cleotilde Hoy HERO, NP   10 mg at 12/25/23 0805   feeding supplement (ENSURE PLUS HIGH PROTEIN) liquid 237 mL  237 mL Oral BID BM Jadapalle, Sree, MD   237 mL at 12/24/23 1444   hydrOXYzine  (ATARAX ) tablet 25 mg  25 mg Oral Q6H PRN  Bobbitt, Shalon E, NP       loperamide  (IMODIUM ) capsule 2-4 mg  2-4 mg Oral PRN Bobbitt, Shalon E, NP       LORazepam  (ATIVAN ) tablet 1 mg  1 mg Oral Q6H PRN Bobbitt, Shalon E, NP       LORazepam  (  ATIVAN ) tablet 1 mg  1 mg Oral TID Bobbitt, Shalon E, NP       Followed by   NOREEN ON 12/26/2023] LORazepam  (ATIVAN ) tablet 1 mg  1 mg Oral BID Bobbitt, Shalon E, NP       Followed by   NOREEN ON 12/28/2023] LORazepam  (ATIVAN ) tablet 1 mg  1 mg Oral Daily Bobbitt, Shalon E, NP       magnesium  hydroxide (MILK OF MAGNESIA) suspension 30 mL  30 mL Oral Daily PRN Bobbitt, Shalon E, NP       multivitamin with minerals tablet 1 tablet  1 tablet Oral Daily Bobbitt, Shalon E, NP   1 tablet at 12/25/23 0805   nicotine  (NICODERM CQ  - dosed in mg/24 hours) patch 21 mg  21 mg Transdermal Daily Jadapalle, Sree, MD       nicotine  polacrilex (NICORETTE ) gum 2 mg  2 mg Oral PRN Jadapalle, Sree, MD       ondansetron  (ZOFRAN -ODT) disintegrating tablet 4 mg  4 mg Oral Q6H PRN Bobbitt, Shalon E, NP       thiamine  (VITAMIN B1) tablet 100 mg  100 mg Oral Daily Bobbitt, Shalon E, NP   100 mg at 12/25/23 9193   traZODone  (DESYREL ) tablet 100 mg  100 mg Oral QHS Cleotilde Hoy HERO, NP   100 mg at 12/24/23 2137    Lab Results:  Results for orders placed or performed during the hospital encounter of 12/24/23 (from the past 48 hours)  Folate     Status: None   Collection Time: 12/23/23  1:38 PM  Result Value Ref Range   Folate 17.4 >5.9 ng/mL    Comment: Performed at [facility], [address] [place], [place] [address]  Lipid panel     Status: None   Collection Time: 12/25/23  6:45 AM  Result Value Ref Range   Cholesterol 181 0 - 200 mg/dL   Triglycerides 41 <849 mg/dL   HDL 878 >59 mg/dL   Total CHOL/HDL Ratio 1.5 RATIO   VLDL 8 0 - 40 mg/dL   LDL Cholesterol 52 0 - 99 mg/dL    Comment:        Total Cholesterol/HDL:CHD Risk Coronary Heart Disease Risk Table                     Men   Women  1/2  Average Risk   3.4   3.3  Average Risk       5.0   4.4  2 X Average Risk   9.6   7.1  3 X Average Risk  23.4   11.0        Use the calculated Patient Ratio above and the CHD Risk Table to determine the patient's CHD Risk.        ATP III CLASSIFICATION (LDL):  <100     mg/dL   Optimal  899-870  mg/dL   Near or Above                    Optimal  130-159  mg/dL   Borderline  839-810  mg/dL   High  >809     mg/dL   Very High Performed at [facility] [place], [address], [place], [place] [address]     Blood Alcohol level:  Lab Results  Component Value Date   ETH 268 (H) 12/23/2023   ETH <10 07/20/2022    Metabolic Disorder Labs: Lab Results  Component Value  Date   HGBA1C 4.4 12/22/2014   No results found for: PROLACTIN Lab Results  Component Value Date   CHOL 181 12/25/2023   TRIG 41 12/25/2023   HDL 121 12/25/2023   CHOLHDL 1.5 12/25/2023   VLDL 8 12/25/2023   LDLCALC 52 12/25/2023   LDLCALC 116 (H) 01/04/2023    Physical Findings: AIMS:  , ,  ,  ,    CIWA:  CIWA-Ar Total: 4 COWS:      Psychiatric Specialty Exam:  Presentation  General Appearance: No data recorded Eye Contact:No data recorded Speech:No data recorded Speech Volume:No data recorded   Mood and Affect  Mood:No data recorded Affect:No data recorded  Thought Process  Thought Processes:No data recorded Descriptions of Associations:No data recorded Orientation:No data recorded Thought Content:No data recorded Hallucinations:No data recorded Ideas of Reference:No data recorded Suicidal Thoughts:No data recorded Homicidal Thoughts:No data recorded  Sensorium  Memory:No data recorded Judgment:No data recorded Insight:No data recorded  Executive Functions  Concentration:No data recorded Attention Span:No data recorded Recall:No data recorded Fund of Knowledge:No data recorded Language:No data recorded  Psychomotor Activity  Psychomotor Activity:No data  recorded Musculoskeletal: Strength & Muscle Tone: within normal limits Gait & Station: normal Assets  Assets:No data recorded   Physical Exam: Physical Exam Vitals and nursing note reviewed.  HENT:     Head: Atraumatic.  Eyes:     Extraocular Movements: Extraocular movements intact.  Pulmonary:     Effort: Pulmonary effort is normal.  Neurological:     Mental Status: He is alert and oriented to person, place, and time.    Review of Systems  Psychiatric/Behavioral:  Positive for depression and suicidal ideas. Negative for hallucinations. The patient is nervous/anxious. The patient does not have insomnia.    Blood pressure 116/67, pulse (!) 101, temperature (!) 97.3 F (36.3 C), resp. rate (!) 21, height 5' 10 (1.778 m), weight 74.4 kg, SpO2 98%. Body mass index is 23.53 kg/m.  Diagnosis: Principal Problem:   MDD (major depressive disorder), recurrent episode, moderate (HCC)   PLAN: Safety and Monitoring:  -- Voluntary admission to inpatient psychiatric unit for safety, stabilization and treatment  -- Daily contact with patient to assess and evaluate symptoms and progress in treatment  -- Patient's case to be discussed in multi-disciplinary team meeting  -- Observation Level : q15 minute checks  -- Vital signs:  q12 hours  -- Precautions: suicide, elopement, and assault -- Encouraged patient to participate in unit milieu and in scheduled group therapies  2. Psychiatric Diagnoses and Treatment:    Patient presenting with significant mood decompensation in the context of multiple psychosocial stressors and recent relapse of alcohol, he is appropriate for inpatient psychiatric admission for medication management, symptom stabilization and safety.  Patient is tolerating taper well.  Plan: Resume Lexapro  10 mg PO daily and Trazodone  100 mg PO QHS, both previously effective. Continue Ativan  taper, thiamine , and multivitamin supplementation. Monitor on CIWA protocol for  alcohol withdrawal. Support engagement with substance abuse treatment planning and explore rehabilitation placement upon discharge.         -- The risks/benefits/side-effects/alternatives to this medication were discussed in detail with the patient and time was given for questions. The patient consents to medication trial.                -- Metabolic profile and EKG monitoring obtained while on an atypical antipsychotic (BMI: Lipid Panel: HbgA1c: QTc:)              -- Encouraged  patient to participate in unit milieu and in scheduled group therapies                   3. Medical Issues Being Addressed:   Continue CIWA  4. Discharge Planning:   -- Social work and case management to assist with discharge planning and identification of hospital follow-up needs prior to discharge  -- Estimated LOS: 3-4 days  Donnice FORBES Right, PA-C 12/25/2023, 1:29 PM

## 2023-12-26 DIAGNOSIS — F331 Major depressive disorder, recurrent, moderate: Secondary | ICD-10-CM | POA: Diagnosis not present

## 2023-12-26 LAB — HEMOGLOBIN A1C
Hgb A1c MFr Bld: 4.2 % — ABNORMAL LOW (ref 4.8–5.6)
Mean Plasma Glucose: 74 mg/dL

## 2023-12-26 MED ORDER — TRAZODONE HCL 50 MG PO TABS
75.0000 mg | ORAL_TABLET | Freq: Every day | ORAL | Status: DC
  Administered 2023-12-26 – 2023-12-29 (×6): 75 mg via ORAL
  Filled 2023-12-26 (×4): qty 2

## 2023-12-26 NOTE — Group Note (Signed)
 Recreation Therapy Group Note   Group Topic:General Recreation  Group Date: 12/26/2023 Start Time: 1000 End Time: 1110 Facilitators: Celestia Jeoffrey BRAVO, LRT, CTRS Location: Courtyard  Group Description: [employer]. LRT and patients played games of basketball, drew with chalk, and played corn hole while outside in the courtyard while getting fresh air and sunlight. Music was being played in the background. LRT and peers conversed about different games they have played before, what they do in their free time and anything else that is on their minds. LRT encouraged pts to drink water after being outside, sweating and getting their heart rate up.  Goal Area(s) Addressed: Patient will build on frustration tolerance skills. Patients will partake in a competitive play game with peers. Patients will gain knowledge of new leisure interest/hobby.    Affect/Mood: N/A   Participation Level: Did not attend    Clinical Observations/Individualized Feedback: Patient did not attend group.   Plan: Continue to engage patient in RT group sessions 2-3x/week.   Jeoffrey BRAVO Celestia, LRT, CTRS 12/26/2023 11:47 AM

## 2023-12-26 NOTE — Plan of Care (Signed)

## 2023-12-26 NOTE — Progress Notes (Signed)
 [facility] MD Progress Note  12/26/2023 10:06 AM Matthew [name]  MRN:  [id number]  Patient is a 63 year old male with a past psychiatric history of major depressive disorder and severe alcohol use disorder, presenting voluntarily for inpatient psychiatric treatment. He is currently on an Ativan  taper and receiving thiamine  and multivitamin supplementation.    Subjective:  Chart reviewed, case discussed in multidisciplinary meeting, patient seen during rounds.   8/12 On exam today he is alert and oriented. He is pleasant and cooperative. Denies current withdrawal. States that he plans to go back to work at [employer] home at discharge. Plans to stay with a friend or go to shelter at discharge. Denies SI today notes is first day without them. Affect is bright. Rates depression 4-5/10. Denies anxiety. Notes stable appetite and sleep. Decrease trazodone  to 75 mg at bedtime due to morning drowsiness. Most recent ciwa 2.   8/11 On exam today he is alert and oriented. He is pleasant and cooperative. Denies current withdrawal. Is future oriented with talks about going to [place]  to stay with family and start over. Notes passive si without plan or intent. Rates depression at 5/10. Denies anxiety. Notes stable appetite and sleep.   Sleep: Fair  Appetite:  Fair  Past Psychiatric History: see h&P Family History:  Family History  Problem Relation Age of Onset   Hypertension Mother    Other Father        unknown medical history   Diabetes Maternal Grandmother    Other Maternal Grandfather        unknown medical history   Other Paternal Grandmother        unknown medical history   Other Paternal Grandfather        unknown medical history   Social History:  Social History   Substance and Sexual Activity  Alcohol Use Yes   Alcohol/week: 105.0 standard drinks of alcohol   Types: 105 Cans of beer per week   Comment: 80 oz beer daily, 1/2 pint liquor daily, last use 07/19/22     Social History    Substance and Sexual Activity  Drug Use Not Currently   Types: Marijuana, Crack cocaine   Comment: last use mid 74s    Social History   Socioeconomic History   Marital status: Divorced    Spouse name: Not on file   Number of children: Not on file   Years of education: Not on file   Highest education level: Not on file  Occupational History   Occupation: Public affairs consultant at [employer]    Comment: pt afraid he's lost his employment  Tobacco Use   Smoking status: Every Day    Current packs/day: 0.50    Average packs/day: 0.5 packs/day for 35.0 years (17.5 ttl pk-yrs)    Types: Cigarettes   Smokeless tobacco: Never   Tobacco comments:    Patient is at [facility] for alcohol abuse  Vaping Use   Vaping status: Never Used  Substance and Sexual Activity   Alcohol use: Yes    Alcohol/week: 105.0 standard drinks of alcohol    Types: 105 Cans of beer per week    Comment: 80 oz beer daily, 1/2 pint liquor daily, last use 07/19/22   Drug use: Not Currently    Types: Marijuana, Crack cocaine    Comment: last use mid 1990s   Sexual activity: Not Currently    Birth control/protection: None  Other Topics Concern   Not on file  Social History Narrative  Not on file   Social Drivers of Health   Financial Resource Strain: Low Risk  (01/11/2023)   Overall Financial Resource Strain (CARDIA)    Difficulty of Paying Living Expenses: Not hard at all  Food Insecurity: No Food Insecurity (12/24/2023)   Hunger Vital Sign    Worried About Running Out of Food in the Last Year: Never true    Ran Out of Food in the Last Year: Never true  Transportation Needs: Unmet Transportation Needs (12/24/2023)   PRAPARE - Administrator, Civil Service (Medical): Yes    Lack of Transportation (Non-Medical): Yes  Physical Activity: Insufficiently Active (01/11/2023)   Exercise Vital Sign    Days of Exercise per Week: 2 days    Minutes of Exercise per Session: 20 min  Stress: No  Stress Concern Present (01/11/2023)   [employer] of Occupational Health - Occupational Stress Questionnaire    Feeling of Stress : Not at all  Social Connections: Socially Isolated (01/11/2023)   Social Connection and Isolation Panel    Frequency of Communication with Friends and Family: Three times a week    Frequency of Social Gatherings with Friends and Family: Three times a week    Attends Religious Services: Never    Active Member of Clubs or Organizations: No    Attends Banker Meetings: Never    Marital Status: Divorced   Past Medical History:  Past Medical History:  Diagnosis Date   Hypertension    Pneumothorax 12/15/2014   Substance abuse (HCC)    alcohol    Past Surgical History:  Procedure Laterality Date   APPENDECTOMY     COLONOSCOPY WITH PROPOFOL  N/A 10/28/2022   Procedure: COLONOSCOPY WITH PROPOFOL ;  Surgeon: Therisa Bi, MD;  Location: [facility] ENDOSCOPY;  Service: Gastroenterology;  Laterality: N/A;  REQUESTS ABOUT 9 AM ARRIVAL    Current Medications: Current Facility-Administered Medications  Medication Dose Route Frequency Provider Last Rate Last Admin   acetaminophen  (TYLENOL ) tablet 650 mg  650 mg Oral Q6H PRN Bobbitt, Shalon E, NP       alum & mag hydroxide-simeth (MAALOX/MYLANTA) 200-200-20 MG/5ML suspension 30 mL  30 mL Oral Q4H PRN Bobbitt, Shalon E, NP       haloperidol  (HALDOL ) tablet 5 mg  5 mg Oral TID PRN Bobbitt, Shalon E, NP       And   diphenhydrAMINE  (BENADRYL ) capsule 50 mg  50 mg Oral TID PRN Bobbitt, Shalon E, NP       haloperidol  lactate (HALDOL ) injection 5 mg  5 mg Intramuscular TID PRN Bobbitt, Shalon E, NP       And   diphenhydrAMINE  (BENADRYL ) injection 50 mg  50 mg Intramuscular TID PRN Bobbitt, Shalon E, NP       And   LORazepam  (ATIVAN ) injection 2 mg  2 mg Intramuscular TID PRN Bobbitt, Shalon E, NP       haloperidol  lactate (HALDOL ) injection 10 mg  10 mg Intramuscular TID PRN Bobbitt, Shalon E, NP       And    diphenhydrAMINE  (BENADRYL ) injection 50 mg  50 mg Intramuscular TID PRN Bobbitt, Shalon E, NP       And   LORazepam  (ATIVAN ) injection 2 mg  2 mg Intramuscular TID PRN Bobbitt, Shalon E, NP       escitalopram  (LEXAPRO ) tablet 10 mg  10 mg Oral Daily Cleotilde Hoy HERO, NP   10 mg at 12/26/23 0940   feeding supplement (ENSURE PLUS HIGH PROTEIN) liquid  237 mL  237 mL Oral BID BM Jadapalle, Sree, MD   237 mL at 12/26/23 0945   hydrOXYzine  (ATARAX ) tablet 25 mg  25 mg Oral Q6H PRN Bobbitt, Shalon E, NP       loperamide  (IMODIUM ) capsule 2-4 mg  2-4 mg Oral PRN Bobbitt, Shalon E, NP       LORazepam  (ATIVAN ) tablet 1 mg  1 mg Oral Q6H PRN Bobbitt, Shalon E, NP       LORazepam  (ATIVAN ) tablet 1 mg  1 mg Oral TID Bobbitt, Shalon E, NP   1 mg at 12/26/23 0940   Followed by   LORazepam  (ATIVAN ) tablet 1 mg  1 mg Oral BID Bobbitt, Shalon E, NP       Followed by   NOREEN ON 12/28/2023] LORazepam  (ATIVAN ) tablet 1 mg  1 mg Oral Daily Bobbitt, Shalon E, NP       magnesium  hydroxide (MILK OF MAGNESIA) suspension 30 mL  30 mL Oral Daily PRN Bobbitt, Shalon E, NP       multivitamin with minerals tablet 1 tablet  1 tablet Oral Daily Bobbitt, Shalon E, NP   1 tablet at 12/26/23 0940   nicotine  (NICODERM CQ  - dosed in mg/24 hours) patch 21 mg  21 mg Transdermal Daily Jadapalle, Sree, MD       nicotine  polacrilex (NICORETTE ) gum 2 mg  2 mg Oral PRN Jadapalle, Sree, MD       ondansetron  (ZOFRAN -ODT) disintegrating tablet 4 mg  4 mg Oral Q6H PRN Bobbitt, Shalon E, NP       thiamine  (VITAMIN B1) tablet 100 mg  100 mg Oral Daily Bobbitt, Shalon E, NP   100 mg at 12/26/23 0940   traZODone  (DESYREL ) tablet 75 mg  75 mg Oral QHS Jazzlene Huot E, PA-C        Lab Results:  Results for orders placed or performed during the hospital encounter of 12/24/23 (from the past 48 hours)  Lipid panel     Status: None   Collection Time: 12/25/23  6:45 AM  Result Value Ref Range   Cholesterol 181 0 - 200 mg/dL   Triglycerides  41 <849 mg/dL   HDL 878 >59 mg/dL   Total CHOL/HDL Ratio 1.5 RATIO   VLDL 8 0 - 40 mg/dL   LDL Cholesterol 52 0 - 99 mg/dL    Comment:        Total Cholesterol/HDL:CHD Risk Coronary Heart Disease Risk Table                     Men   Women  1/2 Average Risk   3.4   3.3  Average Risk       5.0   4.4  2 X Average Risk   9.6   7.1  3 X Average Risk  23.4   11.0        Use the calculated Patient Ratio above and the CHD Risk Table to determine the patient's CHD Risk.        ATP III CLASSIFICATION (LDL):  <100     mg/dL   Optimal  899-870  mg/dL   Near or Above                    Optimal  130-159  mg/dL   Borderline  839-810  mg/dL   High  >809     mg/dL   Very High Performed at [facility], [address], [place], [place]  [address]   Hemoglobin A1c     Status: Abnormal   Collection Time: 12/25/23  6:45 AM  Result Value Ref Range   Hgb A1c MFr Bld 4.2 (L) 4.8 - 5.6 %    Comment: (NOTE)         Prediabetes: 5.7 - 6.4         Diabetes: >6.4         Glycemic control for adults with diabetes: <7.0    Mean Plasma Glucose 74 mg/dL    Comment: (NOTE) Performed At: [facility] [address] [place], [place] [id number] Jennette Shorter MD Ey:[id number]     Blood Alcohol level:  Lab Results  Component Value Date   ETH 268 (H) 12/23/2023   ETH <10 07/20/2022    Metabolic Disorder Labs: Lab Results  Component Value Date   HGBA1C 4.2 (L) 12/25/2023   MPG 74 12/25/2023   No results found for: PROLACTIN Lab Results  Component Value Date   CHOL 181 12/25/2023   TRIG 41 12/25/2023   HDL 121 12/25/2023   CHOLHDL 1.5 12/25/2023   VLDL 8 12/25/2023   LDLCALC 52 12/25/2023   LDLCALC 116 (H) 01/04/2023    Physical Findings: AIMS:  , ,  ,  ,    CIWA:  CIWA-Ar Total: 2 COWS:      Psychiatric Specialty Exam:  Presentation  General Appearance: Casual  Eye Contact:Fair  Speech:Clear and Coherent  Speech Volume:Normal    Mood and Affect   Mood:Euthymic  Affect:Congruent   Thought Process  Thought Processes:Coherent  Descriptions of Associations:No data recorded Orientation:Full (Time, Place and Person)  Thought Content:No data recorded Hallucinations:No data recorded Ideas of Reference:No data recorded Suicidal Thoughts:No data recorded Homicidal Thoughts:No data recorded  Sensorium  Memory:Immediate Fair; Recent Fair  Judgment:No data recorded Insight:No data recorded  Executive Functions  Concentration:No data recorded Attention Span:No data recorded Recall:No data recorded Fund of Knowledge:No data recorded Language:No data recorded  Psychomotor Activity  Psychomotor Activity:No data recorded Musculoskeletal: Strength & Muscle Tone: within normal limits Gait & Station: normal Assets  Assets:Communication Skills; Desire for Improvement    Physical Exam: Physical Exam Vitals and nursing note reviewed.  HENT:     Head: Atraumatic.  Eyes:     Extraocular Movements: Extraocular movements intact.  Pulmonary:     Effort: Pulmonary effort is normal.  Neurological:     Mental Status: He is alert and oriented to person, place, and time.    Review of Systems  Psychiatric/Behavioral:  Positive for depression. Negative for hallucinations, substance abuse and suicidal ideas. The patient is nervous/anxious. The patient does not have insomnia.    Blood pressure 116/67, pulse (!) 101, temperature (!) 97.3 F (36.3 C), resp. rate (!) 21, height 5' 10 (1.778 m), weight 74.4 kg, SpO2 98%. Body mass index is 23.53 kg/m.  Diagnosis: Principal Problem:   MDD (major depressive disorder), recurrent episode, moderate (HCC)   PLAN: Safety and Monitoring:  -- Voluntary admission to inpatient psychiatric unit for safety, stabilization and treatment  -- Daily contact with patient to assess and evaluate symptoms and progress in treatment  -- Patient's case to be discussed in multi-disciplinary team  meeting  -- Observation Level : q15 minute checks  -- Vital signs:  q12 hours  -- Precautions: suicide, elopement, and assault -- Encouraged patient to participate in unit milieu and in scheduled group therapies  2. Psychiatric Diagnoses and Treatment:    Patient presenting with significant mood decompensation in the context  of multiple psychosocial stressors and recent relapse of alcohol, he is appropriate for inpatient psychiatric admission for medication management, symptom stabilization and safety.  Patient is tolerating taper well.  Plan: Cotninue Lexapro  10 mg PO daily  derease Trazodone  to 75 mg PO QHS, Continue Ativan  taper, thiamine , and multivitamin supplementation. Monitor on CIWA protocol for alcohol withdrawal. Support engagement with substance abuse treatment planning and explore rehabilitation placement upon discharge.         -- The risks/benefits/side-effects/alternatives to this medication were discussed in detail with the patient and time was given for questions. The patient consents to medication trial.                -- Metabolic profile and EKG monitoring obtained while on an atypical antipsychotic (BMI: Lipid Panel: HbgA1c: QTc:)              -- Encouraged patient to participate in unit milieu and in scheduled group therapies                   3. Medical Issues Being Addressed:   Continue CIWA  4. Discharge Planning:   -- Social work and case management to assist with discharge planning and identification of hospital follow-up needs prior to discharge  -- Estimated LOS: 3-4 days  Donnice FORBES Right, PA-C 12/26/2023, 10:06 AM

## 2023-12-26 NOTE — Progress Notes (Signed)
 Pt alert, oriented, and pleasant. Pt denies SIHI, AVH, anxiety, depression, and hopelessness. Pt complains of a headache 9/10 that comes and goes. Pt requested Tylenol  for HA and reports improvement shortly after taking medication.   12/26/23 1951  Psych Admission Type (Psych Patients Only)  Admission Status Voluntary  Psychosocial Assessment  Patient Complaints None  Eye Contact Fair  Facial Expression Animated  Affect Appropriate to circumstance  Speech Logical/coherent  Interaction Assertive  Motor Activity Slow  Appearance/Hygiene Unremarkable  Behavior Characteristics Cooperative;Calm  Mood Pleasant  Aggressive Behavior  Effect No apparent injury  Thought Process  Coherency WDL  Content WDL  Delusions None reported or observed  Perception WDL  Hallucination None reported or observed  Judgment WDL  Confusion None  Danger to Self  Current suicidal ideation? Denies  Self-Injurious Behavior No self-injurious ideation or behavior indicators observed or expressed   Agreement Not to Harm Self Yes  Description of Agreement Verbal  Danger to Others  Danger to Others None reported or observed

## 2023-12-26 NOTE — Group Note (Signed)
 Date:  12/26/2023 Time:  11:28 PM  Group Topic/Focus:  Wrap-Up Group:   The focus of this group is to help patients review their daily goal of treatment and discuss progress on daily workbooks.    Participation Level:  Active  Participation Quality:  Appropriate and Attentive  Affect:  Appropriate  Cognitive:  Alert and Appropriate  Insight: Appropriate  Engagement in Group:  Engaged  Modes of Intervention:  Discussion and Orientation  Additional Comments:     Arlester CHRISTELLA Servant 12/26/2023, 11:28 PM

## 2023-12-26 NOTE — Group Note (Signed)
 Date:  12/26/2023 Time:  10:04 AM  Group Topic/Focus:  Goals Group:   The focus of this group is to help patients establish daily goals to achieve during treatment and discuss how the patient can incorporate goal setting into their daily lives to aide in recovery.    Participation Level:  Did Not Attend   [name] 12/26/2023, 10:04 AM

## 2023-12-26 NOTE — Group Note (Signed)
 [facility] LCSW Group Therapy Note   Group Date: 12/26/2023 Start Time: 1300 End Time: 1345  Type of Therapy/Topic:  Group Therapy:  Feelings about Diagnosis  Participation Level:  Did Not Attend    Description of Group:    This group will allow patients to explore their thoughts and feelings about diagnoses they have received. Patients will be guided to explore their level of understanding and acceptance of these diagnoses. Facilitator will encourage patients to process their thoughts and feelings about the reactions of others to their diagnosis, and will guide patients in identifying ways to discuss their diagnosis with significant others in their lives. This group will be process-oriented, with patients participating in exploration of their own experiences as well as giving and receiving support and challenge from other group members.   Therapeutic Goals: 1. Patient will demonstrate understanding of diagnosis as evidence by identifying two or more symptoms of the disorder:  2. Patient will be able to express two feelings regarding the diagnosis 3. Patient will demonstrate ability to communicate their needs through discussion and/or role plays  Summary of Patient Progress: X   Therapeutic Modalities:   Cognitive Behavioral Therapy Brief Therapy Feelings Identification    Nadara JONELLE Fam, LCSW

## 2023-12-26 NOTE — Progress Notes (Signed)
   12/26/23 1100  Psych Admission Type (Psych Patients Only)  Admission Status Voluntary  Psychosocial Assessment  Patient Complaints Depression  Eye Contact Fair  Facial Expression Animated  Affect Appropriate to circumstance  Speech Logical/coherent;Soft  Interaction Minimal  Motor Activity Other (Comment) (appropriate for developmental age)  Appearance/Hygiene Unremarkable  Behavior Characteristics Cooperative  Mood Pleasant  Thought Process  Coherency WDL  Content WDL  Delusions None reported or observed  Perception WDL  Hallucination None reported or observed  Judgment WDL  Confusion None  Danger to Self  Current suicidal ideation? Denies  Self-Injurious Behavior No self-injurious ideation or behavior indicators observed or expressed   Danger to Others  Danger to Others None reported or observed

## 2023-12-26 NOTE — Plan of Care (Signed)
  Problem: Activity: Goal: Interest or engagement in activities will improve Outcome: Progressing Goal: Sleeping patterns will improve Outcome: Progressing   Problem: Safety: Goal: Periods of time without injury will increase Outcome: Progressing

## 2023-12-26 NOTE — Group Note (Signed)
 Recreation Therapy Group Note   Group Topic:Coping Skills  Group Date: 12/26/2023 Start Time: 1530 End Time: 1620 Facilitators: Celestia Jeoffrey FORBES ARTICE, CTRS Location: Craft Room  Group Description: Coping A-Z. LRT and patients engage in a guided discussion on what coping skills are and gave specific examples. LRT passed out a handout labeled Coping A-Z with blank spaces beside each letter. LRT prompted patients to come up with a coping skill for each of the letters. LRT and patients went over the handout and gave ideas for each letter if anyone had any blanks left on their paper. Patients kept this handout with them that listed 26 different coping skills.   Goal Area(s) Addressed: Patients will be able to define "coping skills". Patient will identify new coping skills.  Patient will increase communication.   Affect/Mood: N/A   Participation Level: Did not attend    Clinical Observations/Individualized Feedback: Patient did not attend group.   Plan: Continue to engage patient in RT group sessions 2-3x/week.   Jeoffrey FORBES Celestia, LRT, CTRS 12/26/2023 4:52 PM

## 2023-12-27 ENCOUNTER — Telehealth: Payer: Self-pay | Admitting: Gerontology

## 2023-12-27 DIAGNOSIS — F331 Major depressive disorder, recurrent, moderate: Secondary | ICD-10-CM | POA: Diagnosis not present

## 2023-12-27 NOTE — Group Note (Signed)
 Date:  12/27/2023 Time:  9:00 PM  Group Topic/Focus:  Goals Group:   The focus of this group is to help patients establish daily goals to achieve during treatment and discuss how the patient can incorporate goal setting into their daily lives to aide in recovery.    Participation Level:  Active  Participation Quality:  Appropriate  Affect:  Appropriate  Cognitive:  Appropriate  Insight: Appropriate  Engagement in Group:  Engaged  Modes of Intervention:  Activity  Additional Comments:    Taylour Lietzke 12/27/2023, 9:00 PM

## 2023-12-27 NOTE — Plan of Care (Signed)

## 2023-12-27 NOTE — Group Note (Signed)
 LCSW Group Therapy Note   Group Date: 12/27/2023 Start Time: 1300 End Time: 1400   Type of Therapy and Topic:  Group Therapy: Challenging Core Beliefs  Participation Level:  Active  Description of Group:  Patients were educated about core beliefs and asked to identify one harmful core belief that they have. Patients were asked to explore from where those beliefs originate. Patients were asked to discuss how those beliefs make them feel and the resulting behaviors of those beliefs. They were then be asked if those beliefs are true and, if so, what evidence they have to support them. Lastly, group members were challenged to replace those negative core beliefs with helpful beliefs.   Therapeutic Goals:   1. Patient will identify harmful core beliefs and explore the origins of such beliefs. 2. Patient will identify feelings and behaviors that result from those core beliefs. 3. Patient will discuss whether such beliefs are true. 4.  Patient will replace harmful core beliefs with helpful ones.  Summary of Patient Progress:  Patient actively engaged in processing and exploring how core beliefs are formed and how they impact thoughts, feelings, and behaviors. Patient proved open to input from peers and feedback from CSW. Patient demonstrated proficient  insight into the subject matter, was respectful and supportive of peers, and participated throughout the entire session.  Therapeutic Modalities: Cognitive Behavioral Therapy; Solution-Focused Therapy   Matthew [name] [name], Matthew [name] 12/27/2023  3:06 PM

## 2023-12-27 NOTE — Progress Notes (Signed)
   12/27/23 0843  Psych Admission Type (Psych Patients Only)  Admission Status Voluntary  Psychosocial Assessment  Patient Complaints None  Eye Contact Fair  Facial Expression Animated  Affect Appropriate to circumstance  Speech Logical/coherent  Interaction Assertive  Motor Activity Slow  Appearance/Hygiene Unremarkable  Behavior Characteristics Cooperative;Calm  Mood Pleasant  Aggressive Behavior  Effect No apparent injury  Thought Process  Coherency WDL  Content WDL  Delusions None reported or observed  Perception WDL  Hallucination None reported or observed  Judgment WDL  Confusion None  Danger to Self  Current suicidal ideation? Denies  Description of Suicide Plan no plan  Self-Injurious Behavior No self-injurious ideation or behavior indicators observed or expressed   Agreement Not to Harm Self Yes  Description of Agreement verbal  Danger to Others  Danger to Others None reported or observed

## 2023-12-27 NOTE — Progress Notes (Signed)
   12/27/23 2200  Psych Admission Type (Psych Patients Only)  Admission Status Voluntary  Psychosocial Assessment  Patient Complaints None  Eye Contact Fair  Facial Expression Animated  Affect Appropriate to circumstance  Speech Logical/coherent  Interaction Assertive  Motor Activity Slow  Appearance/Hygiene Unremarkable  Behavior Characteristics Cooperative;Calm  Mood Pleasant  Thought Process  Coherency WDL  Content WDL  Delusions None reported or observed  Perception WDL  Hallucination None reported or observed  Judgment WDL  Confusion None  Danger to Self  Current suicidal ideation? Denies  Self-Injurious Behavior No self-injurious ideation or behavior indicators observed or expressed   Agreement Not to Harm Self Yes  Description of Agreement Verbal  Danger to Others  Danger to Others None reported or observed

## 2023-12-27 NOTE — Progress Notes (Signed)
 [facility] MD Progress Note  12/27/2023 12:57 PM Matthew [name]  MRN:  [id number]  Patient is a 63 year old male with a past psychiatric history of major depressive disorder and severe alcohol use disorder, presenting voluntarily for inpatient psychiatric treatment. He is currently on an Ativan  taper and receiving thiamine  and multivitamin supplementation.    Subjective:  Chart reviewed, case discussed in multidisciplinary meeting, patient seen during rounds.   8/13: Patient seen for follow up they are alert and oriented. They are pleasant and cooperative. The are working with their daughter to see if they can get assistance moving. Report mood is significantly improved. State time to move forward. Discusses that he has been down before and knows he can pick himself up. Rates depression 2/10 Denies SI/HI/AVH. Notes stable appetite and sleep. Is bathing and having regular bowel movements. Has been going to group and participating in the milieu. Is tolerating medications well denies any adverse effects. Voices no concerns or complaints. Denies withdrawal, taper completes tomorrow.  8/12 On exam today he is alert and oriented. He is pleasant and cooperative. Denies current withdrawal. States that he plans to go back to work at [employer] home at discharge. Plans to stay with a friend or go to shelter at discharge. Denies SI today notes is first day without them. Affect is bright. Rates depression 4-5/10. Denies anxiety. Notes stable appetite and sleep. Decrease trazodone  to 75 mg at bedtime due to morning drowsiness. Most recent ciwa 2.   8/11 On exam today he is alert and oriented. He is pleasant and cooperative. Denies current withdrawal. Is future oriented with talks about going to [place]  to stay with family and start over. Notes passive si without plan or intent. Rates depression at 5/10. Denies anxiety. Notes stable appetite and sleep.   Sleep: Fair  Appetite:  Fair  Past Psychiatric History:  see h&P Family History:  Family History  Problem Relation Age of Onset   Hypertension Mother    Other Father        unknown medical history   Diabetes Maternal Grandmother    Other Maternal Grandfather        unknown medical history   Other Paternal Grandmother        unknown medical history   Other Paternal Grandfather        unknown medical history   Social History:  Social History   Substance and Sexual Activity  Alcohol Use Yes   Alcohol/week: 105.0 standard drinks of alcohol   Types: 105 Cans of beer per week   Comment: 80 oz beer daily, 1/2 pint liquor daily, last use 07/19/22     Social History   Substance and Sexual Activity  Drug Use Not Currently   Types: Marijuana, Crack cocaine   Comment: last use mid 19s    Social History   Socioeconomic History   Marital status: Divorced    Spouse name: Not on file   Number of children: Not on file   Years of education: Not on file   Highest education level: Not on file  Occupational History   Occupation: Public affairs consultant at [employer]    Comment: pt afraid he's lost his employment  Tobacco Use   Smoking status: Every Day    Current packs/day: 0.50    Average packs/day: 0.5 packs/day for 35.0 years (17.5 ttl pk-yrs)    Types: Cigarettes   Smokeless tobacco: Never   Tobacco comments:    Patient is at [facility] for alcohol  abuse  Vaping Use   Vaping status: Never Used  Substance and Sexual Activity   Alcohol use: Yes    Alcohol/week: 105.0 standard drinks of alcohol    Types: 105 Cans of beer per week    Comment: 80 oz beer daily, 1/2 pint liquor daily, last use 07/19/22   Drug use: Not Currently    Types: Marijuana, Crack cocaine    Comment: last use mid 1990s   Sexual activity: Not Currently    Birth control/protection: None  Other Topics Concern   Not on file  Social History Narrative   Not on file   Social Drivers of Health   Financial Resource Strain: Low Risk  (01/11/2023)   Overall  Financial Resource Strain (CARDIA)    Difficulty of Paying Living Expenses: Not hard at all  Food Insecurity: No Food Insecurity (12/24/2023)   Hunger Vital Sign    Worried About Running Out of Food in the Last Year: Never true    Ran Out of Food in the Last Year: Never true  Transportation Needs: Unmet Transportation Needs (12/24/2023)   PRAPARE - Transportation    Lack of Transportation (Medical): Yes    Lack of Transportation (Non-Medical): Yes  Physical Activity: Insufficiently Active (01/11/2023)   Exercise Vital Sign    Days of Exercise per Week: 2 days    Minutes of Exercise per Session: 20 min  Stress: No Stress Concern Present (01/11/2023)   [employer] of Occupational Health - Occupational Stress Questionnaire    Feeling of Stress : Not at all  Social Connections: Socially Isolated (01/11/2023)   Social Connection and Isolation Panel    Frequency of Communication with Friends and Family: Three times a week    Frequency of Social Gatherings with Friends and Family: Three times a week    Attends Religious Services: Never    Active Member of Clubs or Organizations: No    Attends Banker Meetings: Never    Marital Status: Divorced   Past Medical History:  Past Medical History:  Diagnosis Date   Hypertension    Pneumothorax 12/15/2014   Substance abuse (HCC)    alcohol    Past Surgical History:  Procedure Laterality Date   APPENDECTOMY     COLONOSCOPY WITH PROPOFOL  N/A 10/28/2022   Procedure: COLONOSCOPY WITH PROPOFOL ;  Surgeon: Therisa Bi, MD;  Location: [facility] ENDOSCOPY;  Service: Gastroenterology;  Laterality: N/A;  REQUESTS ABOUT 9 AM ARRIVAL    Current Medications: Current Facility-Administered Medications  Medication Dose Route Frequency Provider Last Rate Last Admin   acetaminophen  (TYLENOL ) tablet 650 mg  650 mg Oral Q6H PRN Bobbitt, Shalon E, NP   650 mg at 12/26/23 2013   alum & mag hydroxide-simeth (MAALOX/MYLANTA) 200-200-20 MG/5ML suspension  30 mL  30 mL Oral Q4H PRN Bobbitt, Shalon E, NP       haloperidol  (HALDOL ) tablet 5 mg  5 mg Oral TID PRN Bobbitt, Shalon E, NP       And   diphenhydrAMINE  (BENADRYL ) capsule 50 mg  50 mg Oral TID PRN Bobbitt, Shalon E, NP       haloperidol  lactate (HALDOL ) injection 5 mg  5 mg Intramuscular TID PRN Bobbitt, Shalon E, NP       And   diphenhydrAMINE  (BENADRYL ) injection 50 mg  50 mg Intramuscular TID PRN Bobbitt, Shalon E, NP       And   LORazepam  (ATIVAN ) injection 2 mg  2 mg Intramuscular TID PRN Bobbitt, Shalon E, NP  haloperidol  lactate (HALDOL ) injection 10 mg  10 mg Intramuscular TID PRN Bobbitt, Shalon E, NP       And   diphenhydrAMINE  (BENADRYL ) injection 50 mg  50 mg Intramuscular TID PRN Bobbitt, Shalon E, NP       And   LORazepam  (ATIVAN ) injection 2 mg  2 mg Intramuscular TID PRN Bobbitt, Shalon E, NP       escitalopram  (LEXAPRO ) tablet 10 mg  10 mg Oral Daily Cleotilde Hoy HERO, NP   10 mg at 12/27/23 0843   feeding supplement (ENSURE PLUS HIGH PROTEIN) liquid 237 mL  237 mL Oral BID BM Jadapalle, Sree, MD   237 mL at 12/27/23 0842   [START ON 12/28/2023] LORazepam  (ATIVAN ) tablet 1 mg  1 mg Oral Daily Bobbitt, Shalon E, NP       magnesium  hydroxide (MILK OF MAGNESIA) suspension 30 mL  30 mL Oral Daily PRN Bobbitt, Shalon E, NP       multivitamin with minerals tablet 1 tablet  1 tablet Oral Daily Bobbitt, Shalon E, NP   1 tablet at 12/27/23 9157   nicotine  (NICODERM CQ  - dosed in mg/24 hours) patch 21 mg  21 mg Transdermal Daily Jadapalle, Sree, MD       nicotine  polacrilex (NICORETTE ) gum 2 mg  2 mg Oral PRN Jadapalle, Sree, MD       thiamine  (VITAMIN B1) tablet 100 mg  100 mg Oral Daily Bobbitt, Shalon E, NP   100 mg at 12/27/23 0843   traZODone  (DESYREL ) tablet 75 mg  75 mg Oral QHS Romie Tay E, PA-C   75 mg at 12/26/23 2052    Lab Results:  No results found for this or any previous visit (from the past 48 hours).   Blood Alcohol level:  Lab Results   Component Value Date   ETH 268 (H) 12/23/2023   ETH <10 07/20/2022    Metabolic Disorder Labs: Lab Results  Component Value Date   HGBA1C 4.2 (L) 12/25/2023   MPG 74 12/25/2023   No results found for: PROLACTIN Lab Results  Component Value Date   CHOL 181 12/25/2023   TRIG 41 12/25/2023   HDL 121 12/25/2023   CHOLHDL 1.5 12/25/2023   VLDL 8 12/25/2023   LDLCALC 52 12/25/2023   LDLCALC 116 (H) 01/04/2023    Physical Findings: AIMS:  , ,  ,  ,    CIWA:  CIWA-Ar Total: 0 COWS:      Psychiatric Specialty Exam:  Presentation  General Appearance: Casual  Eye Contact:Fair  Speech:Clear and Coherent  Speech Volume:Normal    Mood and Affect  Mood:Euthymic  Affect:Congruent   Thought Process  Thought Processes:Coherent  Descriptions of Associations:No data recorded Orientation:Full (Time, Place and Person)  Thought Content:No data recorded Hallucinations:No data recorded Ideas of Reference:No data recorded Suicidal Thoughts:Suicidal Thoughts: No  Homicidal Thoughts:Homicidal Thoughts: No   Sensorium  Memory:Immediate Fair; Recent Fair  Judgment:Good  Insight:Good   Executive Functions  Concentration:Good  Attention Span:No data recorded Recall:No data recorded Fund of Knowledge:No data recorded Language:No data recorded  Psychomotor Activity  Psychomotor Activity:Psychomotor Activity: Normal  Musculoskeletal: Strength & Muscle Tone: within normal limits Gait & Station: normal Assets  Assets:Communication Skills; Desire for Improvement    Physical Exam: Physical Exam Vitals and nursing note reviewed.  HENT:     Head: Atraumatic.  Eyes:     Extraocular Movements: Extraocular movements intact.  Pulmonary:     Effort: Pulmonary effort is normal.  Neurological:  Mental Status: He is alert and oriented to person, place, and time.    Review of Systems  Psychiatric/Behavioral:  Positive for depression. Negative for  hallucinations, substance abuse and suicidal ideas. The patient is nervous/anxious. The patient does not have insomnia.    Blood pressure 133/67, pulse (!) 59, temperature 97.7 F (36.5 C), resp. rate 17, height 5' 10 (1.778 m), weight 74.4 kg, SpO2 97%. Body mass index is 23.53 kg/m.  Diagnosis: Principal Problem:   MDD (major depressive disorder), recurrent episode, moderate (HCC)   PLAN: Safety and Monitoring:  -- Voluntary admission to inpatient psychiatric unit for safety, stabilization and treatment  -- Daily contact with patient to assess and evaluate symptoms and progress in treatment  -- Patient's case to be discussed in multi-disciplinary team meeting  -- Observation Level : q15 minute checks  -- Vital signs:  q12 hours  -- Precautions: suicide, elopement, and assault -- Encouraged patient to participate in unit milieu and in scheduled group therapies  2. Psychiatric Diagnoses and Treatment:   Patent trending up likely discharge Saturday, has court on Monday, plans to stay with a friend temporaril.   Plan: Cotninue Lexapro  10 mg PO daily  Continue Trazodone  to 75 mg PO QHS, Continue Ativan  taper, thiamine , and multivitamin supplementation. Monitor on CIWA protocol for alcohol withdrawal. Support engagement with substance abuse treatment planning and explore rehabilitation placement upon discharge.         -- The risks/benefits/side-effects/alternatives to this medication were discussed in detail with the patient and time was given for questions. The patient consents to medication trial.                -- Metabolic profile and EKG monitoring obtained while on an atypical antipsychotic (BMI: Lipid Panel: HbgA1c: QTc:)              -- Encouraged patient to participate in unit milieu and in scheduled group therapies                   3. Medical Issues Being Addressed:   Taper completes tomorrow, recent ciwa 0  4. Discharge Planning:   -- Social work and case  management to assist with discharge planning and identification of hospital follow-up needs prior to discharge  -- Estimated LOS: 3-4 days  Donnice FORBES Right, PA-C 12/27/2023, 12:57 PM

## 2023-12-27 NOTE — Group Note (Signed)
 Date:  12/27/2023 Time:  4:11 PM  Group Topic/Focus:  Activity Group: The focus of the group is to promote activity for the patients and encourage them to go outside to the courtyard and get some fresh air and some exercise.    Participation Level:  Active  Participation Quality:  Appropriate  Affect:  Appropriate  Cognitive:  Appropriate  Insight: Appropriate  Engagement in Group:  Engaged  Modes of Intervention:  Activity  Additional Comments:    Matthew [name] Matthew [name] 12/27/2023, 4:11 PM

## 2023-12-27 NOTE — Group Note (Signed)
 Date:  12/27/2023 Time:  12:59 PM  Group Topic/Focus:  Building Self Esteem:   The Focus of this group is helping patients become aware of the effects of self-esteem on their lives, the things they and others do that enhance or undermine their self-esteem, seeing the relationship between their level of self-esteem and the choices they make and learning ways to enhance self-esteem.    Participation Level:  Did Not Attend   Matthew [name] 12/27/2023, 12:59 PM

## 2023-12-28 DIAGNOSIS — F331 Major depressive disorder, recurrent, moderate: Secondary | ICD-10-CM | POA: Diagnosis not present

## 2023-12-28 NOTE — Group Note (Signed)
 LCSW Group Therapy Note   Group Date: 12/28/2023 Start Time: 0900 End Time: 1000   Type of Therapy and Topic:  Group Therapy: Boundaries  Participation Level:  None  Description of Group: This group will address the use of boundaries in their personal lives. Patients will explore why boundaries are important, the difference between healthy and unhealthy boundaries, and negative and postive outcomes of different boundaries and will look at how boundaries can be crossed.  Patients will be encouraged to identify current boundaries in their own lives and identify what kind of boundary is being set. Facilitators will guide patients in utilizing problem-solving interventions to address and correct types boundaries being used and to address when no boundary is being used. Understanding and applying boundaries will be explored and addressed for obtaining and maintaining a balanced life. Patients will be encouraged to explore ways to assertively make their boundaries and needs known to significant others in their lives, using other group members and facilitator for role play, support, and feedback.  Therapeutic Goals:  1.  Patient will identify areas in their life where setting clear boundaries could be  used to improve their life.  2.  Patient will identify signs/triggers that a boundary is not being respected. 3.  Patient will identify two ways to set boundaries in order to achieve balance in  their lives: 4.  Patient will demonstrate ability to communicate their needs and set boundaries  through discussion and/or role plays  Summary of Patient Progress:   Patient was present in group.  Patient appeared attentive though did not engage in group discussion.     Therapeutic Modalities:   Cognitive Behavioral Therapy Solution-Focused Therapy  Sherryle JINNY Margo, LCSW 12/28/2023  10:34 AM

## 2023-12-28 NOTE — Plan of Care (Signed)
  Problem: Education: Goal: Emotional status will improve Outcome: Progressing Goal: Mental status will improve Outcome: Progressing Goal: Verbalization of understanding the information provided will improve Outcome: Progressing   Problem: Activity: Goal: Sleeping patterns will improve Outcome: Progressing   Problem: Coping: Goal: Ability to demonstrate self-control will improve Outcome: Progressing   Problem: Health Behavior/Discharge Planning: Goal: Compliance with treatment plan for underlying cause of condition will improve Outcome: Progressing   Problem: Physical Regulation: Goal: Ability to maintain clinical measurements within normal limits will improve Outcome: Progressing   Problem: Safety: Goal: Periods of time without injury will increase Outcome: Progressing

## 2023-12-28 NOTE — Group Note (Signed)
 Recreation Therapy Group Note   Group Topic:Healthy Support Systems  Group Date: 12/28/2023 Start Time: 1530 End Time: 1615 Facilitators: Celestia Jeoffrey FORBES ARTICE, CTRS Location: Craft Room  Group Description: Straw Bridge. In groups or individually, patients were given 10 plastic drinking straws and an equal length of masking tape. Using the materials provided, patients were instructed to build a free-standing bridge-like structure to suspend an everyday item (ex: deck of cards) off the floor or table surface. All materials were required to be used in Secondary school teacher. LRT facilitated post-activity discussion reviewing the importance of having strong and healthy support systems in our lives. LRT discussed how the people in our lives serve as the tape and the deck of cards we placed on top of our straw structure are the stressors we face in daily life. LRT and pts discussed what happens in our life when things get too heavy for us , and we don't have strong supports outside of the hospital. Pt shared 2 of their healthy supports in their life aloud in the group.   Goal Area(s) Addressed:  Patient will identify 2 healthy supports in their life. Patient will identify skills to successfully complete activity. Patient will identify correlation of this activity to life post-discharge.  Patient will build on frustration tolerance skills. Patient will increase team building and communication skills.   Affect/Mood: Appropriate   Participation Level: Active and Engaged   Participation Quality: Independent   Behavior: Appropriate, Calm, and Cooperative   Speech/Thought Process: Coherent   Insight: Good   Judgement: Good   Modes of Intervention: STEM Activity   Patient Response to Interventions:  Attentive, Engaged, Interested , and Receptive   Education Outcome:  Acknowledges education   Clinical Observations/Individualized Feedback: Matthew [name] was active in their participation of session activities  and group discussion. Pt identified my ex wife and ex girlfriend as healthy supports.    Plan: Continue to engage patient in RT group sessions 2-3x/week.   Jeoffrey FORBES Celestia, LRT, CTRS 12/28/2023 5:38 PM

## 2023-12-28 NOTE — Plan of Care (Signed)
   Problem: Education: Goal: Knowledge of [facility] Education information/materials will improve Outcome: Progressing Goal: Emotional status will improve Outcome: Progressing Goal: Mental status will improve Outcome: Progressing Goal: Verbalization of understanding the information provided will improve Outcome: Progressing

## 2023-12-28 NOTE — Progress Notes (Signed)
   12/28/23 1300  Psych Admission Type (Psych Patients Only)  Admission Status Voluntary  Psychosocial Assessment  Patient Complaints None  Eye Contact Fair  Facial Expression Animated  Affect Appropriate to circumstance  Speech Logical/coherent  Interaction Assertive  Motor Activity Slow  Appearance/Hygiene Unremarkable  Behavior Characteristics Calm;Cooperative  Mood Pleasant  Thought Process  Coherency WDL  Content WDL  Delusions None reported or observed  Perception WDL  Hallucination None reported or observed  Judgment WDL  Confusion None  Danger to Self  Current suicidal ideation? Denies  Self-Injurious Behavior No self-injurious ideation or behavior indicators observed or expressed   Agreement Not to Harm Self Yes  Description of Agreement Verbal  Danger to Others  Danger to Others None reported or observed   Koren participated in group activities and was present in the milieu throughout the day. Pleasant upon approach, and cooperative with treatment plan. Looks forward to his upcoming discharge.

## 2023-12-28 NOTE — Progress Notes (Signed)
 [facility] MD Progress Note  12/28/2023 4:17 PM Matthew [name]  MRN:  [id number]  Patient is a 63 year old male with a past psychiatric history of major depressive disorder and severe alcohol use disorder, presenting voluntarily for inpatient psychiatric treatment. He is currently on an Ativan  taper and receiving thiamine  and multivitamin supplementation.    Subjective:  Chart reviewed, case discussed in multidisciplinary meeting, patient seen during rounds.   8/14: Patient seen for follow-up.  They are alert and oriented.  They are pleasant and cooperative.  They are linear logical and future oriented.  They deny SI, HI, and AVH.  They deny withdrawal symptoms.  Taper is complete.  They have been medication compliant.  They have not required any behavioral PRNs.  They note stable mood appetite and sleep.  They rated depression at 0 out of 10.  They are performing ADLs.  They believe that they can go stay with a friend at discharge.  They have court Monday morning.  Discussed we will plan for discharge on Saturday.  8/13: Patient seen for follow up they are alert and oriented. They are pleasant and cooperative. The are working with their daughter to see if they can get assistance moving. Report mood is significantly improved. State time to move forward. Discusses that he has been down before and knows he can pick himself up. Rates depression 2/10 Denies SI/HI/AVH. Notes stable appetite and sleep. Is bathing and having regular bowel movements. Has been going to group and participating in the milieu. Is tolerating medications well denies any adverse effects. Voices no concerns or complaints. Denies withdrawal, taper completes tomorrow.  8/12 On exam today he is alert and oriented. He is pleasant and cooperative. Denies current withdrawal. States that he plans to go back to work at [employer] home at discharge. Plans to stay with a friend or go to shelter at discharge. Denies SI today notes is first  day without them. Affect is bright. Rates depression 4-5/10. Denies anxiety. Notes stable appetite and sleep. Decrease trazodone  to 75 mg at bedtime due to morning drowsiness. Most recent ciwa 2.   8/11 On exam today he is alert and oriented. He is pleasant and cooperative. Denies current withdrawal. Is future oriented with talks about going to [place]  to stay with family and start over. Notes passive si without plan or intent. Rates depression at 5/10. Denies anxiety. Notes stable appetite and sleep.   Sleep: Fair  Appetite:  Fair  Past Psychiatric History: see h&P Family History:  Family History  Problem Relation Age of Onset   Hypertension Mother    Other Father        unknown medical history   Diabetes Maternal Grandmother    Other Maternal Grandfather        unknown medical history   Other Paternal Grandmother        unknown medical history   Other Paternal Grandfather        unknown medical history   Social History:  Social History   Substance and Sexual Activity  Alcohol Use Yes   Alcohol/week: 105.0 standard drinks of alcohol   Types: 105 Cans of beer per week   Comment: 80 oz beer daily, 1/2 pint liquor daily, last use 07/19/22     Social History   Substance and Sexual Activity  Drug Use Not Currently   Types: Marijuana, Crack cocaine   Comment: last use mid 74s    Social History   Socioeconomic History   Marital status:  Divorced    Spouse name: Not on file   Number of children: Not on file   Years of education: Not on file   Highest education level: Not on file  Occupational History   Occupation: Public affairs consultant at [employer]    Comment: pt afraid he's lost his employment  Tobacco Use   Smoking status: Every Day    Current packs/day: 0.50    Average packs/day: 0.5 packs/day for 35.0 years (17.5 ttl pk-yrs)    Types: Cigarettes   Smokeless tobacco: Never   Tobacco comments:    Patient is at [facility] for alcohol abuse  Vaping Use   Vaping  status: Never Used  Substance and Sexual Activity   Alcohol use: Yes    Alcohol/week: 105.0 standard drinks of alcohol    Types: 105 Cans of beer per week    Comment: 80 oz beer daily, 1/2 pint liquor daily, last use 07/19/22   Drug use: Not Currently    Types: Marijuana, Crack cocaine    Comment: last use mid 1990s   Sexual activity: Not Currently    Birth control/protection: None  Other Topics Concern   Not on file  Social History Narrative   Not on file   Social Drivers of Health   Financial Resource Strain: Low Risk  (01/11/2023)   Overall Financial Resource Strain (CARDIA)    Difficulty of Paying Living Expenses: Not hard at all  Food Insecurity: No Food Insecurity (12/24/2023)   Hunger Vital Sign    Worried About Running Out of Food in the Last Year: Never true    Ran Out of Food in the Last Year: Never true  Transportation Needs: Unmet Transportation Needs (12/24/2023)   PRAPARE - Transportation    Lack of Transportation (Medical): Yes    Lack of Transportation (Non-Medical): Yes  Physical Activity: Insufficiently Active (01/11/2023)   Exercise Vital Sign    Days of Exercise per Week: 2 days    Minutes of Exercise per Session: 20 min  Stress: No Stress Concern Present (01/11/2023)   [employer] of Occupational Health - Occupational Stress Questionnaire    Feeling of Stress : Not at all  Social Connections: Socially Isolated (01/11/2023)   Social Connection and Isolation Panel    Frequency of Communication with Friends and Family: Three times a week    Frequency of Social Gatherings with Friends and Family: Three times a week    Attends Religious Services: Never    Active Member of Clubs or Organizations: No    Attends Banker Meetings: Never    Marital Status: Divorced   Past Medical History:  Past Medical History:  Diagnosis Date   Hypertension    Pneumothorax 12/15/2014   Substance abuse (HCC)    alcohol    Past Surgical History:  Procedure  Laterality Date   APPENDECTOMY     COLONOSCOPY WITH PROPOFOL  N/A 10/28/2022   Procedure: COLONOSCOPY WITH PROPOFOL ;  Surgeon: Therisa Bi, MD;  Location: [facility] ENDOSCOPY;  Service: Gastroenterology;  Laterality: N/A;  REQUESTS ABOUT 9 AM ARRIVAL    Current Medications: Current Facility-Administered Medications  Medication Dose Route Frequency Provider Last Rate Last Admin   acetaminophen  (TYLENOL ) tablet 650 mg  650 mg Oral Q6H PRN Bobbitt, Shalon E, NP   650 mg at 12/26/23 2013   alum & mag hydroxide-simeth (MAALOX/MYLANTA) 200-200-20 MG/5ML suspension 30 mL  30 mL Oral Q4H PRN Bobbitt, Shalon E, NP       haloperidol  (HALDOL ) tablet 5  mg  5 mg Oral TID PRN Bobbitt, Shalon E, NP       And   diphenhydrAMINE  (BENADRYL ) capsule 50 mg  50 mg Oral TID PRN Bobbitt, Shalon E, NP       haloperidol  lactate (HALDOL ) injection 5 mg  5 mg Intramuscular TID PRN Bobbitt, Shalon E, NP       And   diphenhydrAMINE  (BENADRYL ) injection 50 mg  50 mg Intramuscular TID PRN Bobbitt, Shalon E, NP       And   LORazepam  (ATIVAN ) injection 2 mg  2 mg Intramuscular TID PRN Bobbitt, Shalon E, NP       haloperidol  lactate (HALDOL ) injection 10 mg  10 mg Intramuscular TID PRN Bobbitt, Shalon E, NP       And   diphenhydrAMINE  (BENADRYL ) injection 50 mg  50 mg Intramuscular TID PRN Bobbitt, Shalon E, NP       And   LORazepam  (ATIVAN ) injection 2 mg  2 mg Intramuscular TID PRN Bobbitt, Shalon E, NP       escitalopram  (LEXAPRO ) tablet 10 mg  10 mg Oral Daily Cleotilde Hoy HERO, NP   10 mg at 12/28/23 9077   feeding supplement (ENSURE PLUS HIGH PROTEIN) liquid 237 mL  237 mL Oral BID BM Jadapalle, Sree, MD   237 mL at 12/27/23 9157   magnesium  hydroxide (MILK OF MAGNESIA) suspension 30 mL  30 mL Oral Daily PRN Bobbitt, Shalon E, NP       multivitamin with minerals tablet 1 tablet  1 tablet Oral Daily Bobbitt, Shalon E, NP   1 tablet at 12/28/23 9077   nicotine  (NICODERM CQ  - dosed in mg/24 hours) patch 21 mg  21 mg  Transdermal Daily Jadapalle, Sree, MD   21 mg at 12/28/23 9072   nicotine  polacrilex (NICORETTE ) gum 2 mg  2 mg Oral PRN Jadapalle, Sree, MD       thiamine  (VITAMIN B1) tablet 100 mg  100 mg Oral Daily Bobbitt, Shalon E, NP   100 mg at 12/28/23 9077   traZODone  (DESYREL ) tablet 75 mg  75 mg Oral QHS Kenecia Barren E, PA-C   75 mg at 12/27/23 2152    Lab Results:  No results found for this or any previous visit (from the past 48 hours).   Blood Alcohol level:  Lab Results  Component Value Date   ETH 268 (H) 12/23/2023   ETH <10 07/20/2022    Metabolic Disorder Labs: Lab Results  Component Value Date   HGBA1C 4.2 (L) 12/25/2023   MPG 74 12/25/2023   No results found for: PROLACTIN Lab Results  Component Value Date   CHOL 181 12/25/2023   TRIG 41 12/25/2023   HDL 121 12/25/2023   CHOLHDL 1.5 12/25/2023   VLDL 8 12/25/2023   LDLCALC 52 12/25/2023   LDLCALC 116 (H) 01/04/2023    Physical Findings: AIMS:  , ,  ,  ,    CIWA:  CIWA-Ar Total: 0 COWS:      Psychiatric Specialty Exam:  Presentation  General Appearance: Casual  Eye Contact:Fair  Speech:Clear and Coherent  Speech Volume:Normal    Mood and Affect  Mood:Euthymic  Affect:Congruent   Thought Process  Thought Processes:Coherent  Descriptions of Associations:Intact  Orientation:Full (Time, Place and Person)  Thought Content:No data recorded Hallucinations:No data recorded Ideas of Reference:No data recorded Suicidal Thoughts:Suicidal Thoughts: No  Homicidal Thoughts:Homicidal Thoughts: No   Sensorium  Memory:Immediate Fair; Recent Fair  Judgment:Good  Insight:Good   Art therapist  Concentration:Good  Attention Span:No data recorded Recall:No data recorded Fund of Knowledge:No data recorded Language:No data recorded  Psychomotor Activity  Psychomotor Activity:Psychomotor Activity: Normal  Musculoskeletal: Strength & Muscle Tone: within normal limits Gait &  Station: normal Assets  Assets:Communication Skills; Desire for Improvement    Physical Exam: Physical Exam Vitals and nursing note reviewed.  HENT:     Head: Atraumatic.  Eyes:     Extraocular Movements: Extraocular movements intact.  Pulmonary:     Effort: Pulmonary effort is normal.  Neurological:     Mental Status: He is alert and oriented to person, place, and time.    Review of Systems  Psychiatric/Behavioral:  Positive for depression. Negative for hallucinations, substance abuse and suicidal ideas. The patient is nervous/anxious. The patient does not have insomnia.    Blood pressure 120/75, pulse 69, temperature (!) 97.2 F (36.2 C), resp. rate 18, height 5' 10 (1.778 m), weight 74.4 kg, SpO2 96%. Body mass index is 23.53 kg/m.  Diagnosis: Principal Problem:   MDD (major depressive disorder), recurrent episode, moderate (HCC)   PLAN: Safety and Monitoring:  -- Voluntary admission to inpatient psychiatric unit for safety, stabilization and treatment  -- Daily contact with patient to assess and evaluate symptoms and progress in treatment  -- Patient's case to be discussed in multi-disciplinary team meeting  -- Observation Level : q15 minute checks  -- Vital signs:  q12 hours  -- Precautions: suicide, elopement, and assault -- Encouraged patient to participate in unit milieu and in scheduled group therapies  2. Psychiatric Diagnoses and Treatment:   Patent trending up likely discharge Saturday, has court on Monday, plans to stay with a friend temporaril.   Plan: Cotninue Lexapro  10 mg PO daily  Continue Trazodone  to 75 mg PO QHS, Continue Ativan  taper, thiamine , and multivitamin supplementation. Monitor on CIWA protocol for alcohol withdrawal. Support engagement with substance abuse treatment planning and explore rehabilitation placement upon discharge.         -- The risks/benefits/side-effects/alternatives to this medication were discussed in detail with the  patient and time was given for questions. The patient consents to medication trial.                -- Metabolic profile and EKG monitoring obtained while on an atypical antipsychotic (BMI: Lipid Panel: HbgA1c: QTc:)              -- Encouraged patient to participate in unit milieu and in scheduled group therapies                   3. Medical Issues Being Addressed:   Taper completes tomorrow, recent ciwa 0  4. Discharge Planning:   -- Social work and case management to assist with discharge planning and identification of hospital follow-up needs prior to discharge  -- Estimated LOS: 3-4 days  Donnice FORBES Right, PA-C 12/28/2023, 4:17 PM

## 2023-12-28 NOTE — Group Note (Signed)
 Recreation Therapy Group Note   Group Topic:General Recreation  Group Date: 12/28/2023 Start Time: 1000 End Time: 1100 Facilitators: Celestia Jeoffrey BRAVO, LRT, CTRS Location: Courtyard  Group Description: [employer]. LRT and patients played games of basketball, drew with chalk, and played corn hole while outside in the courtyard while getting fresh air and sunlight. Music was being played in the background. LRT and peers conversed about different games they have played before, what they do in their free time and anything else that is on their minds. LRT encouraged pts to drink water after being outside, sweating and getting their heart rate up.  Goal Area(s) Addressed: Patient will build on frustration tolerance skills. Patients will partake in a competitive play game with peers. Patients will gain knowledge of new leisure interest/hobby.    Affect/Mood: Appropriate   Participation Level: Active   Participation Quality: Independent   Behavior: Appropriate   Speech/Thought Process: Coherent   Insight: Good   Judgement: Good   Modes of Intervention: Activity   Patient Response to Interventions:  Receptive   Education Outcome:  Acknowledges education   Clinical Observations/Individualized Feedback: [place] was active in their participation of session activities and group discussion. Pt interacted well with LRT and peers duration of session.    Plan: Continue to engage patient in RT group sessions 2-3x/week.   Jeoffrey BRAVO Celestia, LRT, CTRS 12/28/2023 11:45 AM

## 2023-12-28 NOTE — Group Note (Signed)
 Date:  12/28/2023 Time:  2:49 PM  Group Topic/Focus:  Early Warning Signs:   The focus of this group is to help patients identify signs or symptoms they exhibit before slipping into an unhealthy state or crisis. Identifying Needs:   The focus of this group is to help patients identify their personal needs that have been historically problematic and identify healthy behaviors to address their needs.    Participation Level:  Active  Participation Quality:  Appropriate  Affect:  Appropriate  Cognitive:  Appropriate  Insight: Appropriate  Engagement in Group:  Engaged  Modes of Intervention:  Exploration and Socialization  Additional Comments:    Matthew [name] 12/28/2023, 2:49 PM

## 2023-12-28 NOTE — Group Note (Signed)
 Date:  12/28/2023 Time:  2:44 PM  Group Topic/Focus:  Goals Group:   The focus of this group is to help patients establish daily goals to achieve during treatment and discuss how the patient can incorporate goal setting into their daily lives to aide in recovery.    Participation Level:  Active  Participation Quality:  Appropriate  Affect:  Appropriate  Cognitive:  Appropriate  Insight: Appropriate  Engagement in Group:  Engaged  Modes of Intervention:  Discussion, Education, and Support  Additional Comments:    Deitra Caron Mainland 12/28/2023, 2:44 PM

## 2023-12-28 NOTE — Group Note (Signed)
 Date:  12/28/2023 Time:  8:54 PM  Group Topic/Focus:  Personal Choices and Values:   The focus of this group is to help patients assess and explore the importance of values in their lives, how their values affect their decisions, how they express their values and what opposes their expression. Self Esteem Action Plan:   The focus of this group is to help patients create a plan to continue to build self-esteem after discharge.    Participation Level:  Active  Participation Quality:  Appropriate  Affect:  Appropriate  Cognitive:  Appropriate  Insight: Appropriate  Engagement in Group:  Engaged  Modes of Intervention:  Discussion  Additional Comments:    Dealie Koelzer L 12/28/2023, 8:54 PM

## 2023-12-29 ENCOUNTER — Other Ambulatory Visit: Payer: Self-pay

## 2023-12-29 DIAGNOSIS — F331 Major depressive disorder, recurrent, moderate: Secondary | ICD-10-CM | POA: Diagnosis not present

## 2023-12-29 MED ORDER — NICOTINE 21 MG/24HR TD PT24
21.0000 mg | MEDICATED_PATCH | Freq: Every day | TRANSDERMAL | 0 refills | Status: DC
  Filled 2023-12-29: qty 28, 28d supply, fill #0

## 2023-12-29 MED ORDER — ESCITALOPRAM OXALATE 10 MG PO TABS
10.0000 mg | ORAL_TABLET | Freq: Every day | ORAL | 0 refills | Status: DC
  Filled 2023-12-29: qty 30, 30d supply, fill #0

## 2023-12-29 MED ORDER — TRAZODONE HCL 150 MG PO TABS
75.0000 mg | ORAL_TABLET | Freq: Every day | ORAL | 0 refills | Status: DC
  Filled 2023-12-29: qty 15, 30d supply, fill #0

## 2023-12-29 NOTE — BHH Suicide Risk Assessment (Signed)
 [facility] Discharge Suicide Risk Assessment   Principal Problem: MDD (major depressive disorder), recurrent episode, moderate (HCC) Discharge Diagnoses: Principal Problem:   MDD (major depressive disorder), recurrent episode, moderate (HCC)   Total Time spent with patient: 1 hour  Musculoskeletal: Strength & Muscle Tone: within normal limits Gait & Station: normal Patient leans: N/A  Psychiatric Specialty Exam  Presentation  General Appearance:  Casual  Eye Contact: Fair  Speech: Clear and Coherent  Speech Volume: Normal  Handedness:No data recorded  Mood and Affect  Mood: Euthymic  Duration of Depression Symptoms: No data recorded Affect: Congruent   Thought Process  Thought Processes: Coherent  Descriptions of Associations:Intact  Orientation:Full (Time, Place and Person)  Thought Content:Logical  History of Schizophrenia/Schizoaffective disorder:No  Duration of Psychotic Symptoms:No data recorded Hallucinations:Hallucinations: None  Ideas of Reference:None  Suicidal Thoughts:Suicidal Thoughts: No  Homicidal Thoughts:Homicidal Thoughts: No   Sensorium  Memory: Immediate Fair; Recent Fair  Judgment: Good  Insight: Good   Executive Functions  Concentration: Good  Attention Span:No data recorded Recall:No data recorded Fund of Knowledge:No data recorded Language:No data recorded  Psychomotor Activity  Psychomotor Activity: Psychomotor Activity: Normal   Assets  Assets: Manufacturing systems engineer; Desire for Improvement   Sleep  Sleep: Sleep: Good  Estimated Sleeping Duration (Last 24 Hours): 7.75-10.25 hours  Physical Exam: Physical Exam Vitals and nursing note reviewed.  HENT:     Head: Atraumatic.  Eyes:     Extraocular Movements: Extraocular movements intact.  Pulmonary:     Effort: Pulmonary effort is normal.  Neurological:     Mental Status: He is alert and oriented to person, place, and time.  Psychiatric:         Mood and Affect: Mood normal.        Behavior: Behavior normal.        Thought Content: Thought content normal.        Judgment: Judgment normal.    Review of Systems  Psychiatric/Behavioral:  Negative for depression, hallucinations, memory loss, substance abuse and suicidal ideas. The patient is not nervous/anxious and does not have insomnia.    Blood pressure (!) 144/78, pulse (!) 59, temperature 97.9 F (36.6 C), resp. rate 18, height 5' 10 (1.778 m), weight 74.4 kg, SpO2 100%. Body mass index is 23.53 kg/m.  Mental Status Per Nursing Assessment::   On Admission:  NA (Denies at present)  Demographic Factors:  Male and Low socioeconomic status  Loss Factors: Legal issues  Historical Factors: Impulsivity  Risk Reduction Factors:   Living with another person, especially a relative, Positive social support, Positive therapeutic relationship, and Positive coping skills or problem solving skills  Continued Clinical Symptoms:  Previous Psychiatric Diagnoses and Treatments  Cognitive Features That Contribute To Risk:  None    Suicide Risk:  Minimal: No identifiable suicidal ideation.  Patients presenting with no risk factors but with morbid ruminations; may be classified as minimal risk based on the severity of the depressive symptoms   Follow-up Information     [facility] Follow up.   Why: Walk in hours are from 8AM to [facility] Monday, Wednesday and Friday. Contact information: [address] [place] [address] [phone]                 Plan Of Care/Follow-up recommendations:  # It is recommended to the patient to continue psychiatric medications as prescribed, after discharge from the hospital.   # It is recommended to the patient to follow up with your outpatient  psychiatric provider and PCP. # It was discussed with the patient, the impact of alcohol, drugs, tobacco have been there overall psychiatric and medical wellbeing, and total  abstinence from substance use was recommended. # Prescriptions provided or sent directly to preferred pharmacy at discharge. Patient agreeable to plan. Given the opportunity to ask questions. Appears to feel comfortable with discharge.  # In the event of worsening symptoms, the patient is instructed to call the crisis hotline (988), 911 and or go to the nearest ED for appropriate evaluation and treatment of symptoms. To follow-up with primary care provider for other medical issues, concerns and or health care needs # Patient was discharged as requested with a plan to follow up as noted above.    Donnice FORBES Right, PA-C 12/29/2023, 4:47 PM

## 2023-12-29 NOTE — Group Note (Signed)
 Date:  12/29/2023 Time:  11:22 PM  Group Topic/Focus:  Goals Group:   The focus of this group is to help patients establish daily goals to achieve during treatment and discuss how the patient can incorporate goal setting into their daily lives to aide in recovery. Personal Choices and Values:   The focus of this group is to help patients assess and explore the importance of values in their lives, how their values affect their decisions, how they express their values and what opposes their expression. Self Esteem Action Plan:   The focus of this group is to help patients create a plan to continue to build self-esteem after discharge.    Participation Level:  Active  Participation Quality:  Appropriate and Attentive  Affect:  Appropriate  Cognitive:  Alert, Appropriate, and Oriented  Insight: Appropriate and Good  Engagement in Group:  Engaged  Modes of Intervention:  Discussion and Support  Additional Comments:  N/A  Butler LITTIE Gelineau 12/29/2023, 11:22 PM

## 2023-12-29 NOTE — Group Note (Signed)
 Recreation Therapy Group Note   Group Topic:Health and Wellness  Group Date: 12/29/2023 Start Time: 1020 End Time: 1120 Facilitators: Celestia Jeoffrey BRAVO, LRT, CTRS Location: Courtyard  Group Description: [employer]. LRT and patients played games of basketball, drew with chalk, and played corn hole while outside in the courtyard while getting fresh air and sunlight. Music was being played in the background. LRT and peers conversed about different games they have played before, what they do in their free time and anything else that is on their minds. LRT encouraged pts to drink water after being outside, sweating and getting their heart rate up.  Goal Area(s) Addressed: Patient will build on frustration tolerance skills. Patients will partake in a competitive play game with peers. Patients will gain knowledge of new leisure interest/hobby.    Affect/Mood: Appropriate   Participation Level: Active   Participation Quality: Independent   Behavior: Appropriate   Speech/Thought Process: Coherent   Insight: Good   Judgement: Good   Modes of Intervention: Activity   Patient Response to Interventions:  Receptive   Education Outcome:  Acknowledges education   Clinical Observations/Individualized Feedback: Matthew [name] was active in their participation of session activities and group discussion. Pt interacted well with LRT and peers duration of session.    Plan: Continue to engage patient in RT group sessions 2-3x/week.   Jeoffrey BRAVO Celestia, LRT, CTRS 12/29/2023 11:29 AM

## 2023-12-29 NOTE — Progress Notes (Signed)
 [facility] MD Progress Note  12/29/2023 4:45 PM Matthew [name]  MRN:  [id number]  Patient is a 63 year old male with a past psychiatric history of major depressive disorder and severe alcohol use disorder, presenting voluntarily for inpatient psychiatric treatment. He is currently on an Ativan  taper and receiving thiamine  and multivitamin supplementation.    Subjective:  Chart reviewed, case discussed in multidisciplinary meeting, patient seen during rounds.   8/15: On follow-up patient is alert and oriented.  They are pleasant and cooperative on exam.  They deny adverse effects of medications.  They deny SI, HI, and AVH.  They voiced no concerns or complaints at this time.  They demonstrate good insight into the need for outpatient follow-up and medication compliance.  They are linear logical and future oriented they plan to discharge to friend's house.  They plan to attend court on Monday.  They rate depression at 0 out of 10.  They rated anxiety at 0 out of 10.  They voiced no concerns or complaints at this time.     8/14: Patient seen for follow-up.  They are alert and oriented.  They are pleasant and cooperative.  They are linear logical and future oriented.  They deny SI, HI, and AVH.  They deny withdrawal symptoms.  Taper is complete.  They have been medication compliant.  They have not required any behavioral PRNs.  They note stable mood appetite and sleep.  They rated depression at 0 out of 10.  They are performing ADLs.  They believe that they can go stay with a friend at discharge.  They have court Monday morning.  Discussed we will plan for discharge on Saturday.  8/13: Patient seen for follow up they are alert and oriented. They are pleasant and cooperative. The are working with their daughter to see if they can get assistance moving. Report mood is significantly improved. State time to move forward. Discusses that he has been down before and knows he can pick himself up. Rates depression 2/10  Denies SI/HI/AVH. Notes stable appetite and sleep. Is bathing and having regular bowel movements. Has been going to group and participating in the milieu. Is tolerating medications well denies any adverse effects. Voices no concerns or complaints. Denies withdrawal, taper completes tomorrow.  8/12 On exam today he is alert and oriented. He is pleasant and cooperative. Denies current withdrawal. States that he plans to go back to work at [employer] home at discharge. Plans to stay with a friend or go to shelter at discharge. Denies SI today notes is first day without them. Affect is bright. Rates depression 4-5/10. Denies anxiety. Notes stable appetite and sleep. Decrease trazodone  to 75 mg at bedtime due to morning drowsiness. Most recent ciwa 2.   8/11 On exam today he is alert and oriented. He is pleasant and cooperative. Denies current withdrawal. Is future oriented with talks about going to [place]  to stay with family and start over. Notes passive si without plan or intent. Rates depression at 5/10. Denies anxiety. Notes stable appetite and sleep.   Sleep: Fair  Appetite:  Fair  Past Psychiatric History: see h&P Family History:  Family History  Problem Relation Age of Onset   Hypertension Mother    Other Father        unknown medical history   Diabetes Maternal Grandmother    Other Maternal Grandfather        unknown medical history   Other Paternal Grandmother  unknown medical history   Other Paternal Grandfather        unknown medical history   Social History:  Social History   Substance and Sexual Activity  Alcohol Use Yes   Alcohol/week: 105.0 standard drinks of alcohol   Types: 105 Cans of beer per week   Comment: 80 oz beer daily, 1/2 pint liquor daily, last use 07/19/22     Social History   Substance and Sexual Activity  Drug Use Not Currently   Types: Marijuana, Crack cocaine   Comment: last use mid 74s    Social History   Socioeconomic History    Marital status: Divorced    Spouse name: Not on file   Number of children: Not on file   Years of education: Not on file   Highest education level: Not on file  Occupational History   Occupation: Public affairs consultant at [employer]    Comment: pt afraid he's lost his employment  Tobacco Use   Smoking status: Every Day    Current packs/day: 0.50    Average packs/day: 0.5 packs/day for 35.0 years (17.5 ttl pk-yrs)    Types: Cigarettes   Smokeless tobacco: Never   Tobacco comments:    Patient is at [facility] for alcohol abuse  Vaping Use   Vaping status: Never Used  Substance and Sexual Activity   Alcohol use: Yes    Alcohol/week: 105.0 standard drinks of alcohol    Types: 105 Cans of beer per week    Comment: 80 oz beer daily, 1/2 pint liquor daily, last use 07/19/22   Drug use: Not Currently    Types: Marijuana, Crack cocaine    Comment: last use mid 1990s   Sexual activity: Not Currently    Birth control/protection: None  Other Topics Concern   Not on file  Social History Narrative   Not on file   Social Drivers of Health   Financial Resource Strain: Low Risk  (01/11/2023)   Overall Financial Resource Strain (CARDIA)    Difficulty of Paying Living Expenses: Not hard at all  Food Insecurity: No Food Insecurity (12/24/2023)   Hunger Vital Sign    Worried About Running Out of Food in the Last Year: Never true    Ran Out of Food in the Last Year: Never true  Transportation Needs: Unmet Transportation Needs (12/24/2023)   PRAPARE - Transportation    Lack of Transportation (Medical): Yes    Lack of Transportation (Non-Medical): Yes  Physical Activity: Insufficiently Active (01/11/2023)   Exercise Vital Sign    Days of Exercise per Week: 2 days    Minutes of Exercise per Session: 20 min  Stress: No Stress Concern Present (01/11/2023)   [employer] of Occupational Health - Occupational Stress Questionnaire    Feeling of Stress : Not at all  Social Connections:  Socially Isolated (01/11/2023)   Social Connection and Isolation Panel    Frequency of Communication with Friends and Family: Three times a week    Frequency of Social Gatherings with Friends and Family: Three times a week    Attends Religious Services: Never    Active Member of Clubs or Organizations: No    Attends Banker Meetings: Never    Marital Status: Divorced   Past Medical History:  Past Medical History:  Diagnosis Date   Hypertension    Pneumothorax 12/15/2014   Substance abuse (HCC)    alcohol    Past Surgical History:  Procedure Laterality Date   APPENDECTOMY  COLONOSCOPY WITH PROPOFOL  N/A 10/28/2022   Procedure: COLONOSCOPY WITH PROPOFOL ;  Surgeon: Therisa Bi, MD;  Location: [facility] ENDOSCOPY;  Service: Gastroenterology;  Laterality: N/A;  REQUESTS ABOUT 9 AM ARRIVAL    Current Medications: Current Facility-Administered Medications  Medication Dose Route Frequency Provider Last Rate Last Admin   acetaminophen  (TYLENOL ) tablet 650 mg  650 mg Oral Q6H PRN Bobbitt, Shalon E, NP   650 mg at 12/26/23 2013   alum & mag hydroxide-simeth (MAALOX/MYLANTA) 200-200-20 MG/5ML suspension 30 mL  30 mL Oral Q4H PRN Bobbitt, Shalon E, NP       haloperidol  (HALDOL ) tablet 5 mg  5 mg Oral TID PRN Bobbitt, Shalon E, NP       And   diphenhydrAMINE  (BENADRYL ) capsule 50 mg  50 mg Oral TID PRN Bobbitt, Shalon E, NP       haloperidol  lactate (HALDOL ) injection 5 mg  5 mg Intramuscular TID PRN Bobbitt, Shalon E, NP       And   diphenhydrAMINE  (BENADRYL ) injection 50 mg  50 mg Intramuscular TID PRN Bobbitt, Shalon E, NP       And   LORazepam  (ATIVAN ) injection 2 mg  2 mg Intramuscular TID PRN Bobbitt, Shalon E, NP       haloperidol  lactate (HALDOL ) injection 10 mg  10 mg Intramuscular TID PRN Bobbitt, Shalon E, NP       And   diphenhydrAMINE  (BENADRYL ) injection 50 mg  50 mg Intramuscular TID PRN Bobbitt, Shalon E, NP       And   LORazepam  (ATIVAN ) injection 2 mg  2 mg  Intramuscular TID PRN Bobbitt, Shalon E, NP       escitalopram  (LEXAPRO ) tablet 10 mg  10 mg Oral Daily Cleotilde Hoy HERO, NP   10 mg at 12/29/23 0802   feeding supplement (ENSURE PLUS HIGH PROTEIN) liquid 237 mL  237 mL Oral BID BM Jadapalle, Sree, MD   237 mL at 12/27/23 9157   magnesium  hydroxide (MILK OF MAGNESIA) suspension 30 mL  30 mL Oral Daily PRN Bobbitt, Shalon E, NP       multivitamin with minerals tablet 1 tablet  1 tablet Oral Daily Bobbitt, Shalon E, NP   1 tablet at 12/29/23 0802   nicotine  (NICODERM CQ  - dosed in mg/24 hours) patch 21 mg  21 mg Transdermal Daily Jadapalle, Sree, MD   21 mg at 12/28/23 9072   nicotine  polacrilex (NICORETTE ) gum 2 mg  2 mg Oral PRN Jadapalle, Sree, MD       thiamine  (VITAMIN B1) tablet 100 mg  100 mg Oral Daily Bobbitt, Shalon E, NP   100 mg at 12/29/23 0802   traZODone  (DESYREL ) tablet 75 mg  75 mg Oral QHS Delpha Perko E, PA-C   75 mg at 12/28/23 2059    Lab Results:  No results found for this or any previous visit (from the past 48 hours).   Blood Alcohol level:  Lab Results  Component Value Date   ETH 268 (H) 12/23/2023   ETH <10 07/20/2022    Metabolic Disorder Labs: Lab Results  Component Value Date   HGBA1C 4.2 (L) 12/25/2023   MPG 74 12/25/2023   No results found for: PROLACTIN Lab Results  Component Value Date   CHOL 181 12/25/2023   TRIG 41 12/25/2023   HDL 121 12/25/2023   CHOLHDL 1.5 12/25/2023   VLDL 8 12/25/2023   LDLCALC 52 12/25/2023   LDLCALC 116 (H) 01/04/2023    Physical Findings: AIMS:  , ,  ,  ,  CIWA:  CIWA-Ar Total: 0 COWS:      Psychiatric Specialty Exam:  Presentation  General Appearance: Casual  Eye Contact:Fair  Speech:Clear and Coherent  Speech Volume:Normal    Mood and Affect  Mood:Euthymic  Affect:Congruent   Thought Process  Thought Processes:Coherent  Descriptions of Associations:Intact  Orientation:Full (Time, Place and Person)  Thought  Content:Logical  Hallucinations:Hallucinations: None  Ideas of Reference:None  Suicidal Thoughts:Suicidal Thoughts: No  Homicidal Thoughts:Homicidal Thoughts: No   Sensorium  Memory:Immediate Fair; Recent Fair  Judgment:Good  Insight:Good   Executive Functions  Concentration:Good  Attention Span:No data recorded Recall:No data recorded Fund of Knowledge:No data recorded Language:No data recorded  Psychomotor Activity  Psychomotor Activity:Psychomotor Activity: Normal  Musculoskeletal: Strength & Muscle Tone: within normal limits Gait & Station: normal Assets  Assets:Communication Skills; Desire for Improvement    Physical Exam: Physical Exam Vitals and nursing note reviewed.  HENT:     Head: Atraumatic.  Eyes:     Extraocular Movements: Extraocular movements intact.  Pulmonary:     Effort: Pulmonary effort is normal.  Neurological:     Mental Status: He is alert and oriented to person, place, and time.    Review of Systems  Psychiatric/Behavioral:  Negative for depression, hallucinations, memory loss, substance abuse and suicidal ideas. The patient is not nervous/anxious and does not have insomnia.    Blood pressure (!) 144/78, pulse (!) 59, temperature 97.9 F (36.6 C), resp. rate 18, height 5' 10 (1.778 m), weight 74.4 kg, SpO2 100%. Body mass index is 23.53 kg/m.  Diagnosis: Principal Problem:   MDD (major depressive disorder), recurrent episode, moderate (HCC)   PLAN: Safety and Monitoring:  -- Voluntary admission to inpatient psychiatric unit for safety, stabilization and treatment  -- Daily contact with patient to assess and evaluate symptoms and progress in treatment  -- Patient's case to be discussed in multi-disciplinary team meeting  -- Observation Level : q15 minute checks  -- Vital signs:  q12 hours  -- Precautions: suicide, elopement, and assault -- Encouraged patient to participate in unit milieu and in scheduled group therapies   2. Psychiatric Diagnoses and Treatment:  Patient is doing well appropriate for discharge tomorrow.  Plan: Cotninue Lexapro  10 mg PO daily  Continue Trazodone  to 75 mg PO QHS, Continue Ativan  taper, thiamine , and multivitamin supplementation. Monitor on CIWA protocol for alcohol withdrawal. Support engagement with substance abuse treatment planning and explore rehabilitation placement upon discharge.         -- The risks/benefits/side-effects/alternatives to this medication were discussed in detail with the patient and time was given for questions. The patient consents to medication trial.                -- Metabolic profile and EKG monitoring obtained while on an atypical antipsychotic (BMI: Lipid Panel: HbgA1c: QTc:)              -- Encouraged patient to participate in unit milieu and in scheduled group therapies                   3. Medical Issues Being Addressed:   Alcohol withdrawal taper complete.  No ongoing symptoms of withdrawal.  4. Discharge Planning:   -- Social work and case management to assist with discharge planning and identification of hospital follow-up needs prior to discharge  -- Estimated LOS: 3-4 days  Donnice FORBES Right, PA-C 12/29/2023, 4:45 PM

## 2023-12-29 NOTE — Progress Notes (Signed)
 Pt remains on CIWA; score this shift is 3; denies any acute withdrawal syndromes and reports feeling good. Visible, engaging and participates in unit's activity.   12/28/23 2200  Psych Admission Type (Psych Patients Only)  Admission Status Voluntary  Psychosocial Assessment  Patient Complaints None  Eye Contact Fair  Facial Expression Animated  Affect Appropriate to circumstance  Speech Logical/coherent  Interaction Assertive  Motor Activity Other (Comment) (WDL)  Appearance/Hygiene Unremarkable  Behavior Characteristics Cooperative;Appropriate to situation  Mood Pleasant  Thought Process  Coherency WDL  Content WDL  Delusions None reported or observed  Perception WDL  Hallucination None reported or observed  Judgment WDL  Confusion None  Danger to Self  Current suicidal ideation? Denies  Agreement Not to Harm Self Yes  Description of Agreement Verbal  Danger to Others  Danger to Others None reported or observed   Problem: Education: Goal: Knowledge of [place] [facility] Education information/materials will improve Outcome: Progressing Goal: Emotional status will improve Outcome: Progressing Goal: Mental status will improve Outcome: Progressing Goal: Verbalization of understanding the information provided will improve Outcome: Progressing   Problem: Activity: Goal: Interest or engagement in activities will improve Outcome: Progressing Goal: Sleeping patterns will improve Outcome: Progressing   Problem: Coping: Goal: Ability to verbalize frustrations and anger appropriately will improve Outcome: Progressing Goal: Ability to demonstrate self-control will improve Outcome: Progressing

## 2023-12-29 NOTE — Progress Notes (Signed)
   12/29/23 0805  Psych Admission Type (Psych Patients Only)  Admission Status Voluntary  Psychosocial Assessment  Patient Complaints None  Eye Contact Fair  Facial Expression Animated  Affect Appropriate to circumstance  Speech Logical/coherent  Interaction Assertive  Motor Activity Other (Comment) (normal)  Appearance/Hygiene Unremarkable  Behavior Characteristics Appropriate to situation;Cooperative  Mood Pleasant  Aggressive Behavior  Effect No apparent injury  Thought Process  Coherency WDL  Content WDL  Delusions None reported or observed  Perception WDL  Hallucination None reported or observed  Judgment WDL  Confusion None  Danger to Self  Current suicidal ideation? Denies  Description of Suicide Plan no plan  Self-Injurious Behavior No self-injurious ideation or behavior indicators observed or expressed   Agreement Not to Harm Self Yes  Description of Agreement verbal  Danger to Others  Danger to Others None reported or observed

## 2023-12-29 NOTE — Group Note (Signed)
 Recreation Therapy Group Note   Group Topic:Leisure Education  Group Date: 12/29/2023 Start Time: 1530 End Time: 1635 Facilitators: Celestia Jeoffrey FORBES ARTICE, CTRS Location: Craft Room  Group Description: Leisure. Patients were given the option to choose from singing karaoke, coloring mandalas, using oil pastels, journaling, painting or playing with play-doh. LRT and pts discussed the meaning of leisure, the importance of participating in leisure during their free time/when they're outside of the hospital, as well as how our leisure interests can also serve as coping skills.   Goal Area(s) Addressed:  Patient will identify a current leisure interest.  Patient will learn the definition of "leisure". Patient will practice making a positive decision. Patient will have the opportunity to try a new leisure activity. Patient will communicate with peers and LRT.    Affect/Mood: Appropriate   Participation Level: Active and Engaged   Participation Quality: Independent   Behavior: Appropriate, Calm, and Cooperative   Speech/Thought Process: Coherent   Insight: Good   Judgement: Good   Modes of Intervention: Education, Exploration, and Music   Patient Response to Interventions:  Attentive, Engaged, Interested , and Receptive   Education Outcome:  Acknowledges education   Clinical Observations/Individualized Feedback: Matthew [name] was active in their participation of session activities and group discussion. Pt identified cook and watch tv as things he does in his free time. Pt chose to listen to music while in group.    Plan: Continue to engage patient in RT group sessions 2-3x/week.   Jeoffrey FORBES Celestia, LRT, CTRS 12/29/2023 5:13 PM

## 2023-12-29 NOTE — Plan of Care (Signed)

## 2023-12-29 NOTE — Group Note (Signed)
 Date:  12/29/2023 Time:  10:31 AM  Group Topic/Focus:  Goals Group:   The focus of this group is to help patients establish daily goals to achieve during treatment and discuss how the patient can incorporate goal setting into their daily lives to aide in recovery.    Participation Level:  Active  Participation Quality:  Appropriate  Affect:  Appropriate  Cognitive:  Appropriate  Insight: Appropriate  Engagement in Group:  Engaged  Modes of Intervention:  Discussion, Education, and Support  Additional Comments:    Deitra Caron [place] 12/29/2023, 10:31 AM

## 2023-12-30 DIAGNOSIS — F331 Major depressive disorder, recurrent, moderate: Secondary | ICD-10-CM | POA: Diagnosis not present

## 2023-12-30 NOTE — Discharge Summary (Signed)
 Physician Discharge Summary Note  Patient:  Matthew [name] is an 63 y.o., male MRN:  [id number] DOB:  Mar 14, 1961 Patient phone:  [phone] (home)  Patient address:   [address] [place] [address],   Total time spent: 40 min Date of Admission:  12/24/2023 Date of Discharge: 12/30/23  Reason for Admission:    The patient is a 63 year old male with a history of major depressive disorder and severe alcohol use disorder who presented voluntarily for inpatient psychiatric treatment. He was admitted in the context of a recent relapse to heavy alcohol use, drinking 15-16 beers daily for two months, which led to the loss of housing, job, car, and stability. He endorsed significant depressive symptoms, hopelessness, and thoughts of self-harm without current plan or intent.  Principal Problem: MDD (major depressive disorder), recurrent episode, moderate (HCC) Discharge Diagnoses: Principal Problem:   MDD (major depressive disorder), recurrent episode, moderate (HCC)   Past Psychiatric History: see h and p   Family Psychiatric  History: see h and p Social History:  Social History   Substance and Sexual Activity  Alcohol Use Yes   Alcohol/week: 105.0 standard drinks of alcohol   Types: 105 Cans of beer per week   Comment: 80 oz beer daily, 1/2 pint liquor daily, last use 07/19/22     Social History   Substance and Sexual Activity  Drug Use Not Currently   Types: Marijuana, Crack cocaine   Comment: last use mid 9s    Social History   Socioeconomic History   Marital status: Divorced    Spouse name: Not on file   Number of children: Not on file   Years of education: Not on file   Highest education level: Not on file  Occupational History   Occupation: Public affairs consultant at [employer]    Comment: pt afraid he's lost his employment  Tobacco Use   Smoking status: Every Day    Current packs/day: 0.50    Average packs/day: 0.5 packs/day for 35.0 years (17.5  ttl pk-yrs)    Types: Cigarettes   Smokeless tobacco: Never   Tobacco comments:    Patient is at [facility] for alcohol abuse  Vaping Use   Vaping status: Never Used  Substance and Sexual Activity   Alcohol use: Yes    Alcohol/week: 105.0 standard drinks of alcohol    Types: 105 Cans of beer per week    Comment: 80 oz beer daily, 1/2 pint liquor daily, last use 07/19/22   Drug use: Not Currently    Types: Marijuana, Crack cocaine    Comment: last use mid 1990s   Sexual activity: Not Currently    Birth control/protection: None  Other Topics Concern   Not on file  Social History Narrative   Not on file   Social Drivers of Health   Financial Resource Strain: Low Risk  (01/11/2023)   Overall Financial Resource Strain (CARDIA)    Difficulty of Paying Living Expenses: Not hard at all  Food Insecurity: No Food Insecurity (12/24/2023)   Hunger Vital Sign    Worried About Running Out of Food in the Last Year: Never true    Ran Out of Food in the Last Year: Never true  Transportation Needs: Unmet Transportation Needs (12/24/2023)   PRAPARE - Transportation    Lack of Transportation (Medical): Yes    Lack of Transportation (Non-Medical): Yes  Physical Activity: Insufficiently Active (01/11/2023)   Exercise Vital Sign    Days of Exercise per Week: 2 days  Minutes of Exercise per Session: 20 min  Stress: No Stress Concern Present (01/11/2023)   [employer] of Occupational Health - Occupational Stress Questionnaire    Feeling of Stress : Not at all  Social Connections: Socially Isolated (01/11/2023)   Social Connection and Isolation Panel    Frequency of Communication with Friends and Family: Three times a week    Frequency of Social Gatherings with Friends and Family: Three times a week    Attends Religious Services: Never    Active Member of Clubs or Organizations: No    Attends Banker Meetings: Never    Marital Status: Divorced   Past Medical History:  Past Medical  History:  Diagnosis Date   Hypertension    Pneumothorax 12/15/2014   Substance abuse (HCC)    alcohol    Past Surgical History:  Procedure Laterality Date   APPENDECTOMY     COLONOSCOPY WITH PROPOFOL  N/A 10/28/2022   Procedure: COLONOSCOPY WITH PROPOFOL ;  Surgeon: Therisa Bi, MD;  Location: [facility] ENDOSCOPY;  Service: Gastroenterology;  Laterality: N/A;  REQUESTS ABOUT 9 AM ARRIVAL   Family History:  Family History  Problem Relation Age of Onset   Hypertension Mother    Other Father        unknown medical history   Diabetes Maternal Grandmother    Other Maternal Grandfather        unknown medical history   Other Paternal Grandmother        unknown medical history   Other Paternal Grandfather        unknown medical history    Hospital Course:   The patient was admitted voluntarily to the inpatient psychiatric unit under suicide, elopement, and assault precautions with q15 minute checks and q12 hour vital signs. He was monitored on the CIWA protocol for alcohol withdrawal while completing an Ativan  taper, and he received thiamine  and multivitamin supplementation. He was restarted on Lexapro  10 mg daily and Trazodone100 mg, which was later reduced to 75 mg nightly due to morning drowsiness. He tolerated medications well, denied adverse effects, and demonstrated compliance throughout admission.  Serial evaluations demonstrated progressive mood stabilization and resolution of withdrawal symptoms. On 8/11, he continued to endorse passive suicidal ideation without plan or intent and rated depression at 5/10. By 8/12, he denied suicidal ideation for the first time, reported improved mood, and presented with a brighter affect, rating depression at 4-5/10. On 8/13, he reported significant improvement, describing motivation to "move forward," rating depression at 2/10, and denying SI, HI, or AVH. On 8/14, he was stable, medication-compliant, taper completed, and he denied withdrawal symptoms. By  8/15, he denied depression and anxiety, rated both at 0/10, denied SI/HI/AVH, and demonstrated future-oriented thinking with plans for housing, court follow-up, and outpatient care.  During hospitalization, he received daily multimodal treatment including psychopharmacology, individual therapy, group therapy, psychoeducational and recreational therapy, milieu therapy, and case management, coordinated through weekly multidisciplinary team meetings. Discharge planning was initiated at admission and refined throughout hospitalization to ensure safety and continuity of care.  Risk assessment at discharge indicated acute suicide risk is low and acute violence risk is low. All modifiable risks have been addressed. He demonstrated insight into his illness, commitment to sobriety, and understanding of the importance of outpatient follow-up. He verbalized understanding of his discharge plan, medications, and available mental health and crisis resources. He was counseled to call 911 or present to the nearest emergency room if suicidal or homicidal thoughts return.  At the time of discharge,  the patient was psychiatrically stable, linear, logical, and future-oriented, with no evidence of acute psychosis, suicidality, or homicidality. He is no longer appropriate for an acute inpatient level of care and is able to continue treatment safely in the community with recommended supports.    Physical Findings: AIMS:  , ,  ,  ,    CIWA:  CIWA-Ar Total: 0 COWS:        Psychiatric Specialty Exam:  Presentation  General Appearance:  Casual  Eye Contact: Fair  Speech: Clear and Coherent  Speech Volume: Normal    Mood and Affect  Mood: Euthymic  Affect: Congruent   Thought Process  Thought Processes: Coherent  Descriptions of Associations:Intact  Orientation:Full (Time, Place and Person)  Thought Content:Logical  Hallucinations:Hallucinations: None  Ideas of Reference:None  Suicidal  Thoughts:Suicidal Thoughts: No  Homicidal Thoughts:Homicidal Thoughts: No   Sensorium  Memory: Immediate Fair; Recent Fair  Judgment: Good  Insight: Good   Executive Functions  Concentration: Good  Attention Span:No data recorded Recall:No data recorded Fund of Knowledge:No data recorded Language:No data recorded  Psychomotor Activity  Psychomotor Activity: Psychomotor Activity: Normal  Musculoskeletal: Strength & Muscle Tone: within normal limits Gait & Station: normal Assets  Assets: Manufacturing systems engineer; Desire for Improvement   Sleep  Sleep: Sleep: Good    Physical Exam: Physical Exam Vitals and nursing note reviewed.  HENT:     Head: Atraumatic.  Eyes:     Extraocular Movements: Extraocular movements intact.  Pulmonary:     Effort: Pulmonary effort is normal.  Neurological:     Mental Status: He is alert and oriented to person, place, and time.    Review of Systems  Psychiatric/Behavioral:  Negative for depression, hallucinations, substance abuse and suicidal ideas. The patient is not nervous/anxious and does not have insomnia.    Blood pressure (!) 148/86, pulse 73, temperature 97.7 F (36.5 C), resp. rate 18, height 5' 10 (1.778 m), weight 74.4 kg, SpO2 99%. Body mass index is 23.53 kg/m.   Social History   Tobacco Use  Smoking Status Every Day   Current packs/day: 0.50   Average packs/day: 0.5 packs/day for 35.0 years (17.5 ttl pk-yrs)   Types: Cigarettes  Smokeless Tobacco Never  Tobacco Comments   Patient is at [facility] for alcohol abuse   Tobacco Cessation:  A prescription for an FDA-approved tobacco cessation medication provided at discharge   Blood Alcohol level:  Lab Results  Component Value Date   ETH 268 (H) 12/23/2023   ETH <10 07/20/2022    Metabolic Disorder Labs:  Lab Results  Component Value Date   HGBA1C 4.2 (L) 12/25/2023   MPG 74 12/25/2023   No results found for: PROLACTIN Lab Results  Component Value  Date   CHOL 181 12/25/2023   TRIG 41 12/25/2023   HDL 121 12/25/2023   CHOLHDL 1.5 12/25/2023   VLDL 8 12/25/2023   LDLCALC 52 12/25/2023   LDLCALC 116 (H) 01/04/2023    See Psychiatric Specialty Exam and Suicide Risk Assessment completed by Attending Physician prior to discharge.  Discharge destination:  Other:  Friends residence  Is patient on multiple antipsychotic therapies at discharge:  No   Has Patient had three or more failed trials of antipsychotic monotherapy by history:  No  Recommended Plan for Multiple Antipsychotic Therapies: NA   Allergies as of 12/30/2023   No Known Allergies      Medication List     STOP taking these medications    amLODipine  10 MG tablet  Commonly known as: NORVASC    rosuvastatin  5 MG tablet Commonly known as: CRESTOR        TAKE these medications      Indication  escitalopram  10 MG tablet Commonly known as: LEXAPRO  Take 1 tablet (10 mg total) by mouth daily.  Indication: Major Depressive Disorder   nicotine  21 mg/24hr patch Commonly known as: NICODERM CQ  - dosed in mg/24 hours Place 1 patch (21 mg total) onto the skin daily.  Indication: Nicotine  Addiction   traZODone  150 MG tablet Commonly known as: DESYREL  Take 0.5 tablets (75 mg total) by mouth at bedtime.  Indication: Trouble Sleeping        Follow-up Information     [facility] Follow up.   Why: Walk in hours are from 8AM to [facility] Monday, Wednesday and Friday. Contact information: [address] [place] [address] [phone]                 Follow-up recommendations:    # It is recommended to the patient to continue psychiatric medications as prescribed, after discharge from the hospital.   # It is recommended to the patient to follow up with your outpatient psychiatric provider and PCP. # It was discussed with the patient, the impact of alcohol, drugs, tobacco have been there overall psychiatric and medical wellbeing, and  total abstinence from substance use was recommended. # Prescriptions provided or sent directly to preferred pharmacy at discharge. Patient agreeable to plan. Given the opportunity to ask questions. Appears to feel comfortable with discharge.  # In the event of worsening symptoms, the patient is instructed to call the crisis hotline (988), 911 and or go to the nearest ED for appropriate evaluation and treatment of symptoms. To follow-up with primary care provider for other medical issues, concerns and or health care needs # Patient was discharged as requested with a plan to follow up as noted above.      Signed: Donnice FORBES Right, PA-C 12/30/2023, 10:12 AM

## 2023-12-30 NOTE — Progress Notes (Signed)
 Patient denies SI/I/AVH at this time. Discharge instructions, AVS, prescriptions, and transition record reviewed with patient. Patient agrees to comply with medication management, follow-up visit and outpatient therapy. Patient belongings returned to patient. Patient questions and concerns addressed and answered.  Patient ambulatory off unit. Patient discharged home with friend.

## 2023-12-30 NOTE — Plan of Care (Signed)

## 2023-12-30 NOTE — Progress Notes (Signed)
  [facility] Adult Case Management Discharge Plan :  Will you be returning to the same living situation after discharge:  No. Patient to discharge to his friend's home.  At discharge, do you have transportation home?: Yes,  Patient's friend to provide transportation.  Do you have the ability to pay for your medications: Yes,  [facility]  Release of information consent forms completed and in the chart;  Patient's signature needed at discharge.  Patient to Follow up at:  Follow-up Information     [facility] Follow up.   Why: Walk in hours are from 8AM to [facility] Monday, Wednesday and Friday. Contact information: [address] [place] [address] [phone]                 Next level of care provider has access to [facility] Link:no  Safety Planning and Suicide Prevention discussed: No. Patient Refusal for Family/Significant Other Suicide Prevention Education: The patient AYOUB AREY has refused to provide written consent for family/significant other to be provided Family/Significant Other Suicide Prevention Education during admission and/or prior to discharge.  Physician notified. SPE completed with pt, as pt refused to consent to family contact. SPI pamphlet provided to pt and pt was encouraged to share information with support network, ask questions, and talk about any concerns relating to SPE. Pt denies access to guns/firearms and verbalized understanding of information provided. Mobile Crisis information also provided to pt.      Has patient been referred to the Quitline?: Patient refused referral for treatment  Patient has been referred for addiction treatment: Patient refused referral for treatment; referral information given to patient at discharge.  Alveta CHRISTELLA Kerns, LCSW 12/30/2023, 8:56 AM

## 2023-12-30 NOTE — Plan of Care (Signed)
   Problem: Education: Goal: Knowledge of [facility] Education information/materials will improve Outcome: Progressing Goal: Mental status will improve Outcome: Progressing Goal: Verbalization of understanding the information provided will improve Outcome: Progressing

## 2023-12-30 NOTE — Progress Notes (Signed)
   12/30/23 1100  Psych Admission Type (Psych Patients Only)  Admission Status Voluntary  Psychosocial Assessment  Patient Complaints Worrying (Patient reports worrying about what is coming next.)  Eye Contact Fair  Facial Expression Animated;Worried  Affect Appropriate to circumstance;Anxious  Speech Logical/coherent  Interaction Assertive  Motor Activity Other (Comment) (appropriate for developmental age)  Appearance/Hygiene Unremarkable  Behavior Characteristics Cooperative  Mood Anxious;Pleasant (Patient writes his goal for today is to work on my self esteem. Patient writes he will pray to help him meet that goal.)  Thought Process  Coherency WDL  Content WDL  Delusions None reported or observed  Perception WDL  Hallucination None reported or observed  Judgment WDL  Confusion None  Danger to Self  Current suicidal ideation? Denies  Self-Injurious Behavior No self-injurious ideation or behavior indicators observed or expressed   Danger to Others  Danger to Others None reported or observed

## 2023-12-30 NOTE — Progress Notes (Signed)
   12/29/23 2200  Psych Admission Type (Psych Patients Only)  Admission Status Voluntary  Psychosocial Assessment  Patient Complaints None  Eye Contact Fair  Facial Expression Animated  Affect Appropriate to circumstance  Speech Logical/coherent  Interaction Assertive  Motor Activity Other (Comment) (Normal)  Appearance/Hygiene Unremarkable  Behavior Characteristics Cooperative;Appropriate to situation  Mood Pleasant  Aggressive Behavior  Effect No apparent injury  Thought Process  Coherency WDL  Content WDL  Delusions None reported or observed  Perception WDL  Hallucination None reported or observed  Judgment WDL  Confusion None  Danger to Self  Current suicidal ideation? Denies  Self-Injurious Behavior No self-injurious ideation or behavior indicators observed or expressed   Agreement Not to Harm Self Yes  Description of Agreement Verbal  Danger to Others  Danger to Others None reported or observed

## 2024-05-02 NOTE — Telephone Encounter (Signed)
 Error patient not seen

## 2024-06-16 ENCOUNTER — Emergency Department: Payer: Self-pay

## 2024-06-16 ENCOUNTER — Inpatient Hospital Stay: Payer: Self-pay

## 2024-06-16 ENCOUNTER — Other Ambulatory Visit: Payer: Self-pay

## 2024-06-16 ENCOUNTER — Inpatient Hospital Stay
Admission: EM | Admit: 2024-06-16 | Discharge: 2024-06-20 | DRG: 923 | Disposition: A | Payer: Self-pay | Attending: Pulmonary Disease | Admitting: Pulmonary Disease

## 2024-06-16 DIAGNOSIS — R200 Anesthesia of skin: Secondary | ICD-10-CM | POA: Diagnosis present

## 2024-06-16 DIAGNOSIS — E162 Hypoglycemia, unspecified: Secondary | ICD-10-CM | POA: Diagnosis present

## 2024-06-16 DIAGNOSIS — Y907 Blood alcohol level of 200-239 mg/100 ml: Secondary | ICD-10-CM | POA: Diagnosis present

## 2024-06-16 DIAGNOSIS — R7989 Other specified abnormal findings of blood chemistry: Secondary | ICD-10-CM

## 2024-06-16 DIAGNOSIS — X31XXXA Exposure to excessive natural cold, initial encounter: Secondary | ICD-10-CM

## 2024-06-16 DIAGNOSIS — Z9151 Personal history of suicidal behavior: Secondary | ICD-10-CM

## 2024-06-16 DIAGNOSIS — F19129 Other psychoactive substance abuse with intoxication, unspecified: Secondary | ICD-10-CM | POA: Diagnosis present

## 2024-06-16 DIAGNOSIS — T68XXXA Hypothermia, initial encounter: Principal | ICD-10-CM | POA: Diagnosis present

## 2024-06-16 DIAGNOSIS — Z5900 Homelessness unspecified: Secondary | ICD-10-CM

## 2024-06-16 DIAGNOSIS — E872 Acidosis, unspecified: Secondary | ICD-10-CM | POA: Diagnosis present

## 2024-06-16 DIAGNOSIS — F1010 Alcohol abuse, uncomplicated: Secondary | ICD-10-CM | POA: Diagnosis present

## 2024-06-16 DIAGNOSIS — F1090 Alcohol use, unspecified, uncomplicated: Secondary | ICD-10-CM

## 2024-06-16 DIAGNOSIS — F19929 Other psychoactive substance use, unspecified with intoxication, unspecified: Secondary | ICD-10-CM

## 2024-06-16 LAB — URINALYSIS, W/ REFLEX TO CULTURE (INFECTION SUSPECTED)
Bacteria, UA: NONE SEEN
Bilirubin Urine: NEGATIVE
Glucose, UA: NEGATIVE mg/dL
Ketones, ur: NEGATIVE mg/dL
Leukocytes,Ua: NEGATIVE
Nitrite: NEGATIVE
Protein, ur: 100 mg/dL — AB
Specific Gravity, Urine: 1.009 (ref 1.005–1.030)
pH: 5 (ref 5.0–8.0)

## 2024-06-16 LAB — COMPREHENSIVE METABOLIC PANEL WITH GFR
ALT: 33 U/L (ref 0–44)
AST: 68 U/L — ABNORMAL HIGH (ref 15–41)
Albumin: 4.2 g/dL (ref 3.5–5.0)
Alkaline Phosphatase: 102 U/L (ref 38–126)
Anion gap: 27 — ABNORMAL HIGH (ref 5–15)
BUN: 7 mg/dL — ABNORMAL LOW (ref 8–23)
CO2: 13 mmol/L — ABNORMAL LOW (ref 22–32)
Calcium: 8.7 mg/dL — ABNORMAL LOW (ref 8.9–10.3)
Chloride: 98 mmol/L (ref 98–111)
Creatinine, Ser: 0.94 mg/dL (ref 0.61–1.24)
GFR, Estimated: >60 mL/min
Glucose, Bld: 135 mg/dL — ABNORMAL HIGH (ref 70–99)
Potassium: 4.1 mmol/L (ref 3.5–5.1)
Sodium: 138 mmol/L (ref 135–145)
Total Bilirubin: 0.4 mg/dL (ref 0.0–1.2)
Total Protein: 7.4 g/dL (ref 6.5–8.1)

## 2024-06-16 LAB — LACTIC ACID, PLASMA
Lactic Acid, Venous: >9 mmol/L (ref 0.5–1.9)
Lactic Acid, Venous: >9 mmol/L (ref 0.5–1.9)
Lactic Acid, Venous: 5.2 mmol/L (ref 0.5–1.9)

## 2024-06-16 LAB — CK: Total CK: 231 U/L (ref 49–397)

## 2024-06-16 LAB — GLUCOSE, CAPILLARY
Glucose-Capillary: 121 mg/dL — ABNORMAL HIGH (ref 70–99)
Glucose-Capillary: 34 mg/dL — CL (ref 70–99)
Glucose-Capillary: 37 mg/dL — CL (ref 70–99)
Glucose-Capillary: 43 mg/dL — CL (ref 70–99)
Glucose-Capillary: 91 mg/dL (ref 70–99)

## 2024-06-16 LAB — CBC
HCT: 43.2 % (ref 39.0–52.0)
Hemoglobin: 14 g/dL (ref 13.0–17.0)
MCH: 33.5 pg (ref 26.0–34.0)
MCHC: 32.4 g/dL (ref 30.0–36.0)
MCV: 103.3 fL — ABNORMAL HIGH (ref 80.0–100.0)
Platelets: 279 10*3/uL (ref 150–400)
RBC: 4.18 MIL/uL — ABNORMAL LOW (ref 4.22–5.81)
RDW: 12.1 % (ref 11.5–15.5)
WBC: 8 10*3/uL (ref 4.0–10.5)
nRBC: 0 % (ref 0.0–0.2)

## 2024-06-16 LAB — MRSA NEXT GEN BY PCR, NASAL: MRSA by PCR Next Gen: NOT DETECTED

## 2024-06-16 LAB — PROTIME-INR
INR: 1 (ref 0.8–1.2)
Prothrombin Time: 14.1 s (ref 11.4–15.2)

## 2024-06-16 LAB — ETHANOL: Alcohol, Ethyl (B): 214 mg/dL — ABNORMAL HIGH

## 2024-06-16 MED ORDER — DEXTROSE 50 % IV SOLN
25.0000 g | INTRAVENOUS | Status: AC
  Administered 2024-06-16: 25 g via INTRAVENOUS
  Filled 2024-06-16: qty 50

## 2024-06-16 MED ORDER — METRONIDAZOLE 500 MG/100ML IV SOLN
500.0000 mg | Freq: Once | INTRAVENOUS | Status: AC
  Administered 2024-06-16: 500 mg via INTRAVENOUS
  Filled 2024-06-16 (×2): qty 100

## 2024-06-16 MED ORDER — SODIUM CHLORIDE 0.9 % IV SOLN
260.0000 mg | Freq: Once | INTRAVENOUS | Status: AC
  Administered 2024-06-16: 260 mg via INTRAVENOUS
  Filled 2024-06-16: qty 2

## 2024-06-16 MED ORDER — PHENOBARBITAL 32.4 MG PO TABS: 97.2000 mg | ORAL_TABLET | Freq: Three times a day (TID) | ORAL | Status: DC

## 2024-06-16 MED ORDER — POLYETHYLENE GLYCOL 3350 17 G PO PACK: 17.0000 g | PACK | Freq: Every day | ORAL | Status: DC | PRN

## 2024-06-16 MED ORDER — HYALURONIDASE HUMAN 150 UNIT/ML IJ SOLN
150.0000 [IU] | Freq: Once | INTRAMUSCULAR | Status: AC
  Administered 2024-06-16: 150 [IU] via SUBCUTANEOUS
  Filled 2024-06-16: qty 1

## 2024-06-16 MED ORDER — ORAL CARE MOUTH RINSE: 15.0000 mL | OROMUCOSAL | Status: DC | PRN

## 2024-06-16 MED ORDER — VANCOMYCIN HCL IN DEXTROSE 1-5 GM/200ML-% IV SOLN
1000.0000 mg | Freq: Once | INTRAVENOUS | Status: AC
  Administered 2024-06-16: 1000 mg via INTRAVENOUS
  Filled 2024-06-16: qty 200

## 2024-06-16 MED ORDER — THIAMINE MONONITRATE 100 MG PO TABS
100.0000 mg | ORAL_TABLET | ORAL | Status: DC
  Administered 2024-06-18 – 2024-06-19 (×2): 100 mg via ORAL
  Filled 2024-06-16 (×3): qty 1

## 2024-06-16 MED ORDER — FOLIC ACID 1 MG PO TABS
1.0000 mg | ORAL_TABLET | Freq: Every day | ORAL | Status: DC
  Administered 2024-06-18 – 2024-06-20 (×3): 1 mg via ORAL
  Filled 2024-06-16 (×3): qty 1

## 2024-06-16 MED ORDER — SODIUM CHLORIDE 0.9 % IV BOLUS
1000.0000 mL | Freq: Once | INTRAVENOUS | Status: AC
  Administered 2024-06-16: 1000 mL via INTRAVENOUS

## 2024-06-16 MED ORDER — CHLORHEXIDINE GLUCONATE CLOTH 2 % EX PADS
6.0000 | MEDICATED_PAD | Freq: Every day | CUTANEOUS | Status: DC
  Administered 2024-06-16 – 2024-06-19 (×3): 6 via TOPICAL
  Filled 2024-06-16: qty 6

## 2024-06-16 MED ORDER — PHENOBARBITAL SODIUM 130 MG/ML IJ SOLN
97.2000 mg | Freq: Three times a day (TID) | INTRAMUSCULAR | Status: AC
  Administered 2024-06-16 – 2024-06-18 (×6): 97.2 mg via INTRAVENOUS
  Filled 2024-06-16 (×6): qty 1

## 2024-06-16 MED ORDER — DEXTROSE 50 % IV SOLN
25.0000 g | INTRAVENOUS | Status: AC
  Administered 2024-06-17: 25 g via INTRAVENOUS
  Filled 2024-06-16: qty 50

## 2024-06-16 MED ORDER — LACTATED RINGERS IV SOLN: INTRAVENOUS | Status: DC

## 2024-06-16 MED ORDER — IOHEXOL 300 MG/ML  SOLN
100.0000 mL | Freq: Once | INTRAMUSCULAR | Status: AC | PRN
  Administered 2024-06-16: 100 mL via INTRAVENOUS

## 2024-06-16 MED ORDER — LACTATED RINGERS IV BOLUS
1000.0000 mL | Freq: Once | INTRAVENOUS | Status: AC
  Administered 2024-06-16: 1000 mL via INTRAVENOUS

## 2024-06-16 MED ORDER — SODIUM CHLORIDE 0.9 % IV SOLN
2.0000 g | Freq: Once | INTRAVENOUS | Status: AC
  Administered 2024-06-16: 2 g via INTRAVENOUS
  Filled 2024-06-16: qty 12.5

## 2024-06-16 MED ORDER — PHENOBARBITAL SODIUM 65 MG/ML IJ SOLN
65.0000 mg | Freq: Three times a day (TID) | INTRAMUSCULAR | Status: DC
  Administered 2024-06-19: 65 mg via INTRAVENOUS
  Filled 2024-06-16: qty 1

## 2024-06-16 MED ORDER — ADULT MULTIVITAMIN W/MINERALS CH
1.0000 | ORAL_TABLET | Freq: Every day | ORAL | Status: DC
  Administered 2024-06-18 – 2024-06-20 (×3): 1 via ORAL
  Filled 2024-06-16 (×3): qty 1

## 2024-06-16 MED ORDER — THIAMINE HCL 100 MG/ML IJ SOLN
200.0000 mg | INTRAVENOUS | Status: DC
  Administered 2024-06-16 – 2024-06-17 (×2): 200 mg via INTRAVENOUS
  Filled 2024-06-16 (×4): qty 2

## 2024-06-16 MED ORDER — SENNA 8.6 MG PO TABS: 1.0000 | ORAL_TABLET | Freq: Two times a day (BID) | ORAL | Status: DC | PRN

## 2024-06-16 NOTE — Sepsis Progress Note (Addendum)
 Elink following code sepsis  1647 Notified provider and bedside nurse of need to order and draw repeat lactic acid.
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
## 2024-06-16 NOTE — Consult Note (Signed)
 PHARMACY CONSULT NOTE - ELECTROLYTES  Pharmacy Consult for Electrolyte Monitoring and Replacement   Recent Labs:   CrCl cannot be calculated (Unknown ideal weight.). Potassium (mmol/L)  Date Value  06/16/2024 4.1  03/28/2014 4.1   Magnesium  (mg/dL)  Date Value  96/84/7976 2.1   Calcium  (mg/dL)  Date Value  97/98/7973 8.7 (L)   Calcium , Total (mg/dL)  Date Value  88/86/7984 8.5   Albumin (g/dL)  Date Value  97/98/7973 4.2  10/13/2022 4.6  03/28/2014 3.8   Sodium (mmol/L)  Date Value  06/16/2024 138  10/13/2022 139  03/28/2014 140    Assessment  Matthew [name] is a 64 y.o. male presenting with cold exposure. PMH significant for depression, axiety, and alcohol use disorder . Pharmacy has been consulted to monitor and replace electrolytes.  Diet: NPO MIVF: LR @ 150 mL/hr Pertinent medications: N/A  Goal of Therapy: Electrolytes WNL  Plan:  No replacement indicated at this time. Check BMP, Mg, Phos with AM labs  Thank you for allowing pharmacy to be a part of this patient's care.  Kayla JULIANNA Blew, PharmD Clinical Pharmacist 06/16/2024 3:27 PM

## 2024-06-16 NOTE — Consult Note (Signed)
 CODE SEPSIS - PHARMACY COMMUNICATION  **Broad Spectrum Antibiotics should be administered within 1 hour of Sepsis diagnosis**  Time Code Sepsis Called/Page Received: 1441  Antibiotics Ordered: cefepime , vancomycin , Flagyl   Time of 1st antibiotic administration: 1539  Additional action taken by pharmacy: N/A  Kayla JULIANNA Blew ,PharmD Clinical Pharmacist  06/16/2024  2:43 PM

## 2024-06-16 NOTE — ED Notes (Signed)
 Bair Hugger applied

## 2024-06-16 NOTE — ED Notes (Signed)
Phlebotomist in room.
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
## 2024-06-17 DIAGNOSIS — E8729 Other acidosis: Secondary | ICD-10-CM

## 2024-06-17 LAB — HIV ANTIBODY (ROUTINE TESTING W REFLEX): HIV Screen 4th Generation wRfx: NONREACTIVE

## 2024-06-17 LAB — BASIC METABOLIC PANEL WITH GFR
Anion gap: 11 (ref 5–15)
BUN: 6 mg/dL — ABNORMAL LOW (ref 8–23)
CO2: 22 mmol/L (ref 22–32)
Calcium: 8.5 mg/dL — ABNORMAL LOW (ref 8.9–10.3)
Chloride: 105 mmol/L (ref 98–111)
Creatinine, Ser: 0.65 mg/dL (ref 0.61–1.24)
GFR, Estimated: >60 mL/min
Glucose, Bld: 96 mg/dL (ref 70–99)
Potassium: 4.1 mmol/L (ref 3.5–5.1)
Sodium: 137 mmol/L (ref 135–145)

## 2024-06-17 LAB — MAGNESIUM: Magnesium: 1.8 mg/dL (ref 1.7–2.4)

## 2024-06-17 LAB — CBC
HCT: 33.6 % — ABNORMAL LOW (ref 39.0–52.0)
Hemoglobin: 11.8 g/dL — ABNORMAL LOW (ref 13.0–17.0)
MCH: 33.7 pg (ref 26.0–34.0)
MCHC: 35.1 g/dL (ref 30.0–36.0)
MCV: 96 fL (ref 80.0–100.0)
Platelets: 203 10*3/uL (ref 150–400)
RBC: 3.5 MIL/uL — ABNORMAL LOW (ref 4.22–5.81)
RDW: 11.9 % (ref 11.5–15.5)
WBC: 7 10*3/uL (ref 4.0–10.5)
nRBC: 0 % (ref 0.0–0.2)

## 2024-06-17 LAB — GLUCOSE, CAPILLARY
Glucose-Capillary: 108 mg/dL — ABNORMAL HIGH (ref 70–99)
Glucose-Capillary: 132 mg/dL — ABNORMAL HIGH (ref 70–99)
Glucose-Capillary: 152 mg/dL — ABNORMAL HIGH (ref 70–99)
Glucose-Capillary: 86 mg/dL (ref 70–99)

## 2024-06-17 LAB — PHOSPHORUS: Phosphorus: 3.4 mg/dL (ref 2.5–4.6)

## 2024-06-17 MED ORDER — ENOXAPARIN SODIUM 40 MG/0.4ML IJ SOSY
40.0000 mg | PREFILLED_SYRINGE | Freq: Every day | INTRAMUSCULAR | Status: DC
  Administered 2024-06-17 – 2024-06-19 (×3): 40 mg via SUBCUTANEOUS
  Filled 2024-06-17 (×3): qty 0.4

## 2024-06-17 MED ORDER — MAGNESIUM SULFATE 2 GM/50ML IV SOLN
2.0000 g | Freq: Once | INTRAVENOUS | Status: AC
  Administered 2024-06-17: 2 g via INTRAVENOUS
  Filled 2024-06-17: qty 50

## 2024-06-17 MED ORDER — DEXTROSE IN LACTATED RINGERS 5 % IV SOLN: INTRAVENOUS | Status: DC

## 2024-06-17 NOTE — Plan of Care (Signed)

## 2024-06-17 NOTE — Consult Note (Signed)
 PHARMACY CONSULT NOTE - ELECTROLYTES  Pharmacy Consult for Electrolyte Monitoring and Replacement   Recent Labs: Potassium (mmol/L)  Date Value  06/17/2024 4.1  03/28/2014 4.1   Magnesium  (mg/dL)  Date Value  97/97/7973 1.8   Calcium  (mg/dL)  Date Value  97/97/7973 8.5 (L)   Calcium , Total (mg/dL)  Date Value  88/86/7984 8.5   Albumin (g/dL)  Date Value  97/98/7973 4.2  10/13/2022 4.6  03/28/2014 3.8   Phosphorus (mg/dL)  Date Value  97/97/7973 3.4   Sodium (mmol/L)  Date Value  06/17/2024 137  10/13/2022 139  03/28/2014 140   Height: 5' 10 (177.8 cm) Weight: 75 kg (165 lb 5.5 oz) IBW/kg (Calculated) : 73 Estimated Creatinine Clearance: 100.1 mL/min (by C-G formula based on SCr of 0.65 mg/dL).  Assessment  Matthew [name] is a 64 y.o. male presenting with hypothermia. PMH significant for EtOH use disorder complicated by withdrawal. Pharmacy has been consulted to monitor and replace electrolytes.  Diet: NPO MIVF: D5LR @ 125 mL/hr Pertinent medications: N/A  Goal of Therapy: Electrolytes within normal limits  Plan:  Mg 1.8, magnesium  sulfate 2 g IV x 1 Follow-up electrolytes with AM labs tomorrow  Thank you for allowing pharmacy to be a part of this patients care.  Marolyn KATHEE Mare 06/17/2024 7:43 AM
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
## 2024-06-17 NOTE — TOC Progression Note (Signed)
 Transition of Care [facility]) - Progression Note    Patient Details  Name: Matthew [name] MRN: [id number] Date of Birth: [birth year]  [facility] of [facility] CM/SW Contact  K'La JINNY Ruts, LCSW Phone Number: 06/17/2024, 10:28 AM  Clinical Narrative:    Chart reviewed. I spoke with the bedside nurse. Patient is to drowsy to speak over the phone. I will consult with the patient at another time.                     Expected Discharge Plan and Services                                               Social Drivers of Health (SDOH) Interventions SDOH Screenings   Food Insecurity: Food Insecurity Present (06/17/2024)  Housing: High Risk (06/16/2024)  Transportation Needs: Unmet Transportation Needs (06/17/2024)  Utilities: At Risk (06/17/2024)  Alcohol Screen: High Risk (12/24/2023)  Depression (PHQ2-9): Medium Risk (10/13/2022)  Financial Resource Strain: Low Risk (01/11/2023)  Physical Activity: Insufficiently Active (01/11/2023)  Social Connections: Socially Isolated (01/11/2023)  Stress: No Stress Concern Present (01/11/2023)  Tobacco Use: High Risk (06/16/2024)  Health Literacy: Adequate Health Literacy (01/11/2023)    Readmission Risk Interventions     No data to display
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
## 2024-06-18 LAB — BASIC METABOLIC PANEL WITH GFR
Anion gap: 8 (ref 5–15)
BUN: 9 mg/dL (ref 8–23)
CO2: 25 mmol/L (ref 22–32)
Calcium: 8.5 mg/dL — ABNORMAL LOW (ref 8.9–10.3)
Chloride: 104 mmol/L (ref 98–111)
Creatinine, Ser: 0.78 mg/dL (ref 0.61–1.24)
GFR, Estimated: >60 mL/min
Glucose, Bld: 68 mg/dL — ABNORMAL LOW (ref 70–99)
Potassium: 4.2 mmol/L (ref 3.5–5.1)
Sodium: 138 mmol/L (ref 135–145)

## 2024-06-18 LAB — CBC
HCT: 32.6 % — ABNORMAL LOW (ref 39.0–52.0)
Hemoglobin: 11.2 g/dL — ABNORMAL LOW (ref 13.0–17.0)
MCH: 34.1 pg — ABNORMAL HIGH (ref 26.0–34.0)
MCHC: 34.4 g/dL (ref 30.0–36.0)
MCV: 99.4 fL (ref 80.0–100.0)
Platelets: 174 10*3/uL (ref 150–400)
RBC: 3.28 MIL/uL — ABNORMAL LOW (ref 4.22–5.81)
RDW: 12.3 % (ref 11.5–15.5)
WBC: 5.4 10*3/uL (ref 4.0–10.5)
nRBC: 0 % (ref 0.0–0.2)

## 2024-06-18 LAB — MAGNESIUM: Magnesium: 2 mg/dL (ref 1.7–2.4)

## 2024-06-18 LAB — PHOSPHORUS: Phosphorus: 3.7 mg/dL (ref 2.5–4.6)

## 2024-06-18 MED ORDER — ENSURE PLUS HIGH PROTEIN PO LIQD
237.0000 mL | Freq: Two times a day (BID) | ORAL | Status: DC
  Administered 2024-06-18 – 2024-06-20 (×5): 237 mL via ORAL
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
## 2024-06-18 NOTE — Consult Note (Signed)
 PHARMACY CONSULT NOTE - ELECTROLYTES  Pharmacy Consult for Electrolyte Monitoring and Replacement   Recent Labs: Potassium (mmol/L)  Date Value  06/18/2024 4.2  03/28/2014 4.1   Magnesium  (mg/dL)  Date Value  97/96/7973 2.0   Calcium  (mg/dL)  Date Value  97/96/7973 8.5 (L)   Calcium , Total (mg/dL)  Date Value  88/86/7984 8.5   Albumin (g/dL)  Date Value  97/98/7973 4.2  10/13/2022 4.6  03/28/2014 3.8   Phosphorus (mg/dL)  Date Value  97/96/7973 3.7   Sodium (mmol/L)  Date Value  06/18/2024 138  10/13/2022 139  03/28/2014 140   Height: 5' 10 (177.8 cm) Weight: 73.2 kg (161 lb 6 oz) IBW/kg (Calculated) : 73 Estimated Creatinine Clearance: 100.1 mL/min (by C-G formula based on SCr of 0.78 mg/dL).  Assessment  Matthew [name] is a 64 y.o. male presenting with hypothermia. PMH significant for EtOH use disorder complicated by withdrawal. Pharmacy has been consulted to monitor and replace electrolytes.  Diet: regular MIVF: N/A Pertinent medications: N/A  Goal of Therapy: Electrolytes within normal limits  2/3: K 4.2, Mg 2, Phos 3.7  Plan:  No electrolyte replacement is warranted at this time Pharmacy will sign off on CCM consult since patient is no longer in the ICU Will continue to monitor electrolytes peripherally    Thank you for allowing pharmacy to be a part of this patients care.   Ransom Blanch PGY-1 Pharmacy Resident  [facility]  06/18/2024 7:31 AM

## 2024-06-18 NOTE — Plan of Care (Signed)

## 2024-06-19 ENCOUNTER — Other Ambulatory Visit: Payer: Self-pay

## 2024-06-19 DIAGNOSIS — F1010 Alcohol abuse, uncomplicated: Secondary | ICD-10-CM | POA: Insufficient documentation

## 2024-06-19 DIAGNOSIS — E872 Acidosis, unspecified: Secondary | ICD-10-CM | POA: Insufficient documentation

## 2024-06-19 DIAGNOSIS — E162 Hypoglycemia, unspecified: Secondary | ICD-10-CM | POA: Insufficient documentation

## 2024-06-19 LAB — GLUCOSE, CAPILLARY: Glucose-Capillary: 116 mg/dL — ABNORMAL HIGH (ref 70–99)

## 2024-06-19 MED ORDER — PHENOBARBITAL 60 MG PO TABS
60.0000 mg | ORAL_TABLET | Freq: Three times a day (TID) | ORAL | 0 refills | Status: DC
  Filled 2024-06-19: qty 5, 2d supply, fill #0

## 2024-06-19 MED ORDER — PHENOBARBITAL 32.4 MG PO TABS
64.8000 mg | ORAL_TABLET | Freq: Three times a day (TID) | ORAL | Status: DC
  Administered 2024-06-19 – 2024-06-20 (×3): 64.8 mg via ORAL
  Filled 2024-06-19 (×3): qty 2
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
## 2024-06-19 NOTE — Progress Notes (Signed)
 Mobility Specialist - Progress Note    06/19/24 1300  Mobility  Activity Ambulated with assistance  Level of Assistance Independent after set-up  Assistive Device None  Distance Ambulated (ft) 425 ft  Range of Motion/Exercises All extremities  Activity Response Tolerated well  Mobility visit 1 Mobility  Mobility Specialist Start Time (ACUTE ONLY) 1340  Mobility Specialist Stop Time (ACUTE ONLY) 1348  Mobility Specialist Time Calculation (min) (ACUTE ONLY) 8 min   Pt was supine in bed with the HOB elevated on RA upon entry. Pt agreed to mobility. Pt is able today to get to the EOB independently with no bed features. Pt is able today to STS independently with no AD. Pt ambulated well. Pt did describe numbness in hand and feet, but continued with activities. After activities pt returned to the room at the EOB for lunch. Pt is in the room with needs in reach.   Clem Rodes Mobility Specialist 06/19/24, 1:54 PM

## 2024-06-19 NOTE — Plan of Care (Signed)

## 2024-06-19 NOTE — TOC Transition Note (Addendum)
 Transition of Care [facility]) - Discharge Note   Patient Details  Name: Matthew [name] MRN: [id number] Date of Birth: 05/04/1961  [facility] CM/SW Contact:  Lauraine JAYSON Carpen, LCSW Phone Number: 06/19/2024, 10:27 AM   Clinical Narrative: Patient has orders to discharge home today. No further concerns. CSW signing off.    12:03 pm: Per MD, discharge cancelled due to blood sugar.  Final next level of care: Home/Self Care Barriers to Discharge: Barriers Resolved   Patient Goals and CMS Choice            Discharge Placement                Patient to be transferred to facility by: Family/friend   Patient and family notified of of transfer: 06/19/24  Discharge Plan and Services Additional resources added to the After Visit Summary for       Post Acute Care Choice: NA                               Social Drivers of Health (SDOH) Interventions SDOH Screenings   Food Insecurity: Food Insecurity Present (06/17/2024)  Housing: High Risk (06/16/2024)  Transportation Needs: Unmet Transportation Needs (06/17/2024)  Utilities: At Risk (06/17/2024)  Alcohol Screen: High Risk (12/24/2023)  Depression (PHQ2-9): Medium Risk (10/13/2022)  Financial Resource Strain: Low Risk (01/11/2023)  Physical Activity: Insufficiently Active (01/11/2023)  Social Connections: Socially Isolated (01/11/2023)  Stress: No Stress Concern Present (01/11/2023)  Tobacco Use: High Risk (06/16/2024)  Health Literacy: Adequate Health Literacy (01/11/2023)     Readmission Risk Interventions     No data to display

## 2024-06-20 LAB — CORTISOL-AM, BLOOD: Cortisol - AM: 11.5 ug/dL (ref 6.7–22.6)

## 2024-06-20 LAB — BASIC METABOLIC PANEL WITH GFR
Anion gap: 7 (ref 5–15)
BUN: 12 mg/dL (ref 8–23)
CO2: 25 mmol/L (ref 22–32)
Calcium: 8.6 mg/dL — ABNORMAL LOW (ref 8.9–10.3)
Chloride: 104 mmol/L (ref 98–111)
Creatinine, Ser: 0.69 mg/dL (ref 0.61–1.24)
GFR, Estimated: >60 mL/min
Glucose, Bld: 88 mg/dL (ref 70–99)
Potassium: 4.1 mmol/L (ref 3.5–5.1)
Sodium: 137 mmol/L (ref 135–145)

## 2024-06-20 LAB — TSH: TSH: 3.53 u[IU]/mL (ref 0.350–4.500)

## 2024-06-20 LAB — MAGNESIUM: Magnesium: 2.1 mg/dL (ref 1.7–2.4)

## 2024-06-20 LAB — PHOSPHORUS: Phosphorus: 3.6 mg/dL (ref 2.5–4.6)

## 2024-06-20 NOTE — Discharge Summary (Signed)
 " Physician Discharge Summary   Patient: Matthew [name] MRN: [id number] DOB: 1960/10/31  Admit date:     06/16/2024  Discharge date: 06/20/24  Discharge Physician: Murvin Mana   PCP: Pcp, No   Recommendations at discharge:   Follow-up with PCP, TOC has made arrangements.  Discharge Diagnoses: Principal Problem:   Hypothermia Active Problems:   Lactic acidosis   Hypoglycemia   Alcohol abuse  Resolved Problems:   * No resolved hospital problems. [facility] Course: Mr. Orrico to PCU [facility], came in with profound hypothermia, core temp 83 [Was found down at a gas station with beer bottles in 25deg F weather] actively rewarmed. Now normothermic. Also alcoholic w/ alcoholic liver cirrhosis high risk for wihtdrawals on phenobarb protocol.  Patient did not manifest any evidence of alcohol withdrawal, he had some hypoglycemia which has resolved today.  Medically stable for discharge  Assessment and Plan: Severe hypothermia. Alcoholic use disorder. Patient developed a severe hypothermia time admission due to prolonged cold exposure.  He was rewarmed.  This is a happened when patient had alcohol intoxication. Condition has improved.   Hypoglycemia. Patient had a significant hypoglycemia at admission, but has resolved for the last 24 hours.  TSH was normal.  Since condition has improved, he can follow-up with PCP for results of cortisol as well as insulin and C-peptide which are send out.   Severe lactic acidosis. Most likely secondary to alcohol drinking and cold exposure. Condition has improved.        Consultants: None Procedures performed: None  Disposition: Home Diet recommendation:  Discharge Diet Orders (From admission, onward)     Start     Ordered   06/20/24 0000  Diet - low sodium heart healthy        06/20/24 0949   06/19/24 0000  Diet - low sodium heart healthy        06/19/24 1022           Cardiac diet DISCHARGE MEDICATION: Allergies as of 06/20/2024   No  Known Allergies      Medication List     STOP taking these medications    escitalopram  10 MG tablet Commonly known as: LEXAPRO    nicotine  21 mg/24hr patch Commonly known as: NICODERM CQ  - dosed in mg/24 hours   traZODone  150 MG tablet Commonly known as: DESYREL                Discharge Care Instructions  (From admission, onward)           Start     Ordered   06/20/24 0000  No dressing needed        06/20/24 0949   06/19/24 0000  Discharge wound care:       Comments: No wound   06/19/24 1022            Discharge Exam: Filed Weights   06/17/24 0500 06/18/24 0500 06/20/24 0500  Weight: 75 kg 73.2 kg 78.1 kg   General exam: Appears calm and comfortable  Respiratory system: Clear to auscultation. Respiratory effort normal. Cardiovascular system: S1 & S2 heard, RRR. No JVD, murmurs, rubs, gallops or clicks. No pedal edema. Gastrointestinal system: Abdomen is nondistended, soft and nontender. No organomegaly or masses felt. Normal bowel sounds heard. Central nervous system: Alert and oriented. No focal neurological deficits. Extremities: Symmetric 5 x 5 power. Skin: No rashes, lesions or ulcers Psychiatry: Judgement and insight appear normal. Mood & affect appropriate.    Condition at discharge: good  The results of significant diagnostics from this hospitalization (including imaging, microbiology, ancillary and laboratory) are listed below for reference.   Imaging Studies: CT CHEST ABDOMEN PELVIS W CONTRAST Result Date: 06/16/2024 CLINICAL DATA:  Found intoxicated, altered mental status, cold exposure, sepsis. EXAM: CT CHEST, ABDOMEN, AND PELVIS WITH CONTRAST TECHNIQUE: Multidetector CT imaging of the chest, abdomen and pelvis was performed following the standard protocol during bolus administration of intravenous contrast. RADIATION DOSE REDUCTION: This exam was performed according to the departmental dose-optimization program which includes automated  exposure control, adjustment of the mA and/or kV according to patient size and/or use of iterative reconstruction technique. CONTRAST:  OMNIPAQUE  IOHEXOL  300 MG/ML  SOLN COMPARISON:  12/22/2014. FINDINGS: CT CHEST FINDINGS Cardiovascular: Atherosclerotic calcification of the aorta with age advanced involvement of the left main and left anterior descending coronary arteries. Heart is at the upper limits of normal in size. No pericardial effusion. Mediastinum/Nodes: No pathologically enlarged mediastinal, hilar or axillary lymph nodes. Esophagus is grossly unremarkable. Lungs/Pleura: Scarring the lateral segment right middle lobe. 3 mm nodule in the superior segment right lower lobe (4/55), unchanged and benign. Lungs are otherwise clear. No pleural fluid. No pneumothorax. Debris in the airway. Musculoskeletal: Old right clavicle, right rib and sternal fractures. No acute fracture. CT ABDOMEN PELVIS FINDINGS Hepatobiliary: Liver is somewhat decreased in attenuation diffusely and the margin is irregular. Associated hypertrophy of the left hepatic lobe and caudate. Liver and gallbladder are otherwise unremarkable. Common bile duct measures 10 mm (coronal image 39), unchanged from 12/22/2014. Pancreas: Pancreatic duct is dilated, measuring 8 mm, also similar to 12/22/2014. No gland atrophy. Spleen: Negative. Adrenals/Urinary Tract: Adrenal glands and kidneys are unremarkable. Ureters are decompressed. Bladder contains a Foley catheter and air and is low in volume. Stomach/Bowel: Tiny hiatal hernia. Stomach, small bowel and colon are unremarkable. Appendectomy. Vascular/Lymphatic: Atherosclerotic calcification of the aorta. No pathologically enlarged lymph nodes. Reproductive: Prostate is visualized. Other: No free fluid. No free air. Mesenteries and peritoneum are unremarkable. Musculoskeletal: Osteopenia. Degenerative changes in the spine. No acute fracture. IMPRESSION: 1. No acute findings. 2. Age advanced left  main and left anterior descending coronary artery calcification. 3. Cirrhosis. 4. Chronic common bile and pancreatic ductal dilatation. 5.  Aortic atherosclerosis (ICD10-I70.0). Electronically Signed   By: Newell Eke M.D.   On: 06/16/2024 16:42   CT Head Wo Contrast Result Date: 06/16/2024 CLINICAL DATA:  Altered mental status. EXAM: CT HEAD WITHOUT CONTRAST TECHNIQUE: Contiguous axial images were obtained from the base of the skull through the vertex without intravenous contrast. RADIATION DOSE REDUCTION: This exam was performed according to the departmental dose-optimization program which includes automated exposure control, adjustment of the mA and/or kV according to patient size and/or use of iterative reconstruction technique. COMPARISON:  July 28, 2021 FINDINGS: Brain: There is mild cerebral atrophy with widening of the extra-axial spaces and ventricular dilatation. There are areas of decreased attenuation within the white matter tracts of the supratentorial brain, consistent with microvascular disease changes. Vascular: No hyperdense vessel or unexpected calcification. Skull: Normal. Negative for fracture or focal lesion. Sinuses/Orbits: A chronic appearing deformity is seen involving the lamina papyracea on the right. Other: None. IMPRESSION: 1. No acute intracranial abnormality. 2. Generalized cerebral atrophy with chronic white matter small vessel ischemic changes. 3. Chronic appearing deformity involving the lamina papyracea on the right. Electronically Signed   By: Suzen Dials M.D.   On: 06/16/2024 16:40   DG Chest Port 1 View Result Date: 06/16/2024 CLINICAL DATA:  Questionable sepsis -  evaluate for abnormality EXAM: PORTABLE CHEST 1 VIEW COMPARISON:  July 06, 2022 FINDINGS: The cardiomediastinal silhouette is unchanged in contour.Atherosclerotic calcifications. Limited evaluation of the apices secondary to overlapping soft tissues. No pleural effusion. No pneumothorax. No acute  pleuroparenchymal abnormality. Several prominent loops of bowel are in the LEFT upper abdomen. IMPRESSION: 1. No acute cardiopulmonary abnormality. 2. Several prominent loops of bowel are in the LEFT upper abdomen. If concern for obstruction, recommend dedicated abdominal radiograph. Electronically Signed   By: Corean Salter M.D.   On: 06/16/2024 15:10    Microbiology: Results for orders placed or performed during the hospital encounter of 06/16/24  Blood Culture (routine x 2)     Status: None (Preliminary result)   Collection Time: 06/16/24  3:24 PM   Specimen: BLOOD  Result Value Ref Range Status   Specimen Description   Final    BLOOD LEFT ANTECUBITAL Performed at [facility], [address] [place], [place] [address]    Special Requests   Final    BOTTLES DRAWN AEROBIC AND ANAEROBIC Blood Culture adequate volume Performed at [facility], [address], [place], [place] [address]    Culture  Setup Time   Final    GRAM POSITIVE RODS AEROBIC BOTTLE ONLY CRITICAL RESULT CALLED TO, READ BACK BY AND VERIFIED WITH:  RANKIN DILLS AT 9597 06/18/24 JG Performed at [facility], [address] [place], [place] [address]    Culture   Final    CULTURE REINCUBATED FOR BETTER GROWTH Performed at [facility], [address] [place], [place] [address]    Report Status PENDING  Incomplete  Blood Culture (routine x 2)     Status: None (Preliminary result)   Collection Time: 06/16/24  3:37 PM   Specimen: BLOOD  Result Value Ref Range Status   Specimen Description BLOOD BLOOD RIGHT HAND  Final   Special Requests   Final    BOTTLES DRAWN AEROBIC ONLY Blood Culture adequate volume   Culture   Final    NO GROWTH 4 DAYS Performed at [facility], [address], [place], [place] [address]    Report Status PENDING  Incomplete  MRSA Next Gen by PCR, Nasal     Status: None   Collection Time: 06/16/24  4:44 PM   Specimen: Nasal Mucosa; Nasal  Swab  Result Value Ref Range Status   MRSA by PCR Next Gen NOT DETECTED NOT DETECTED Final    Comment: (NOTE) The GeneXpert MRSA Assay (FDA approved for NASAL specimens only), is one component of a comprehensive MRSA colonization surveillance program. It is not intended to diagnose MRSA infection nor to guide or monitor treatment for MRSA infections. Test performance is not FDA approved in patients less than 59 years old. Performed at [facility], [address] [place], [place] [address]     Labs: CBC: Recent Labs  Lab 06/16/24 1329 06/17/24 0623 06/18/24 0434  WBC 8.0 7.0 5.4  HGB 14.0 11.8* 11.2*  HCT 43.2 33.6* 32.6*  MCV 103.3* 96.0 99.4  PLT 279 203 174   Basic Metabolic Panel: Recent Labs  Lab 06/16/24 1329 06/17/24 0601 06/18/24 0434 06/20/24 0544  NA 138 137 138 137  K 4.1 4.1 4.2 4.1  CL 98 105 104 104  CO2 13* 22 25 25   GLUCOSE 135* 96 68* 88  BUN 7* 6* 9 12  CREATININE 0.94 0.65 0.78 0.69  CALCIUM  8.7* 8.5* 8.5* 8.6*  MG  --  1.8 2.0 2.1  PHOS  --  3.4 3.7 3.6   Liver Function Tests: Recent Labs  Lab 06/16/24 1329  AST 68*  ALT 33  ALKPHOS 102  BILITOT 0.4  PROT 7.4  ALBUMIN 4.2   CBG: Recent Labs  Lab 06/17/24 0021 06/17/24 0325 06/17/24 0726 06/17/24 1133 06/19/24 2027  GLUCAP 132* 108* 86 152* 116*    Discharge time spent: 35 minutes.  Signed: Murvin Mana, MD [facility] 06/20/2024 "

## 2024-06-20 NOTE — Plan of Care (Signed)

## 2024-06-20 NOTE — TOC Transition Note (Signed)
 Transition of Care [facility]) - Discharge Note   Patient Details  Name: Matthew [name] MRN: [id number] Date of Birth: [birth year]  [facility]) CM/SW Contact:  Lauraine JAYSON Carpen, LCSW Phone Number: 06/20/2024, 9:50 AM   Clinical Narrative:   Patient has orders to discharge home today. No further concerns. CSW signing off.  Final next level of care: Home/Self Care Barriers to Discharge: Barriers Resolved   Patient Goals and CMS Choice            Discharge Placement                Patient to be transferred to facility by: Family/friend   Patient and family notified of of transfer: 06/20/24  Discharge Plan and Services Additional resources added to the After Visit Summary for       Post Acute Care Choice: NA                               Social Drivers of Health (SDOH) Interventions SDOH Screenings   Food Insecurity: Food Insecurity Present (06/17/2024)  Housing: High Risk (06/16/2024)  Transportation Needs: Unmet Transportation Needs (06/17/2024)  Utilities: At Risk (06/17/2024)  Alcohol Screen: High Risk (12/24/2023)  Depression (PHQ2-9): Medium Risk (10/13/2022)  Financial Resource Strain: Low Risk (01/11/2023)  Physical Activity: Insufficiently Active (01/11/2023)  Social Connections: Socially Isolated (01/11/2023)  Stress: No Stress Concern Present (01/11/2023)  Tobacco Use: High Risk (06/16/2024)  Health Literacy: Adequate Health Literacy (01/11/2023)     Readmission Risk Interventions     No data to display

## 2024-06-21 LAB — CULTURE, BLOOD (ROUTINE X 2)
Culture  Setup Time: NO GROWTH
Culture: NO GROWTH
Special Requests: ADEQUATE
Special Requests: ADEQUATE

## 2024-06-21 LAB — INSULIN AND C-PEPTIDE, SERUM
C-Peptide: 2.1 ng/mL (ref 1.1–4.4)
Insulin: 5.8 u[IU]/mL (ref 2.6–24.9)

## 2024-06-21 NOTE — Progress Notes (Signed)
 Removed Phenobarbital  130 mg/ml from Pyxis 06/17/24 at 1630 and wasted 32.8 mg. Waste was not verified by second RN prior to discharge.
